# Patient Record
Sex: Female | Born: 1945
Health system: Southern US, Community
[De-identification: ages and names within clinical notes are randomized; demographics above are authoritative.]

## PROBLEM LIST (undated history)

## (undated) DIAGNOSIS — Z9289 Personal history of other medical treatment: Secondary | ICD-10-CM

## (undated) DIAGNOSIS — J189 Pneumonia, unspecified organism: Secondary | ICD-10-CM

## (undated) DIAGNOSIS — M549 Dorsalgia, unspecified: Secondary | ICD-10-CM

## (undated) DIAGNOSIS — M1990 Unspecified osteoarthritis, unspecified site: Secondary | ICD-10-CM

## (undated) DIAGNOSIS — G8929 Other chronic pain: Secondary | ICD-10-CM

## (undated) DIAGNOSIS — Z860100 Personal history of colon polyps, unspecified: Secondary | ICD-10-CM

## (undated) DIAGNOSIS — D649 Anemia, unspecified: Secondary | ICD-10-CM

## (undated) DIAGNOSIS — E039 Hypothyroidism, unspecified: Secondary | ICD-10-CM

## (undated) DIAGNOSIS — G629 Polyneuropathy, unspecified: Secondary | ICD-10-CM

## (undated) DIAGNOSIS — H269 Unspecified cataract: Secondary | ICD-10-CM

## (undated) DIAGNOSIS — K275 Chronic or unspecified peptic ulcer, site unspecified, with perforation: Secondary | ICD-10-CM

## (undated) DIAGNOSIS — M1712 Unilateral primary osteoarthritis, left knee: Secondary | ICD-10-CM

## (undated) DIAGNOSIS — R51 Headache: Secondary | ICD-10-CM

## (undated) DIAGNOSIS — H409 Unspecified glaucoma: Secondary | ICD-10-CM

## (undated) DIAGNOSIS — R519 Headache, unspecified: Secondary | ICD-10-CM

## (undated) HISTORY — PX: OTHER SURGICAL HISTORY: SHX169

## (undated) HISTORY — PX: COLONOSCOPY WITH ESOPHAGOGASTRODUODENOSCOPY (EGD): SHX5779

## (undated) HISTORY — PX: EPIDURAL BLOCK INJECTION: SHX1516

## (undated) HISTORY — PX: EYE SURGERY: SHX253

## (undated) HISTORY — PX: SPINE SURGERY: SHX786

## (undated) HISTORY — DX: Unspecified cataract: H26.9

## (undated) HISTORY — PX: JOINT REPLACEMENT: SHX530

---
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
## 1963-10-29 HISTORY — PX: APPENDECTOMY: SHX54

## 1963-10-29 HISTORY — PX: OVARIAN CYST REMOVAL: SHX89

## 1971-10-29 HISTORY — PX: ABDOMINAL HYSTERECTOMY: SHX81

## 2001-01-26 ENCOUNTER — Other Ambulatory Visit: Admission: RE | Admit: 2001-01-26 | Discharge: 2001-01-26 | Payer: Self-pay | Admitting: Family Medicine

## 2008-04-12 ENCOUNTER — Encounter: Admission: RE | Admit: 2008-04-12 | Discharge: 2008-04-12 | Payer: Self-pay | Admitting: Orthopedic Surgery

## 2008-06-09 ENCOUNTER — Encounter: Admission: RE | Admit: 2008-06-09 | Discharge: 2008-06-09 | Payer: Self-pay | Admitting: Orthopedic Surgery

## 2008-06-22 ENCOUNTER — Encounter: Admission: RE | Admit: 2008-06-22 | Discharge: 2008-06-22 | Payer: Self-pay | Admitting: Orthopedic Surgery

## 2009-08-22 ENCOUNTER — Encounter: Admission: RE | Admit: 2009-08-22 | Discharge: 2009-08-22 | Payer: Self-pay | Admitting: Orthopedic Surgery

## 2009-09-12 ENCOUNTER — Encounter: Admission: RE | Admit: 2009-09-12 | Discharge: 2009-09-12 | Payer: Self-pay | Admitting: Orthopedic Surgery

## 2010-02-02 ENCOUNTER — Encounter: Admission: RE | Admit: 2010-02-02 | Discharge: 2010-02-02 | Payer: Self-pay | Admitting: Orthopedic Surgery

## 2010-02-07 ENCOUNTER — Encounter: Admission: RE | Admit: 2010-02-07 | Discharge: 2010-02-07 | Payer: Self-pay | Admitting: Orthopedic Surgery

## 2010-06-28 ENCOUNTER — Encounter: Admission: RE | Admit: 2010-06-28 | Discharge: 2010-06-28 | Payer: Self-pay | Admitting: Family Medicine

## 2010-11-18 ENCOUNTER — Encounter: Payer: Self-pay | Admitting: Orthopedic Surgery

## 2010-12-13 ENCOUNTER — Other Ambulatory Visit: Payer: Self-pay | Admitting: Orthopedic Surgery

## 2010-12-13 DIAGNOSIS — M4310 Spondylolisthesis, site unspecified: Secondary | ICD-10-CM

## 2010-12-13 DIAGNOSIS — M47819 Spondylosis without myelopathy or radiculopathy, site unspecified: Secondary | ICD-10-CM

## 2010-12-14 ENCOUNTER — Other Ambulatory Visit: Payer: Self-pay | Admitting: Orthopedic Surgery

## 2010-12-14 ENCOUNTER — Ambulatory Visit
Admission: RE | Admit: 2010-12-14 | Discharge: 2010-12-14 | Disposition: A | Discharge status: Home / Self Care | Payer: Medicare Other | Source: Ambulatory Visit | Attending: Orthopedic Surgery | Admitting: Orthopedic Surgery

## 2010-12-14 DIAGNOSIS — M48061 Spinal stenosis, lumbar region without neurogenic claudication: Secondary | ICD-10-CM

## 2010-12-14 DIAGNOSIS — M47819 Spondylosis without myelopathy or radiculopathy, site unspecified: Secondary | ICD-10-CM

## 2010-12-14 DIAGNOSIS — M4310 Spondylolisthesis, site unspecified: Secondary | ICD-10-CM

## 2010-12-17 ENCOUNTER — Other Ambulatory Visit: Payer: Self-pay | Admitting: Orthopedic Surgery

## 2010-12-17 DIAGNOSIS — M479 Spondylosis, unspecified: Secondary | ICD-10-CM

## 2010-12-31 ENCOUNTER — Other Ambulatory Visit: Payer: Medicare Other

## 2011-09-10 ENCOUNTER — Other Ambulatory Visit: Payer: Self-pay | Admitting: Orthopedic Surgery

## 2011-09-10 DIAGNOSIS — M4310 Spondylolisthesis, site unspecified: Secondary | ICD-10-CM

## 2011-09-10 DIAGNOSIS — M47819 Spondylosis without myelopathy or radiculopathy, site unspecified: Secondary | ICD-10-CM

## 2011-09-10 DIAGNOSIS — M48061 Spinal stenosis, lumbar region without neurogenic claudication: Secondary | ICD-10-CM

## 2011-09-11 ENCOUNTER — Inpatient Hospital Stay: Admission: RE | Admit: 2011-09-11 | Payer: Medicare Other | Source: Ambulatory Visit

## 2011-09-13 ENCOUNTER — Ambulatory Visit
Admission: RE | Admit: 2011-09-13 | Discharge: 2011-09-13 | Disposition: A | Discharge status: Home / Self Care | Payer: Medicare Other | Source: Ambulatory Visit | Attending: Orthopedic Surgery | Admitting: Orthopedic Surgery

## 2011-09-13 DIAGNOSIS — M47819 Spondylosis without myelopathy or radiculopathy, site unspecified: Secondary | ICD-10-CM

## 2011-09-13 DIAGNOSIS — M48061 Spinal stenosis, lumbar region without neurogenic claudication: Secondary | ICD-10-CM

## 2011-09-13 DIAGNOSIS — M4310 Spondylolisthesis, site unspecified: Secondary | ICD-10-CM

## 2011-09-13 MED ORDER — METHYLPREDNISOLONE ACETATE 40 MG/ML INJ SUSP (RADIOLOG
120.0000 mg | Freq: Once | INTRAMUSCULAR | Status: AC
  Administered 2011-09-13: 120 mg via EPIDURAL

## 2011-09-13 MED ORDER — IOHEXOL 180 MG/ML  SOLN
1.0000 mL | Freq: Once | INTRAMUSCULAR | Status: AC | PRN
  Administered 2011-09-13: 1 mL via EPIDURAL

## 2011-09-13 NOTE — Patient Instructions (Signed)

## 2012-02-20 ENCOUNTER — Other Ambulatory Visit: Payer: Self-pay | Admitting: Orthopedic Surgery

## 2012-02-20 DIAGNOSIS — M549 Dorsalgia, unspecified: Secondary | ICD-10-CM

## 2012-02-21 ENCOUNTER — Ambulatory Visit
Admission: RE | Admit: 2012-02-21 | Discharge: 2012-02-21 | Disposition: A | Discharge status: Home / Self Care | Payer: Medicare Other | Source: Ambulatory Visit | Attending: Orthopedic Surgery | Admitting: Orthopedic Surgery

## 2012-02-21 DIAGNOSIS — M549 Dorsalgia, unspecified: Secondary | ICD-10-CM

## 2012-02-21 MED ORDER — IOHEXOL 180 MG/ML  SOLN
1.0000 mL | Freq: Once | INTRAMUSCULAR | Status: AC | PRN
  Administered 2012-02-21: 1 mL via EPIDURAL

## 2012-02-21 MED ORDER — METHYLPREDNISOLONE ACETATE 40 MG/ML INJ SUSP (RADIOLOG
120.0000 mg | Freq: Once | INTRAMUSCULAR | Status: AC
  Administered 2012-02-21: 120 mg via EPIDURAL

## 2012-04-22 ENCOUNTER — Other Ambulatory Visit: Payer: Self-pay | Admitting: Orthopedic Surgery

## 2012-04-22 DIAGNOSIS — M545 Low back pain: Secondary | ICD-10-CM

## 2012-04-22 DIAGNOSIS — M4800 Spinal stenosis, site unspecified: Secondary | ICD-10-CM

## 2012-05-01 ENCOUNTER — Ambulatory Visit
Admission: RE | Admit: 2012-05-01 | Discharge: 2012-05-01 | Disposition: A | Discharge status: Home / Self Care | Payer: Medicare Other | Source: Ambulatory Visit | Attending: Orthopedic Surgery | Admitting: Orthopedic Surgery

## 2012-05-01 VITALS — BP 119/77 | HR 72

## 2012-05-01 DIAGNOSIS — M4800 Spinal stenosis, site unspecified: Secondary | ICD-10-CM

## 2012-05-01 DIAGNOSIS — M545 Low back pain: Secondary | ICD-10-CM

## 2012-05-01 MED ORDER — METHYLPREDNISOLONE ACETATE 40 MG/ML INJ SUSP (RADIOLOG
120.0000 mg | Freq: Once | INTRAMUSCULAR | Status: AC
  Administered 2012-05-01: 120 mg via EPIDURAL

## 2012-05-01 MED ORDER — IOHEXOL 180 MG/ML  SOLN
1.0000 mL | Freq: Once | INTRAMUSCULAR | Status: AC | PRN
  Administered 2012-05-01: 1 mL via EPIDURAL

## 2012-08-03 ENCOUNTER — Other Ambulatory Visit: Payer: Self-pay | Admitting: Orthopedic Surgery

## 2012-08-03 DIAGNOSIS — M549 Dorsalgia, unspecified: Secondary | ICD-10-CM

## 2012-08-06 ENCOUNTER — Ambulatory Visit
Admission: RE | Admit: 2012-08-06 | Discharge: 2012-08-06 | Disposition: A | Discharge status: Home / Self Care | Payer: Medicare Other | Source: Ambulatory Visit | Attending: Orthopedic Surgery | Admitting: Orthopedic Surgery

## 2012-08-06 VITALS — BP 140/55 | HR 66

## 2012-08-06 DIAGNOSIS — M549 Dorsalgia, unspecified: Secondary | ICD-10-CM

## 2012-08-06 MED ORDER — IOHEXOL 180 MG/ML  SOLN
1.0000 mL | Freq: Once | INTRAMUSCULAR | Status: AC | PRN
  Administered 2012-08-06: 1 mL via EPIDURAL

## 2012-08-06 MED ORDER — METHYLPREDNISOLONE ACETATE 40 MG/ML INJ SUSP (RADIOLOG
120.0000 mg | Freq: Once | INTRAMUSCULAR | Status: AC
  Administered 2012-08-06: 120 mg via EPIDURAL

## 2012-09-28 ENCOUNTER — Ambulatory Visit: Payer: Medicare Other | Attending: Orthopedic Surgery | Admitting: Physical Therapy

## 2012-09-28 DIAGNOSIS — M545 Low back pain, unspecified: Secondary | ICD-10-CM | POA: Insufficient documentation

## 2012-09-28 DIAGNOSIS — M542 Cervicalgia: Secondary | ICD-10-CM | POA: Insufficient documentation

## 2012-09-28 DIAGNOSIS — M629 Disorder of muscle, unspecified: Secondary | ICD-10-CM | POA: Insufficient documentation

## 2012-09-28 DIAGNOSIS — IMO0001 Reserved for inherently not codable concepts without codable children: Secondary | ICD-10-CM | POA: Insufficient documentation

## 2012-09-28 DIAGNOSIS — M2569 Stiffness of other specified joint, not elsewhere classified: Secondary | ICD-10-CM | POA: Insufficient documentation

## 2012-09-28 DIAGNOSIS — M2420 Disorder of ligament, unspecified site: Secondary | ICD-10-CM | POA: Insufficient documentation

## 2012-09-30 ENCOUNTER — Ambulatory Visit: Payer: Medicare Other | Admitting: Physical Therapy

## 2012-10-06 ENCOUNTER — Ambulatory Visit: Payer: Medicare Other | Admitting: Physical Therapy

## 2012-10-08 ENCOUNTER — Ambulatory Visit: Payer: Medicare Other | Admitting: Physical Therapy

## 2012-10-13 ENCOUNTER — Ambulatory Visit: Payer: Medicare Other | Admitting: Physical Therapy

## 2012-10-15 ENCOUNTER — Ambulatory Visit: Payer: Medicare Other | Admitting: Physical Therapy

## 2012-10-20 ENCOUNTER — Ambulatory Visit: Payer: Medicare Other | Admitting: Physical Therapy

## 2012-10-22 ENCOUNTER — Ambulatory Visit: Payer: Medicare Other | Admitting: Physical Therapy

## 2012-10-29 ENCOUNTER — Ambulatory Visit: Payer: Medicare Other | Attending: Orthopedic Surgery | Admitting: Physical Therapy

## 2012-10-29 DIAGNOSIS — M545 Low back pain, unspecified: Secondary | ICD-10-CM | POA: Insufficient documentation

## 2012-10-29 DIAGNOSIS — M542 Cervicalgia: Secondary | ICD-10-CM | POA: Insufficient documentation

## 2012-10-29 DIAGNOSIS — M2420 Disorder of ligament, unspecified site: Secondary | ICD-10-CM | POA: Insufficient documentation

## 2012-10-29 DIAGNOSIS — M2569 Stiffness of other specified joint, not elsewhere classified: Secondary | ICD-10-CM | POA: Insufficient documentation

## 2012-10-29 DIAGNOSIS — IMO0001 Reserved for inherently not codable concepts without codable children: Secondary | ICD-10-CM | POA: Insufficient documentation

## 2012-10-29 DIAGNOSIS — M629 Disorder of muscle, unspecified: Secondary | ICD-10-CM | POA: Insufficient documentation

## 2012-11-03 ENCOUNTER — Ambulatory Visit: Payer: Medicare Other | Attending: Orthopedic Surgery | Admitting: Physical Therapy

## 2012-11-03 DIAGNOSIS — M545 Low back pain, unspecified: Secondary | ICD-10-CM | POA: Insufficient documentation

## 2012-11-03 DIAGNOSIS — IMO0001 Reserved for inherently not codable concepts without codable children: Secondary | ICD-10-CM | POA: Insufficient documentation

## 2012-11-03 DIAGNOSIS — M2569 Stiffness of other specified joint, not elsewhere classified: Secondary | ICD-10-CM | POA: Insufficient documentation

## 2012-11-03 DIAGNOSIS — M542 Cervicalgia: Secondary | ICD-10-CM | POA: Insufficient documentation

## 2012-11-03 DIAGNOSIS — M2420 Disorder of ligament, unspecified site: Secondary | ICD-10-CM | POA: Insufficient documentation

## 2012-11-03 DIAGNOSIS — M629 Disorder of muscle, unspecified: Secondary | ICD-10-CM | POA: Insufficient documentation

## 2012-11-10 ENCOUNTER — Ambulatory Visit: Payer: Medicare Other | Admitting: Physical Therapy

## 2012-11-17 ENCOUNTER — Encounter: Payer: Medicare Other | Admitting: Physical Therapy

## 2012-11-24 ENCOUNTER — Encounter: Payer: Medicare Other | Admitting: Physical Therapy

## 2014-01-26 ENCOUNTER — Other Ambulatory Visit: Payer: Self-pay | Admitting: Orthopedic Surgery

## 2014-01-26 DIAGNOSIS — M549 Dorsalgia, unspecified: Secondary | ICD-10-CM

## 2014-01-31 ENCOUNTER — Other Ambulatory Visit: Payer: Commercial Managed Care - HMO

## 2014-02-10 ENCOUNTER — Ambulatory Visit
Admission: RE | Admit: 2014-02-10 | Discharge: 2014-02-10 | Disposition: A | Discharge status: Home / Self Care | Payer: Commercial Managed Care - HMO | Source: Ambulatory Visit | Attending: Orthopedic Surgery | Admitting: Orthopedic Surgery

## 2014-02-10 DIAGNOSIS — M549 Dorsalgia, unspecified: Secondary | ICD-10-CM

## 2014-02-10 MED ORDER — METHYLPREDNISOLONE ACETATE 40 MG/ML INJ SUSP (RADIOLOG
120.0000 mg | Freq: Once | INTRAMUSCULAR | Status: AC
  Administered 2014-02-10: 120 mg via EPIDURAL

## 2014-02-10 MED ORDER — IOHEXOL 180 MG/ML  SOLN
1.0000 mL | Freq: Once | INTRAMUSCULAR | Status: AC | PRN
  Administered 2014-02-10: 1 mL via EPIDURAL

## 2014-02-10 NOTE — Discharge Instructions (Signed)

## 2014-06-01 ENCOUNTER — Other Ambulatory Visit: Payer: Self-pay | Admitting: Orthopedic Surgery

## 2014-06-01 DIAGNOSIS — M545 Low back pain: Secondary | ICD-10-CM

## 2014-06-07 ENCOUNTER — Ambulatory Visit
Admission: RE | Admit: 2014-06-07 | Discharge: 2014-06-07 | Disposition: A | Discharge status: Home / Self Care | Payer: Commercial Managed Care - HMO | Source: Ambulatory Visit | Attending: Orthopedic Surgery | Admitting: Orthopedic Surgery

## 2014-06-07 DIAGNOSIS — M545 Low back pain: Secondary | ICD-10-CM

## 2014-06-07 MED ORDER — METHYLPREDNISOLONE ACETATE 40 MG/ML INJ SUSP (RADIOLOG
120.0000 mg | Freq: Once | INTRAMUSCULAR | Status: AC
Start: 2014-06-07 — End: 2014-06-07
  Administered 2014-06-07: 120 mg via EPIDURAL

## 2014-06-07 MED ORDER — IOHEXOL 180 MG/ML  SOLN
1.0000 mL | Freq: Once | INTRAMUSCULAR | Status: AC | PRN
  Administered 2014-06-07: 1 mL via EPIDURAL

## 2014-06-07 NOTE — Discharge Instructions (Signed)

## 2014-07-06 ENCOUNTER — Ambulatory Visit: Payer: Medicare HMO | Attending: Family Medicine | Admitting: Physical Therapy

## 2014-07-06 DIAGNOSIS — Z9089 Acquired absence of other organs: Secondary | ICD-10-CM | POA: Diagnosis not present

## 2014-07-06 DIAGNOSIS — M2560 Stiffness of unspecified joint, not elsewhere classified: Secondary | ICD-10-CM | POA: Diagnosis not present

## 2014-07-06 DIAGNOSIS — R5381 Other malaise: Secondary | ICD-10-CM | POA: Insufficient documentation

## 2014-07-06 DIAGNOSIS — E039 Hypothyroidism, unspecified: Secondary | ICD-10-CM | POA: Diagnosis not present

## 2014-07-06 DIAGNOSIS — M542 Cervicalgia: Secondary | ICD-10-CM | POA: Insufficient documentation

## 2014-07-06 DIAGNOSIS — Z90710 Acquired absence of both cervix and uterus: Secondary | ICD-10-CM | POA: Diagnosis not present

## 2014-07-06 DIAGNOSIS — IMO0001 Reserved for inherently not codable concepts without codable children: Secondary | ICD-10-CM | POA: Diagnosis present

## 2014-07-18 ENCOUNTER — Ambulatory Visit: Payer: Medicare HMO | Admitting: Physical Therapy

## 2014-07-18 DIAGNOSIS — IMO0001 Reserved for inherently not codable concepts without codable children: Secondary | ICD-10-CM | POA: Diagnosis not present

## 2014-07-21 ENCOUNTER — Ambulatory Visit: Payer: Medicare HMO | Admitting: Physical Therapy

## 2014-07-21 DIAGNOSIS — IMO0001 Reserved for inherently not codable concepts without codable children: Secondary | ICD-10-CM | POA: Diagnosis not present

## 2014-07-27 ENCOUNTER — Ambulatory Visit: Payer: Medicare HMO | Admitting: Physical Therapy

## 2014-07-27 DIAGNOSIS — IMO0001 Reserved for inherently not codable concepts without codable children: Secondary | ICD-10-CM | POA: Diagnosis not present

## 2014-08-03 ENCOUNTER — Ambulatory Visit: Payer: Medicare HMO | Attending: Family Medicine | Admitting: Physical Therapy

## 2014-08-03 DIAGNOSIS — M2560 Stiffness of unspecified joint, not elsewhere classified: Secondary | ICD-10-CM | POA: Diagnosis not present

## 2014-08-03 DIAGNOSIS — M542 Cervicalgia: Secondary | ICD-10-CM | POA: Diagnosis not present

## 2014-08-03 DIAGNOSIS — Z5189 Encounter for other specified aftercare: Secondary | ICD-10-CM | POA: Insufficient documentation

## 2014-08-03 DIAGNOSIS — Z90710 Acquired absence of both cervix and uterus: Secondary | ICD-10-CM | POA: Insufficient documentation

## 2014-08-03 DIAGNOSIS — E039 Hypothyroidism, unspecified: Secondary | ICD-10-CM | POA: Insufficient documentation

## 2014-08-03 DIAGNOSIS — R5381 Other malaise: Secondary | ICD-10-CM | POA: Insufficient documentation

## 2014-08-03 DIAGNOSIS — Q428 Congenital absence, atresia and stenosis of other parts of large intestine: Secondary | ICD-10-CM | POA: Diagnosis not present

## 2014-08-12 ENCOUNTER — Ambulatory Visit: Payer: Medicare HMO | Admitting: Physical Therapy

## 2014-08-12 DIAGNOSIS — Z5189 Encounter for other specified aftercare: Secondary | ICD-10-CM | POA: Diagnosis not present

## 2014-08-17 ENCOUNTER — Ambulatory Visit: Payer: Medicare HMO | Admitting: Physical Therapy

## 2014-08-22 ENCOUNTER — Ambulatory Visit: Payer: Medicare HMO | Admitting: Physical Therapy

## 2014-08-22 DIAGNOSIS — Z5189 Encounter for other specified aftercare: Secondary | ICD-10-CM | POA: Diagnosis not present

## 2014-09-02 ENCOUNTER — Ambulatory Visit: Payer: Medicare HMO | Attending: Family Medicine | Admitting: Physical Therapy

## 2014-09-02 DIAGNOSIS — M542 Cervicalgia: Secondary | ICD-10-CM | POA: Diagnosis not present

## 2014-09-02 DIAGNOSIS — M2560 Stiffness of unspecified joint, not elsewhere classified: Secondary | ICD-10-CM | POA: Insufficient documentation

## 2014-09-02 DIAGNOSIS — Z90710 Acquired absence of both cervix and uterus: Secondary | ICD-10-CM | POA: Diagnosis not present

## 2014-09-02 DIAGNOSIS — Q428 Congenital absence, atresia and stenosis of other parts of large intestine: Secondary | ICD-10-CM | POA: Insufficient documentation

## 2014-09-02 DIAGNOSIS — R5381 Other malaise: Secondary | ICD-10-CM | POA: Diagnosis not present

## 2014-09-02 DIAGNOSIS — E039 Hypothyroidism, unspecified: Secondary | ICD-10-CM | POA: Diagnosis not present

## 2014-09-02 DIAGNOSIS — Z5189 Encounter for other specified aftercare: Secondary | ICD-10-CM | POA: Diagnosis present

## 2014-09-09 ENCOUNTER — Ambulatory Visit: Payer: Medicare HMO | Admitting: Physical Therapy

## 2014-09-09 DIAGNOSIS — Z5189 Encounter for other specified aftercare: Secondary | ICD-10-CM | POA: Diagnosis not present

## 2014-09-13 ENCOUNTER — Other Ambulatory Visit: Payer: Self-pay | Admitting: Orthopedic Surgery

## 2014-09-13 DIAGNOSIS — M5416 Radiculopathy, lumbar region: Secondary | ICD-10-CM

## 2014-09-21 ENCOUNTER — Ambulatory Visit
Admission: RE | Admit: 2014-09-21 | Discharge: 2014-09-21 | Disposition: A | Discharge status: Home / Self Care | Payer: Commercial Managed Care - HMO | Source: Ambulatory Visit | Attending: Orthopedic Surgery | Admitting: Orthopedic Surgery

## 2014-09-21 DIAGNOSIS — M5416 Radiculopathy, lumbar region: Secondary | ICD-10-CM

## 2014-09-21 MED ORDER — METHYLPREDNISOLONE ACETATE 40 MG/ML INJ SUSP (RADIOLOG
120.0000 mg | Freq: Once | INTRAMUSCULAR | Status: AC
  Administered 2014-09-21: 120 mg via EPIDURAL

## 2014-09-21 MED ORDER — IOHEXOL 180 MG/ML  SOLN
1.0000 mL | Freq: Once | INTRAMUSCULAR | Status: AC | PRN
  Administered 2014-09-21: 1 mL via EPIDURAL

## 2015-04-25 ENCOUNTER — Other Ambulatory Visit: Payer: Self-pay | Admitting: Orthopedic Surgery

## 2015-04-25 DIAGNOSIS — M545 Low back pain: Secondary | ICD-10-CM

## 2015-04-26 ENCOUNTER — Ambulatory Visit
Admission: RE | Admit: 2015-04-26 | Discharge: 2015-04-26 | Disposition: A | Discharge status: Home / Self Care | Payer: Medicare HMO | Source: Ambulatory Visit | Attending: Orthopedic Surgery | Admitting: Orthopedic Surgery

## 2015-04-26 DIAGNOSIS — M545 Low back pain: Secondary | ICD-10-CM

## 2015-04-26 MED ORDER — METHYLPREDNISOLONE ACETATE 40 MG/ML INJ SUSP (RADIOLOG
120.0000 mg | Freq: Once | INTRAMUSCULAR | Status: AC
  Administered 2015-04-26: 120 mg via EPIDURAL

## 2015-04-26 MED ORDER — IOHEXOL 180 MG/ML  SOLN
1.0000 mL | Freq: Once | INTRAMUSCULAR | Status: AC | PRN
  Administered 2015-04-26: 1 mL via EPIDURAL

## 2015-10-13 ENCOUNTER — Other Ambulatory Visit: Payer: Self-pay | Admitting: Nurse Practitioner

## 2015-10-13 DIAGNOSIS — R1011 Right upper quadrant pain: Principal | ICD-10-CM

## 2015-10-13 DIAGNOSIS — G8929 Other chronic pain: Secondary | ICD-10-CM

## 2015-10-25 ENCOUNTER — Ambulatory Visit
Admission: RE | Admit: 2015-10-25 | Discharge: 2015-10-25 | Disposition: A | Discharge status: Home / Self Care | Payer: Medicare HMO | Source: Ambulatory Visit | Attending: Nurse Practitioner | Admitting: Nurse Practitioner

## 2015-10-25 ENCOUNTER — Other Ambulatory Visit: Payer: Medicare HMO

## 2015-10-25 DIAGNOSIS — R1011 Right upper quadrant pain: Principal | ICD-10-CM

## 2015-10-25 DIAGNOSIS — G8929 Other chronic pain: Secondary | ICD-10-CM

## 2016-09-17 ENCOUNTER — Other Ambulatory Visit: Payer: Self-pay | Admitting: Orthopedic Surgery

## 2016-09-17 DIAGNOSIS — M48061 Spinal stenosis, lumbar region without neurogenic claudication: Secondary | ICD-10-CM

## 2016-09-26 ENCOUNTER — Ambulatory Visit
Admission: RE | Admit: 2016-09-26 | Discharge: 2016-09-26 | Disposition: A | Discharge status: Home / Self Care | Payer: Medicare HMO | Source: Ambulatory Visit | Attending: Orthopedic Surgery | Admitting: Orthopedic Surgery

## 2016-09-26 DIAGNOSIS — M48061 Spinal stenosis, lumbar region without neurogenic claudication: Secondary | ICD-10-CM

## 2016-09-26 MED ORDER — IOPAMIDOL (ISOVUE-M 200) INJECTION 41%
1.0000 mL | Freq: Once | INTRAMUSCULAR | Status: AC
  Administered 2016-09-26: 1 mL via EPIDURAL

## 2016-09-26 MED ORDER — METHYLPREDNISOLONE ACETATE 40 MG/ML INJ SUSP (RADIOLOG
120.0000 mg | Freq: Once | INTRAMUSCULAR | Status: AC
  Administered 2016-09-26: 120 mg via EPIDURAL

## 2016-09-26 NOTE — Discharge Instructions (Signed)

## 2016-10-28 HISTORY — PX: COLON SURGERY: SHX602

## 2016-10-29 ENCOUNTER — Other Ambulatory Visit: Payer: Self-pay | Admitting: Orthopedic Surgery

## 2016-10-29 DIAGNOSIS — M48061 Spinal stenosis, lumbar region without neurogenic claudication: Secondary | ICD-10-CM

## 2016-11-08 ENCOUNTER — Ambulatory Visit
Admission: RE | Admit: 2016-11-08 | Discharge: 2016-11-08 | Disposition: A | Discharge status: Home / Self Care | Payer: Medicare HMO | Source: Ambulatory Visit | Attending: Orthopedic Surgery | Admitting: Orthopedic Surgery

## 2016-11-08 DIAGNOSIS — M48061 Spinal stenosis, lumbar region without neurogenic claudication: Secondary | ICD-10-CM

## 2016-11-08 MED ORDER — IOPAMIDOL (ISOVUE-M 200) INJECTION 41%
1.0000 mL | Freq: Once | INTRAMUSCULAR | Status: AC
  Administered 2016-11-08: 1 mL via EPIDURAL

## 2016-11-08 MED ORDER — METHYLPREDNISOLONE ACETATE 40 MG/ML INJ SUSP (RADIOLOG
120.0000 mg | Freq: Once | INTRAMUSCULAR | Status: AC
  Administered 2016-11-08: 120 mg via EPIDURAL

## 2016-11-08 NOTE — Discharge Instructions (Signed)

## 2016-11-15 LAB — HM DEXA SCAN

## 2016-12-23 ENCOUNTER — Encounter: Payer: Self-pay | Admitting: Internal Medicine

## 2016-12-23 LAB — HM COLONOSCOPY

## 2017-01-02 ENCOUNTER — Other Ambulatory Visit: Payer: Self-pay | Admitting: Orthopedic Surgery

## 2017-01-02 DIAGNOSIS — M545 Low back pain, unspecified: Secondary | ICD-10-CM

## 2017-01-10 ENCOUNTER — Other Ambulatory Visit: Payer: Medicare HMO

## 2017-01-11 ENCOUNTER — Ambulatory Visit
Admission: RE | Admit: 2017-01-11 | Discharge: 2017-01-11 | Disposition: A | Discharge status: Home / Self Care | Payer: Medicare HMO | Source: Ambulatory Visit | Attending: Orthopedic Surgery | Admitting: Orthopedic Surgery

## 2017-01-11 DIAGNOSIS — M545 Low back pain, unspecified: Secondary | ICD-10-CM

## 2017-02-20 ENCOUNTER — Other Ambulatory Visit: Payer: Self-pay | Admitting: Neurological Surgery

## 2017-02-25 ENCOUNTER — Encounter (HOSPITAL_COMMUNITY)
Admission: RE | Admit: 2017-02-25 | Discharge: 2017-02-25 | Disposition: A | Discharge status: Home / Self Care | Payer: Medicare HMO | Source: Ambulatory Visit | Attending: Neurological Surgery | Admitting: Neurological Surgery

## 2017-02-25 ENCOUNTER — Encounter (HOSPITAL_COMMUNITY): Payer: Self-pay

## 2017-02-25 DIAGNOSIS — Z981 Arthrodesis status: Secondary | ICD-10-CM | POA: Insufficient documentation

## 2017-02-25 HISTORY — DX: Headache, unspecified: R51.9

## 2017-02-25 HISTORY — DX: Pneumonia, unspecified organism: J18.9

## 2017-02-25 HISTORY — DX: Dorsalgia, unspecified: M54.9

## 2017-02-25 HISTORY — DX: Unspecified glaucoma: H40.9

## 2017-02-25 HISTORY — DX: Polyneuropathy, unspecified: G62.9

## 2017-02-25 HISTORY — DX: Chronic or unspecified peptic ulcer, site unspecified, with perforation: K27.5

## 2017-02-25 HISTORY — DX: Personal history of colonic polyps: Z86.010

## 2017-02-25 HISTORY — DX: Personal history of other medical treatment: Z92.89

## 2017-02-25 HISTORY — DX: Other chronic pain: G89.29

## 2017-02-25 HISTORY — DX: Hypothyroidism, unspecified: E03.9

## 2017-02-25 HISTORY — DX: Personal history of colon polyps, unspecified: Z86.0100

## 2017-02-25 HISTORY — DX: Headache: R51

## 2017-02-25 LAB — CBC
HCT: 45.2 % (ref 36.0–46.0)
Hemoglobin: 15.1 g/dL — ABNORMAL HIGH (ref 12.0–15.0)
MCH: 29.6 pg (ref 26.0–34.0)
MCHC: 33.4 g/dL (ref 30.0–36.0)
MCV: 88.6 fL (ref 78.0–100.0)
PLATELETS: 302 10*3/uL (ref 150–400)
RBC: 5.1 MIL/uL (ref 3.87–5.11)
RDW: 13.8 % (ref 11.5–15.5)
WBC: 9.4 10*3/uL (ref 4.0–10.5)

## 2017-02-25 LAB — SURGICAL PCR SCREEN
MRSA, PCR: NEGATIVE
Staphylococcus aureus: NEGATIVE

## 2017-02-25 LAB — BASIC METABOLIC PANEL
Anion gap: 8 (ref 5–15)
BUN: 10 mg/dL (ref 6–20)
CALCIUM: 9.9 mg/dL (ref 8.9–10.3)
CHLORIDE: 103 mmol/L (ref 101–111)
CO2: 29 mmol/L (ref 22–32)
CREATININE: 0.65 mg/dL (ref 0.44–1.00)
GFR calc non Af Amer: >60 mL/min (ref >60)
GLUCOSE: 99 mg/dL (ref 65–99)
Potassium: 4.1 mmol/L (ref 3.5–5.1)
Sodium: 140 mmol/L (ref 135–145)

## 2017-02-25 LAB — TYPE AND SCREEN
ABO/RH(D): O POS
ANTIBODY SCREEN: NEGATIVE

## 2017-02-25 LAB — ABO/RH: ABO/RH(D): O POS

## 2017-02-25 MED ORDER — CHLORHEXIDINE GLUCONATE CLOTH 2 % EX PADS: 6.0000 | MEDICATED_PAD | Freq: Once | CUTANEOUS | Status: DC

## 2017-02-25 NOTE — Progress Notes (Addendum)
Cardiologist denies  Medical Md is Dr.Jennifer Sabon in [place]  Echo denies  Stress test denies  Heart cath denies

## 2017-02-25 NOTE — Pre-Procedure Instructions (Signed)
MICIAH SHEALY  02/25/2017      [facility] - [place], [place] - [address] [place] [address] Phone: [phone] Fax: [phone]    Your procedure is scheduled on Fri, May 4 @ 7:30 AM  Report to [facility] Admitting at 5:30 AM  Call this number if you have problems the morning of surgery:  [phone]   Remember:  Do not eat food or drink liquids after midnight.  Take these medicines the morning of surgery with A SIP OF WATER Zantac(Ranitidine) and Thyroid(NP Thyroid)              No Goody's,BC's,Aleve,Advil,Motrin,Ibuprofen,Aspirin,Fish Oil,or any Herbal Medications.    Do not wear jewelry, make-up or nail polish.  Do not wear lotions, powders, perfumes, or deoderant.  Do not shave 48 hours prior to surgery.    Do not bring valuables to the hospital.  [facility] is not responsible for any belongings or valuables.  Contacts, dentures or bridgework may not be worn into surgery.  Leave your suitcase in the car.  After surgery it may be brought to your room.  For patients admitted to the hospital, discharge time will be determined by your treatment team.  Patients discharged the day of surgery will not be allowed to drive home.    [facility]  Before surgery, you can play an important role.  Because skin is not sterile, your skin needs to be as free of germs as possible.  You can reduce the number of germs on you skin by washing with CHG (chlorahexidine gluconate) soap before surgery.  CHG is an antiseptic cleaner which kills germs and bonds with the skin to continue killing germs even after washing.  Please DO NOT use if you have an allergy to CHG or antibacterial soaps.  If your skin becomes reddened/irritated stop using the CHG and inform your nurse when you arrive at Short Stay.  Do not shave (including legs and underarms) for at least 48 hours prior to the first CHG  shower.  You may shave your face.  Please follow these instructions carefully:   1.  Shower with CHG Soap the night before surgery and the                                morning of Surgery.  2.  If you choose to wash your hair, wash your hair first as usual with your       normal shampoo.  3.  After you shampoo, rinse your hair and body thoroughly to remove the                      Shampoo.  4.  Use CHG as you would any other liquid soap.  You can apply chg directly       to the skin and wash gently with scrungie or a clean washcloth.  5.  Apply the CHG Soap to your body ONLY FROM THE NECK DOWN.        Do not use on open wounds or open sores.  Avoid contact with your eyes,       ears, mouth and genitals (private parts).  Wash genitals (private parts)       with your normal soap.  6.  Wash thoroughly, paying special attention to the area where your surgery  will be performed.  7.  Thoroughly rinse your body with warm water from the neck down.  8.  DO NOT shower/wash with your normal soap after using and rinsing off       the CHG Soap.  9.  Pat yourself dry with a clean towel.            10.  Wear clean pajamas.            11.  Place clean sheets on your bed the night of your first shower and do not        sleep with pets.  Day of Surgery  Do not apply any lotions/deoderants the morning of surgery.  Please wear clean clothes to the hospital/surgery center.      Please read over the following fact sheets that you were given. Pain Booklet, Coughing and Deep Breathing, MRSA Information and Surgical Site Infection Prevention

## 2017-02-26 ENCOUNTER — Ambulatory Visit (HOSPITAL_COMMUNITY)
Admission: RE | Admit: 2017-02-26 | Discharge: 2017-02-26 | Disposition: A | Discharge status: Home / Self Care | Payer: Medicare HMO | Source: Ambulatory Visit | Attending: Neurological Surgery | Admitting: Neurological Surgery

## 2017-02-26 ENCOUNTER — Other Ambulatory Visit (HOSPITAL_COMMUNITY): Payer: Self-pay | Admitting: Neurological Surgery

## 2017-02-26 DIAGNOSIS — M48061 Spinal stenosis, lumbar region without neurogenic claudication: Secondary | ICD-10-CM | POA: Insufficient documentation

## 2017-02-26 DIAGNOSIS — M2578 Osteophyte, vertebrae: Secondary | ICD-10-CM

## 2017-02-26 DIAGNOSIS — M4727 Other spondylosis with radiculopathy, lumbosacral region: Secondary | ICD-10-CM | POA: Insufficient documentation

## 2017-02-26 DIAGNOSIS — M4316 Spondylolisthesis, lumbar region: Secondary | ICD-10-CM

## 2017-02-27 MED ORDER — CEFAZOLIN SODIUM-DEXTROSE 2-4 GM/100ML-% IV SOLN
2.0000 g | INTRAVENOUS | Status: AC
  Administered 2017-02-28: 2 g via INTRAVENOUS
  Filled 2017-02-27: qty 100

## 2017-02-28 ENCOUNTER — Inpatient Hospital Stay (HOSPITAL_COMMUNITY): Payer: Medicare HMO | Admitting: Anesthesiology

## 2017-02-28 ENCOUNTER — Inpatient Hospital Stay (HOSPITAL_COMMUNITY): Payer: Medicare HMO

## 2017-02-28 ENCOUNTER — Encounter (HOSPITAL_COMMUNITY)
Admission: RE | Disposition: A | Discharge status: Home / Self Care | Payer: Self-pay | Source: Ambulatory Visit | Attending: Neurological Surgery

## 2017-02-28 ENCOUNTER — Inpatient Hospital Stay (HOSPITAL_COMMUNITY)
Admission: RE | Admit: 2017-02-28 | Discharge: 2017-03-02 | DRG: 455 | Disposition: A | Discharge status: Home / Self Care | Payer: Medicare HMO | Source: Ambulatory Visit | Attending: Neurological Surgery | Admitting: Neurological Surgery

## 2017-02-28 ENCOUNTER — Encounter (HOSPITAL_COMMUNITY): Payer: Self-pay | Admitting: Anesthesiology

## 2017-02-28 DIAGNOSIS — M5416 Radiculopathy, lumbar region: Secondary | ICD-10-CM | POA: Diagnosis present

## 2017-02-28 DIAGNOSIS — E039 Hypothyroidism, unspecified: Secondary | ICD-10-CM | POA: Diagnosis present

## 2017-02-28 DIAGNOSIS — M4317 Spondylolisthesis, lumbosacral region: Secondary | ICD-10-CM | POA: Diagnosis present

## 2017-02-28 DIAGNOSIS — M4316 Spondylolisthesis, lumbar region: Secondary | ICD-10-CM | POA: Diagnosis present

## 2017-02-28 DIAGNOSIS — H409 Unspecified glaucoma: Secondary | ICD-10-CM | POA: Diagnosis present

## 2017-02-28 DIAGNOSIS — Z87891 Personal history of nicotine dependence: Secondary | ICD-10-CM

## 2017-02-28 DIAGNOSIS — Z419 Encounter for procedure for purposes other than remedying health state, unspecified: Secondary | ICD-10-CM

## 2017-02-28 HISTORY — PX: APPLICATION OF ROBOTIC ASSISTANCE FOR SPINAL PROCEDURE: SHX6753

## 2017-02-28 HISTORY — PX: ANTERIOR LAT LUMBAR FUSION: SHX1168

## 2017-02-28 HISTORY — PX: LUMBAR PERCUTANEOUS PEDICLE SCREW 1 LEVEL: SHX5560

## 2017-02-28 SURGERY — ANTERIOR LATERAL LUMBAR FUSION 1 LEVEL
Anesthesia: General

## 2017-02-28 MED ORDER — ONDANSETRON HCL 4 MG/2ML IJ SOLN: 4.0000 mg | Freq: Four times a day (QID) | INTRAMUSCULAR | Status: DC | PRN

## 2017-02-28 MED ORDER — PROPOFOL 10 MG/ML IV BOLUS
INTRAVENOUS | Status: AC
  Filled 2017-02-28: qty 20

## 2017-02-28 MED ORDER — OXYCODONE HCL 5 MG/5ML PO SOLN: 5.0000 mg | Freq: Once | ORAL | Status: DC | PRN

## 2017-02-28 MED ORDER — ONDANSETRON HCL 4 MG/2ML IJ SOLN
INTRAMUSCULAR | Status: AC
  Filled 2017-02-28: qty 2

## 2017-02-28 MED ORDER — EPHEDRINE 5 MG/ML INJ
INTRAVENOUS | Status: AC
  Filled 2017-02-28: qty 10

## 2017-02-28 MED ORDER — SUCCINYLCHOLINE CHLORIDE 20 MG/ML IJ SOLN
INTRAMUSCULAR | Status: DC | PRN
  Administered 2017-02-28: 60 mg via INTRAVENOUS

## 2017-02-28 MED ORDER — SODIUM CHLORIDE 0.9 % IR SOLN
Status: DC | PRN
  Administered 2017-02-28: 07:00:00

## 2017-02-28 MED ORDER — PROPOFOL 1000 MG/100ML IV EMUL
INTRAVENOUS | Status: AC
  Filled 2017-02-28: qty 100

## 2017-02-28 MED ORDER — OXYCODONE HCL ER 20 MG PO T12A
20.0000 mg | EXTENDED_RELEASE_TABLET | Freq: Two times a day (BID) | ORAL | Status: DC
  Administered 2017-02-28 – 2017-03-02 (×4): 20 mg via ORAL
  Filled 2017-02-28 (×4): qty 1

## 2017-02-28 MED ORDER — PHENYLEPHRINE 40 MCG/ML (10ML) SYRINGE FOR IV PUSH (FOR BLOOD PRESSURE SUPPORT)
PREFILLED_SYRINGE | INTRAVENOUS | Status: AC
  Filled 2017-02-28: qty 10

## 2017-02-28 MED ORDER — PANTOPRAZOLE SODIUM 40 MG IV SOLR
40.0000 mg | Freq: Every day | INTRAVENOUS | Status: DC
  Administered 2017-02-28: 40 mg via INTRAVENOUS
  Filled 2017-02-28: qty 40

## 2017-02-28 MED ORDER — HYDROMORPHONE HCL 1 MG/ML IJ SOLN
INTRAMUSCULAR | Status: AC
  Administered 2017-02-28: 0.5 mg via INTRAVENOUS
  Filled 2017-02-28: qty 0.5

## 2017-02-28 MED ORDER — HYDROMORPHONE HCL 1 MG/ML IJ SOLN
INTRAMUSCULAR | Status: AC
  Filled 2017-02-28: qty 0.5

## 2017-02-28 MED ORDER — ADULT MULTIVITAMIN W/MINERALS CH
1.0000 | ORAL_TABLET | Freq: Every day | ORAL | Status: DC
  Administered 2017-02-28 – 2017-03-02 (×3): 1 via ORAL
  Filled 2017-02-28 (×3): qty 1

## 2017-02-28 MED ORDER — ACETAMINOPHEN 10 MG/ML IV SOLN
INTRAVENOUS | Status: DC | PRN
  Administered 2017-02-28: 1000 mg via INTRAVENOUS

## 2017-02-28 MED ORDER — SUGAMMADEX SODIUM 200 MG/2ML IV SOLN
INTRAVENOUS | Status: AC
  Filled 2017-02-28: qty 2

## 2017-02-28 MED ORDER — SUGAMMADEX SODIUM 200 MG/2ML IV SOLN
INTRAVENOUS | Status: DC | PRN
Start: 2017-02-28 — End: 2017-02-28
  Administered 2017-02-28: 150 mg via INTRAVENOUS

## 2017-02-28 MED ORDER — PHENOL 1.4 % MT LIQD: 1.0000 | OROMUCOSAL | Status: DC | PRN

## 2017-02-28 MED ORDER — THROMBIN 20000 UNITS EX SOLR
CUTANEOUS | Status: DC | PRN
  Administered 2017-02-28: 09:00:00 via TOPICAL

## 2017-02-28 MED ORDER — DOCUSATE SODIUM 100 MG PO CAPS
100.0000 mg | ORAL_CAPSULE | Freq: Two times a day (BID) | ORAL | Status: DC
  Administered 2017-02-28 – 2017-03-02 (×5): 100 mg via ORAL
  Filled 2017-02-28 (×5): qty 1

## 2017-02-28 MED ORDER — OXYCODONE HCL 5 MG PO TABS
ORAL_TABLET | ORAL | Status: AC
  Filled 2017-02-28: qty 2

## 2017-02-28 MED ORDER — LIDOCAINE 2% (20 MG/ML) 5 ML SYRINGE
INTRAMUSCULAR | Status: AC
  Filled 2017-02-28: qty 5

## 2017-02-28 MED ORDER — CELECOXIB 200 MG PO CAPS
200.0000 mg | ORAL_CAPSULE | Freq: Two times a day (BID) | ORAL | Status: DC
  Administered 2017-02-28 – 2017-03-02 (×4): 200 mg via ORAL
  Filled 2017-02-28 (×4): qty 1

## 2017-02-28 MED ORDER — 0.9 % SODIUM CHLORIDE (POUR BTL) OPTIME
TOPICAL | Status: DC | PRN
  Administered 2017-02-28: 1000 mL

## 2017-02-28 MED ORDER — EPHEDRINE SULFATE 50 MG/ML IJ SOLN
INTRAMUSCULAR | Status: DC | PRN
  Administered 2017-02-28: 10 mg via INTRAVENOUS

## 2017-02-28 MED ORDER — LIDOCAINE-EPINEPHRINE 2 %-1:100000 IJ SOLN
INTRAMUSCULAR | Status: DC | PRN
  Administered 2017-02-28: 23 mL via INTRADERMAL
  Administered 2017-02-28: 7 mL via INTRADERMAL

## 2017-02-28 MED ORDER — PHENYLEPHRINE HCL 10 MG/ML IJ SOLN
INTRAMUSCULAR | Status: DC | PRN
  Administered 2017-02-28 (×3): 80 ug via INTRAVENOUS

## 2017-02-28 MED ORDER — FENTANYL CITRATE (PF) 250 MCG/5ML IJ SOLN
INTRAMUSCULAR | Status: AC
  Filled 2017-02-28: qty 5

## 2017-02-28 MED ORDER — METHOCARBAMOL 750 MG PO TABS
750.0000 mg | ORAL_TABLET | Freq: Four times a day (QID) | ORAL | Status: DC
  Administered 2017-02-28 – 2017-03-02 (×7): 750 mg via ORAL
  Filled 2017-02-28 (×7): qty 1

## 2017-02-28 MED ORDER — BUPIVACAINE LIPOSOME 1.3 % IJ SUSP
20.0000 mL | INTRAMUSCULAR | Status: AC
  Administered 2017-02-28: 20 mL
  Filled 2017-02-28: qty 20

## 2017-02-28 MED ORDER — SODIUM CHLORIDE 0.9 % IJ SOLN
INTRAMUSCULAR | Status: AC
  Filled 2017-02-28: qty 20

## 2017-02-28 MED ORDER — SUCCINYLCHOLINE CHLORIDE 200 MG/10ML IV SOSY
PREFILLED_SYRINGE | INTRAVENOUS | Status: AC
  Filled 2017-02-28: qty 10

## 2017-02-28 MED ORDER — LIDOCAINE-EPINEPHRINE 2 %-1:100000 IJ SOLN
INTRAMUSCULAR | Status: AC
  Filled 2017-02-28: qty 1

## 2017-02-28 MED ORDER — SODIUM CHLORIDE 0.9% FLUSH: 3.0000 mL | INTRAVENOUS | Status: DC | PRN

## 2017-02-28 MED ORDER — GABAPENTIN 300 MG PO CAPS
300.0000 mg | ORAL_CAPSULE | Freq: Three times a day (TID) | ORAL | Status: DC
  Administered 2017-02-28 – 2017-03-02 (×6): 300 mg via ORAL
  Filled 2017-02-28 (×6): qty 1

## 2017-02-28 MED ORDER — RISAQUAD PO CAPS
1.0000 | ORAL_CAPSULE | Freq: Every day | ORAL | Status: DC
  Administered 2017-02-28 – 2017-03-02 (×3): 1 via ORAL
  Filled 2017-02-28 (×4): qty 1

## 2017-02-28 MED ORDER — THROMBIN 20000 UNITS EX SOLR
CUTANEOUS | Status: AC
  Filled 2017-02-28: qty 20000

## 2017-02-28 MED ORDER — SODIUM CHLORIDE 0.9% FLUSH
3.0000 mL | Freq: Two times a day (BID) | INTRAVENOUS | Status: DC
  Administered 2017-02-28 – 2017-03-01 (×3): 3 mL via INTRAVENOUS

## 2017-02-28 MED ORDER — LACTATED RINGERS IV SOLN
INTRAVENOUS | Status: DC | PRN
  Administered 2017-02-28 (×3): via INTRAVENOUS

## 2017-02-28 MED ORDER — PROPOFOL 500 MG/50ML IV EMUL
INTRAVENOUS | Status: DC | PRN
  Administered 2017-02-28: 75 ug/kg/min via INTRAVENOUS

## 2017-02-28 MED ORDER — DEXAMETHASONE SODIUM PHOSPHATE 10 MG/ML IJ SOLN
INTRAMUSCULAR | Status: DC | PRN
  Administered 2017-02-28: 10 mg via INTRAVENOUS

## 2017-02-28 MED ORDER — SODIUM CHLORIDE 0.9 % IV SOLN: 250.0000 mL | INTRAVENOUS | Status: DC

## 2017-02-28 MED ORDER — PROGESTERONE MICRONIZED 100 MG PO CAPS
100.0000 mg | ORAL_CAPSULE | Freq: Every day | ORAL | Status: DC
Start: 2017-02-28 — End: 2017-03-02
  Administered 2017-02-28 – 2017-03-01 (×2): 100 mg via ORAL
  Filled 2017-02-28 (×2): qty 1

## 2017-02-28 MED ORDER — DIAZEPAM 5 MG PO TABS
5.0000 mg | ORAL_TABLET | Freq: Four times a day (QID) | ORAL | Status: DC | PRN
  Administered 2017-02-28 – 2017-03-02 (×3): 5 mg via ORAL
  Filled 2017-02-28 (×4): qty 1

## 2017-02-28 MED ORDER — FENTANYL CITRATE (PF) 100 MCG/2ML IJ SOLN
INTRAMUSCULAR | Status: DC | PRN
  Administered 2017-02-28: 50 ug via INTRAVENOUS
  Administered 2017-02-28 (×2): 100 ug via INTRAVENOUS
  Administered 2017-02-28 (×2): 50 ug via INTRAVENOUS
  Administered 2017-02-28: 100 ug via INTRAVENOUS
  Administered 2017-02-28 (×2): 50 ug via INTRAVENOUS
  Administered 2017-02-28: 100 ug via INTRAVENOUS

## 2017-02-28 MED ORDER — MIDAZOLAM HCL 5 MG/5ML IJ SOLN
INTRAMUSCULAR | Status: DC | PRN
  Administered 2017-02-28 (×2): 1 mg via INTRAVENOUS

## 2017-02-28 MED ORDER — OXYCODONE HCL 5 MG PO TABS: 5.0000 mg | ORAL_TABLET | Freq: Once | ORAL | Status: DC | PRN

## 2017-02-28 MED ORDER — ZOLPIDEM TARTRATE 5 MG PO TABS: 5.0000 mg | ORAL_TABLET | Freq: Every evening | ORAL | Status: DC | PRN

## 2017-02-28 MED ORDER — ONDANSETRON HCL 4 MG/2ML IJ SOLN
INTRAMUSCULAR | Status: DC | PRN
  Administered 2017-02-28: 4 mg via INTRAVENOUS

## 2017-02-28 MED ORDER — ROCURONIUM BROMIDE 100 MG/10ML IV SOLN
INTRAVENOUS | Status: DC | PRN
  Administered 2017-02-28: 50 mg via INTRAVENOUS

## 2017-02-28 MED ORDER — LATANOPROST 0.005 % OP SOLN
1.0000 [drp] | Freq: Every day | OPHTHALMIC | Status: DC
  Administered 2017-03-01: 1 [drp] via OPHTHALMIC
  Filled 2017-02-28: qty 2.5

## 2017-02-28 MED ORDER — SODIUM CHLORIDE 0.9 % IJ SOLN
INTRAMUSCULAR | Status: DC | PRN
  Administered 2017-02-28: 20 mL via INTRAVENOUS

## 2017-02-28 MED ORDER — FENTANYL CITRATE (PF) 100 MCG/2ML IJ SOLN
25.0000 ug | INTRAMUSCULAR | Status: DC | PRN
  Administered 2017-02-28 (×3): 50 ug via INTRAVENOUS

## 2017-02-28 MED ORDER — FAMOTIDINE 20 MG PO TABS
20.0000 mg | ORAL_TABLET | Freq: Two times a day (BID) | ORAL | Status: DC
  Administered 2017-02-28 – 2017-03-02 (×4): 20 mg via ORAL
  Filled 2017-02-28 (×4): qty 1

## 2017-02-28 MED ORDER — HYDROMORPHONE HCL 1 MG/ML IJ SOLN
0.2500 mg | INTRAMUSCULAR | Status: DC | PRN
  Administered 2017-02-28 (×2): 0.5 mg via INTRAVENOUS

## 2017-02-28 MED ORDER — NALTREXONE HCL 50 MG PO TABS: 4.5000 mg | ORAL_TABLET | Freq: Every day | ORAL | Status: DC

## 2017-02-28 MED ORDER — MIDAZOLAM HCL 2 MG/2ML IJ SOLN
INTRAMUSCULAR | Status: AC
  Filled 2017-02-28: qty 2

## 2017-02-28 MED ORDER — BUPIVACAINE-EPINEPHRINE 0.5% -1:200000 IJ SOLN
INTRAMUSCULAR | Status: DC | PRN
  Administered 2017-02-28: 7 mL
  Administered 2017-02-28: 23 mL

## 2017-02-28 MED ORDER — BUPIVACAINE HCL (PF) 0.5 % IJ SOLN
INTRAMUSCULAR | Status: AC
  Filled 2017-02-28: qty 30

## 2017-02-28 MED ORDER — FENTANYL CITRATE (PF) 100 MCG/2ML IJ SOLN
INTRAMUSCULAR | Status: AC
  Administered 2017-02-28: 50 ug via INTRAVENOUS
  Filled 2017-02-28: qty 2

## 2017-02-28 MED ORDER — ROCURONIUM BROMIDE 10 MG/ML (PF) SYRINGE
PREFILLED_SYRINGE | INTRAVENOUS | Status: AC
  Filled 2017-02-28: qty 5

## 2017-02-28 MED ORDER — PHENYLEPHRINE HCL 10 MG/ML IJ SOLN
INTRAMUSCULAR | Status: DC | PRN
  Administered 2017-02-28: 50 ug/min via INTRAVENOUS

## 2017-02-28 MED ORDER — BUPIVACAINE-EPINEPHRINE (PF) 0.5% -1:200000 IJ SOLN
INTRAMUSCULAR | Status: AC
  Filled 2017-02-28: qty 30

## 2017-02-28 MED ORDER — SODIUM CHLORIDE 0.9 % IV SOLN: INTRAVENOUS | Status: DC

## 2017-02-28 MED ORDER — OXYCODONE HCL 5 MG PO TABS
5.0000 mg | ORAL_TABLET | ORAL | Status: DC | PRN
  Administered 2017-02-28 – 2017-03-02 (×6): 10 mg via ORAL
  Filled 2017-02-28 (×6): qty 2

## 2017-02-28 MED ORDER — ACETAMINOPHEN 10 MG/ML IV SOLN
INTRAVENOUS | Status: AC
  Filled 2017-02-28: qty 100

## 2017-02-28 MED ORDER — MENTHOL 3 MG MT LOZG: 1.0000 | LOZENGE | OROMUCOSAL | Status: DC | PRN

## 2017-02-28 MED ORDER — ACETAMINOPHEN 500 MG PO TABS
1000.0000 mg | ORAL_TABLET | Freq: Four times a day (QID) | ORAL | Status: DC
  Administered 2017-02-28 – 2017-03-02 (×6): 1000 mg via ORAL
  Filled 2017-02-28 (×6): qty 2

## 2017-02-28 MED ORDER — THROMBIN 5000 UNITS EX SOLR
CUTANEOUS | Status: AC
  Filled 2017-02-28: qty 10000

## 2017-02-28 MED ORDER — FENTANYL CITRATE (PF) 100 MCG/2ML IJ SOLN
INTRAMUSCULAR | Status: AC
  Filled 2017-02-28: qty 2

## 2017-02-28 MED ORDER — PROPOFOL 10 MG/ML IV BOLUS
INTRAVENOUS | Status: DC | PRN
  Administered 2017-02-28 (×3): 50 mg via INTRAVENOUS

## 2017-02-28 MED ORDER — LIDOCAINE HCL (CARDIAC) 20 MG/ML IV SOLN
INTRAVENOUS | Status: DC | PRN
  Administered 2017-02-28: 40 mg via INTRAVENOUS

## 2017-02-28 MED ORDER — THROMBIN 5000 UNITS EX SOLR
CUTANEOUS | Status: DC | PRN
  Administered 2017-02-28: 07:00:00 via TOPICAL

## 2017-02-28 MED ORDER — BISACODYL 5 MG PO TBEC: 5.0000 mg | DELAYED_RELEASE_TABLET | Freq: Every day | ORAL | Status: DC | PRN

## 2017-02-28 MED ORDER — FLEET ENEMA 7-19 GM/118ML RE ENEM: 1.0000 | ENEMA | Freq: Once | RECTAL | Status: DC | PRN

## 2017-02-28 MED ORDER — DEXAMETHASONE SODIUM PHOSPHATE 10 MG/ML IJ SOLN
INTRAMUSCULAR | Status: AC
  Filled 2017-02-28: qty 1

## 2017-02-28 MED ORDER — ONDANSETRON HCL 4 MG PO TABS: 4.0000 mg | ORAL_TABLET | Freq: Four times a day (QID) | ORAL | Status: DC | PRN

## 2017-02-28 MED ORDER — BUPIVACAINE HCL 0.5 % IJ SOLN
INTRAMUSCULAR | Status: DC | PRN
  Administered 2017-02-28: 2 mL

## 2017-02-28 MED ORDER — BIEST/PROGESTERONE TD CREA: 1.0000 "application " | TOPICAL_CREAM | Freq: Every day | TRANSDERMAL | Status: DC

## 2017-02-28 MED ORDER — THYROID 60 MG PO TABS
60.0000 mg | ORAL_TABLET | Freq: Every day | ORAL | Status: DC
  Administered 2017-03-02: 60 mg via ORAL
  Filled 2017-02-28 (×2): qty 1

## 2017-02-28 MED ORDER — SENNA 8.6 MG PO TABS
1.0000 | ORAL_TABLET | Freq: Two times a day (BID) | ORAL | Status: DC
  Administered 2017-02-28 – 2017-03-02 (×5): 8.6 mg via ORAL
  Filled 2017-02-28 (×5): qty 1

## 2017-02-28 MED ORDER — DIAZEPAM 5 MG PO TABS
ORAL_TABLET | ORAL | Status: AC
  Filled 2017-02-28: qty 1

## 2017-02-28 MED ORDER — CEFAZOLIN SODIUM-DEXTROSE 1-4 GM/50ML-% IV SOLN
1.0000 g | Freq: Three times a day (TID) | INTRAVENOUS | Status: AC
  Administered 2017-02-28 – 2017-03-01 (×2): 1 g via INTRAVENOUS
  Filled 2017-02-28 (×2): qty 50

## 2017-02-28 SURGICAL SUPPLY — 120 items
ADH SKN CLS APL DERMABOND .7 (GAUZE/BANDAGES/DRESSINGS) ×3
APL SKNCLS STERI-STRIP NONHPOA (GAUZE/BANDAGES/DRESSINGS) ×1
BAG DECANTER FOR FLEXI CONT (MISCELLANEOUS) ×3 IMPLANT
BATTALION LLIF ITRADISCAL SHIM (MISCELLANEOUS) ×2
BENZOIN TINCTURE PRP APPL 2/3 (GAUZE/BANDAGES/DRESSINGS) ×3 IMPLANT
BIT DRILL LONG 3.0X30 (BIT) IMPLANT
BIT DRILL LONG 3X80 (BIT) IMPLANT
BIT DRILL LONG 4X80 (BIT) IMPLANT
BIT DRILL SHORT 3.0X30 (BIT) IMPLANT
BIT DRILL SHORT 3X80 (BIT) IMPLANT
BLADE CLIPPER SURG (BLADE) IMPLANT
BLADE SURG 11 STRL SS (BLADE) ×3 IMPLANT
BUR MATCHSTICK NEURO 3.0 LAGG (BURR) ×3 IMPLANT
BUR ROUND FLUTED 5 RND (BURR) ×2 IMPLANT
BUR ROUND FLUTED 5MM RND (BURR) ×1
CABLE BATTALION LLIF LIGHT (MISCELLANEOUS) ×1 IMPLANT
CANISTER SUCT 3000ML PPV (MISCELLANEOUS) ×6 IMPLANT
CARTRIDGE OIL MAESTRO DRILL (MISCELLANEOUS) ×2 IMPLANT
CATH FOLEY 2WAY SLVR  5CC 14FR (CATHETERS) ×2
CATH FOLEY 2WAY SLVR 5CC 14FR (CATHETERS) IMPLANT
CHLORAPREP W/TINT 26ML (MISCELLANEOUS) ×6 IMPLANT
COVER BACK TABLE 24X17X13 BIG (DRAPES) IMPLANT
DECANTER SPIKE VIAL GLASS SM (MISCELLANEOUS) ×6 IMPLANT
DERMABOND ADVANCED (GAUZE/BANDAGES/DRESSINGS) ×6
DERMABOND ADVANCED .7 DNX12 (GAUZE/BANDAGES/DRESSINGS) ×3 IMPLANT
DIFFUSER DRILL AIR PNEUMATIC (MISCELLANEOUS) ×6 IMPLANT
DILATOR INSULATED XL 8X13 (MISCELLANEOUS) ×1 IMPLANT
DISPOSABLE MULTI STAGE LEAD WIRE ×1 IMPLANT
DRAPE C-ARM 42X72 X-RAY (DRAPES) ×9 IMPLANT
DRAPE C-ARMOR (DRAPES) ×3 IMPLANT
DRAPE LAPAROTOMY 100X72X124 (DRAPES) ×3 IMPLANT
DRAPE MICROSCOPE LEICA (MISCELLANEOUS) ×2 IMPLANT
DRAPE POUCH INSTRU U-SHP 10X18 (DRAPES) ×6 IMPLANT
DRAPE PROXIMA HALF (DRAPES) ×2 IMPLANT
DRAPE SHEET LG 3/4 BI-LAMINATE (DRAPES) ×3 IMPLANT
DRAPE SURG 17X23 STRL (DRAPES) ×3 IMPLANT
DRSG OPSITE POSTOP 3X4 (GAUZE/BANDAGES/DRESSINGS) ×6 IMPLANT
ELECT BLADE 4.0 EZ CLEAN MEGAD (MISCELLANEOUS) ×6
ELECT REM PT RETURN 9FT ADLT (ELECTROSURGICAL) ×6
ELECTRODE BLDE 4.0 EZ CLN MEGD (MISCELLANEOUS) IMPLANT
ELECTRODE REM PT RTRN 9FT ADLT (ELECTROSURGICAL) ×2 IMPLANT
FEE INTRAOP MONITOR IMPULS NCS (MISCELLANEOUS) IMPLANT
GAUZE SPONGE 4X4 12PLY STRL (GAUZE/BANDAGES/DRESSINGS) IMPLANT
GAUZE SPONGE 4X4 16PLY XRAY LF (GAUZE/BANDAGES/DRESSINGS) IMPLANT
GLOVE BIO SURGEON STRL SZ7 (GLOVE) IMPLANT
GLOVE BIO SURGEON STRL SZ7.5 (GLOVE) ×4 IMPLANT
GLOVE BIO SURGEON STRL SZ8 (GLOVE) ×2 IMPLANT
GLOVE BIO SURGEON STRL SZ8.5 (GLOVE) ×2 IMPLANT
GLOVE BIOGEL PI IND STRL 7.0 (GLOVE) IMPLANT
GLOVE BIOGEL PI IND STRL 7.5 (GLOVE) ×3 IMPLANT
GLOVE BIOGEL PI INDICATOR 7.0 (GLOVE)
GLOVE BIOGEL PI INDICATOR 7.5 (GLOVE) ×6
GLOVE EXAM NITRILE LRG STRL (GLOVE) IMPLANT
GLOVE EXAM NITRILE XL STR (GLOVE) IMPLANT
GLOVE EXAM NITRILE XS STR PU (GLOVE) IMPLANT
GLOVE SS BIOGEL STRL SZ 7.5 (GLOVE) ×4 IMPLANT
GLOVE SUPERSENSE BIOGEL SZ 7.5 (GLOVE) ×10
GLOVE SURG SS PI 7.0 STRL IVOR (GLOVE) ×8 IMPLANT
GOWN STRL REUS W/ TWL LRG LVL3 (GOWN DISPOSABLE) ×3 IMPLANT
GOWN STRL REUS W/ TWL XL LVL3 (GOWN DISPOSABLE) ×1 IMPLANT
GOWN STRL REUS W/TWL 2XL LVL3 (GOWN DISPOSABLE) IMPLANT
GOWN STRL REUS W/TWL LRG LVL3 (GOWN DISPOSABLE) ×12
GOWN STRL REUS W/TWL XL LVL3 (GOWN DISPOSABLE) ×6
HEMOSTAT POWDER KIT SURGIFOAM (HEMOSTASIS) ×3 IMPLANT
INTRAOP MONITOR FEE IMPULS NCS (MISCELLANEOUS) ×1
INTRAOP MONITOR FEE IMPULSE (MISCELLANEOUS) ×2
KIT BASIN OR (CUSTOM PROCEDURE TRAY) ×6 IMPLANT
KIT INFUSE XX SMALL 0.7CC (Orthopedic Implant) ×2 IMPLANT
KIT ROOM TURNOVER OR (KITS) ×6 IMPLANT
KIT SPINE MAZOR X ROBO DISP (MISCELLANEOUS) ×3 IMPLANT
LEAD WIRE MULTI STAGE DISP (NEUROSURGERY SUPPLIES) ×1 IMPLANT
NDL HYPO 21X1.5 SAFETY (NEEDLE) ×3 IMPLANT
NDL HYPO 25X1 1.5 SAFETY (NEEDLE) ×1 IMPLANT
NEEDLE HYPO 21X1.5 SAFETY (NEEDLE) ×9 IMPLANT
NEEDLE HYPO 25X1 1.5 SAFETY (NEEDLE) ×3 IMPLANT
NS IRRIG 1000ML POUR BTL (IV SOLUTION) ×4 IMPLANT
OIL CARTRIDGE MAESTRO DRILL (MISCELLANEOUS) ×6
PACK LAMINECTOMY NEURO (CUSTOM PROCEDURE TRAY) ×6 IMPLANT
PACK UNIVERSAL I (CUSTOM PROCEDURE TRAY) ×5 IMPLANT
PAD ARMBOARD 7.5X6 YLW CONV (MISCELLANEOUS) ×9 IMPLANT
PATTIES SURGICAL .5X1.5 (GAUZE/BANDAGES/DRESSINGS) ×3 IMPLANT
PIN HEAD 2.5X60MM (PIN) IMPLANT
PROBE BALL TIP STIM 200MM 2.3M (NEUROSURGERY SUPPLIES) ×1 IMPLANT
PUTTY DBM GRAFTON 5CC (Putty) ×1 IMPLANT
ROD TI PRECONT ILLICO 5.5X4 (Rod) ×4 IMPLANT
RUBBERBAND STERILE (MISCELLANEOUS) ×4 IMPLANT
SCREW CANN PA ILLICO 5.5X50 (Screw) ×4 IMPLANT
SCREW CANN PA ILLICO 6.5X50 (Screw) ×4 IMPLANT
SCREW SCHANZ SA 4.0MM (MISCELLANEOUS) IMPLANT
SCREW SET SPINAL STD HEXALOBE (Screw) ×8 IMPLANT
SHIM ITRADISCAL BATTALION LLIF (MISCELLANEOUS) IMPLANT
SPACER BATTALION 22 WIDE 10X50 (Spacer) ×1 IMPLANT
SPONGE LAP 4X18 X RAY DECT (DISPOSABLE) IMPLANT
SPONGE NEURO XRAY DETECT 1X3 (DISPOSABLE) ×3 IMPLANT
SPONGE SURGIFOAM ABS GEL 100 (HEMOSTASIS) ×3 IMPLANT
STAPLER VISISTAT 35W (STAPLE) ×3 IMPLANT
STRIP SURGICAL 1 X 6 IN (GAUZE/BANDAGES/DRESSINGS) IMPLANT
STRIP SURGICAL 1/2 X 6 IN (GAUZE/BANDAGES/DRESSINGS) IMPLANT
STRIP SURGICAL 1/4 X 6 IN (GAUZE/BANDAGES/DRESSINGS) IMPLANT
STRIP SURGICAL 3/4 X 6 IN (GAUZE/BANDAGES/DRESSINGS) IMPLANT
SUT STRATAFIX MNCRL+ 3-0 PS-2 (SUTURE) ×3
SUT STRATAFIX MONOCRYL 3-0 (SUTURE) ×6
SUT VIC AB 0 CT1 18XCR BRD8 (SUTURE) ×2 IMPLANT
SUT VIC AB 0 CT1 8-18 (SUTURE) ×9
SUT VIC AB 1 CT1 18XBRD ANBCTR (SUTURE) ×1 IMPLANT
SUT VIC AB 1 CT1 8-18 (SUTURE) ×6
SUT VIC AB 2-0 CT1 18 (SUTURE) ×7 IMPLANT
SUT VIC AB 3-0 SH 8-18 (SUTURE) ×5 IMPLANT
SUT VIC AB 4-0 PS2 27 (SUTURE) ×3 IMPLANT
SUTURE STRATFX MNCRL+ 3-0 PS-2 (SUTURE) IMPLANT
SYR 20CC LL (SYRINGE) ×3 IMPLANT
SYR 30ML LL (SYRINGE) ×4 IMPLANT
SYRINGE 20CC LL (MISCELLANEOUS) ×4 IMPLANT
TIP BLUNT NITINOL ILLICO 18 (INSTRUMENTS) ×8 IMPLANT
TOWEL GREEN STERILE (TOWEL DISPOSABLE) ×4 IMPLANT
TOWEL GREEN STERILE FF (TOWEL DISPOSABLE) ×6 IMPLANT
TRAY FOLEY W/METER SILVER 16FR (SET/KITS/TRAYS/PACK) ×3 IMPLANT
TUBE CONNECTING 12'X1/4 (SUCTIONS) ×1
TUBE CONNECTING 12X1/4 (SUCTIONS) ×2 IMPLANT
WATER STERILE IRR 1000ML POUR (IV SOLUTION) ×4 IMPLANT

## 2017-02-28 NOTE — Anesthesia Preprocedure Evaluation (Signed)
Anesthesia Evaluation  Patient identified by MRN, date of birth, ID band Patient awake    Reviewed: Allergy & Precautions, H&P , NPO status , Patient's Chart, lab work & pertinent test results  Airway Mallampati: II   Neck ROM: full    Dental   Pulmonary former smoker,    breath sounds clear to auscultation       Cardiovascular negative cardio ROS   Rhythm:regular Rate:Normal     Neuro/Psych  Headaches,  Neuromuscular disease    GI/Hepatic PUD,   Endo/Other  Hypothyroidism   Renal/GU      Musculoskeletal   Abdominal   Peds  Hematology   Anesthesia Other Findings   Reproductive/Obstetrics                             Anesthesia Physical Anesthesia Plan  ASA: II  Anesthesia Plan: General   Post-op Pain Management:    Induction: Intravenous  Airway Management Planned: Oral ETT  Additional Equipment:   Intra-op Plan:   Post-operative Plan: Extubation in OR  Informed Consent: I have reviewed the patients History and Physical, chart, labs and discussed the procedure including the risks, benefits and alternatives for the proposed anesthesia with the patient or authorized representative who has indicated his/her understanding and acceptance.     Plan Discussed with: CRNA, Anesthesiologist and Surgeon  Anesthesia Plan Comments:         Anesthesia Quick Evaluation

## 2017-02-28 NOTE — Anesthesia Procedure Notes (Signed)
Procedure Name: Intubation Date/Time: 02/28/2017 7:46 AM Performed by: Rebekah Chesterfield L Pre-anesthesia Checklist: Patient identified, Emergency Drugs available, Suction available and Patient being monitored Patient Re-evaluated:Patient Re-evaluated prior to inductionOxygen Delivery Method: Circle System Utilized Preoxygenation: Pre-oxygenation with 100% oxygen Intubation Type: IV induction Ventilation: Mask ventilation without difficulty Laryngoscope Size: Mac and 3 Grade View: Grade I Tube type: Oral Tube size: 7.5 mm Number of attempts: 1 Airway Equipment and Method: Stylet and Oral airway Placement Confirmation: ETT inserted through vocal cords under direct vision,  positive ETCO2 and breath sounds checked- equal and bilateral Secured at: 20 cm Tube secured with: Tape Dental Injury: Teeth and Oropharynx as per pre-operative assessment

## 2017-02-28 NOTE — Transfer of Care (Signed)
Immediate Anesthesia Transfer of Care Note  Patient: Ann Bell  Procedure(s) Performed: Procedure(s): Lumbar four-five Transpsoas lateral interbody fusion with Lumbar four-five Percutaneous pedicle screw fixation, posterolateral arthrodesis with  Mazor  (N/A) LUMBAR PERCUTANEOUS PEDICLE SCREW LUMBAR FOUR-FIVE (N/A) APPLICATION OF ROBOTIC ASSISTANCE FOR SPINAL PROCEDURE (N/A)  Patient Location: PACU  Anesthesia Type:General  Level of Consciousness: awake, alert , oriented and patient cooperative  Airway & Oxygen Therapy: Patient Spontanous Breathing and Patient connected to nasal cannula oxygen  Post-op Assessment: Report given to RN, Post -op Vital signs reviewed and stable and Patient moving all extremities X 4  Post vital signs: Reviewed and stable  Last Vitals:  Vitals:   02/28/17 0612  BP: (!) 130/59  Pulse: 63  Resp: 18  Temp: 37.1 C    Last Pain:  Vitals:   02/28/17 0612  TempSrc: Oral         Complications: No apparent anesthesia complications

## 2017-02-28 NOTE — H&P (Signed)
CC: back pain   HPI: Ann Bell is a 71 y.o. female with a long standing history of low back pain with radiation to her legs. She is here for elective transpsoas lateral interbody fusion at L4-5. She feels well today and is ready for surgery. She denies any concerns today.   PMH: Past Medical History:  Diagnosis Date  . Chronic back pain    spondylolisthesis  . Glaucoma   . Headache   . History of blood transfusion    no abnormal reaction noted  . History of colon polyps    benign  . Hypothyroidism    takes NP Thyroid  . Perforated ulcer (HCC)   . Peripheral neuropathy   . Pneumonia    hx of-yrs ago    PSH: Past Surgical History:  Procedure Laterality Date  . ABDOMINAL HYSTERECTOMY    . APPENDECTOMY  1965  . cataract surgery Bilateral   . CESAREAN SECTION     x 2   . COLONOSCOPY WITH ESOPHAGOGASTRODUODENOSCOPY (EGD)    . cyst removed from ovary  1965  . EPIDURAL BLOCK INJECTION     several times  . ganglion cyst removed from throat      SH: Social History  Substance Use Topics  . Smoking status: Former Smoker    Packs/day: 0.50    Years: 20.00    Types: Cigarettes    Quit date: 06/07/1978  . Smokeless tobacco: Never Used  . Alcohol use Yes    MEDS: Prior to Admission medications   Medication Sig Start Date End Date Taking? Authorizing Provider  Estradiol-Estriol-Progesterone (BIEST/PROGESTERONE) CREA Place 1 application onto the skin daily.   Yes Historical Provider, MD  latanoprost (XALATAN) 0.005 % ophthalmic solution Place 1 drop into both eyes at bedtime.   Yes Historical Provider, MD  Multiple Vitamin (MULTIVITAMIN WITH MINERALS) TABS tablet Take 1 tablet by mouth daily.   Yes Historical Provider, MD  NALTREXONE HCL PO Take 4.5 mg by mouth at bedtime.   Yes Historical Provider, MD  PRESCRIPTION MEDICATION Apply 1 application topically 2 (two) times a week. Testosterone 2% compounded cream   Yes Historical Provider, MD  Probiotic Product (ALIGN) 4 MG  CAPS Take 4 mg by mouth daily.   Yes Historical Provider, MD  progesterone (PROMETRIUM) 100 MG capsule Take 100 mg by mouth at bedtime.   Yes Historical Provider, MD  ranitidine (ZANTAC) 75 MG tablet Take 75 mg by mouth daily as needed for heartburn.   Yes Historical Provider, MD  thyroid (NP THYROID) 60 MG tablet Take 60 mg by mouth daily before breakfast.   Yes Historical Provider, MD    ALLERGY: Allergies  Allergen Reactions  . Codeine Nausea And Vomiting    ROS: Review of Systems  Constitutional: Negative.   HENT: Negative.   Eyes: Negative.   Respiratory: Negative.   Cardiovascular: Negative.   Gastrointestinal: Negative.   Genitourinary: Negative.   Musculoskeletal: Positive for back pain and myalgias.  Skin: Negative.   Neurological: Negative.   Endo/Heme/Allergies: Negative.     Vitals:   02/28/17 0612  BP: (!) 130/59  Pulse: 63  Resp: 18  Temp: 98.8 F (37.1 C)   General appearance: WDWN, NAD Eyes: PERRL, Fundoscopic: normal Cardiovascular: Regular rate and rhythm without murmurs, rubs, gallops. No edema or variciosities. Distal pulses normal. Pulmonary: Clear to auscultation Musculoskeletal:     Muscle tone upper extremities: Normal    Muscle tone lower extremities: Normal    Motor exam: Upper Extremities  Deltoid Bicep Tricep Grip  Right 5/5 5/5 5/5 5/5  Left 5/5 5/5 5/5 5/5   Lower Extremity IP Quad PF DF EHL  Right 5/5 5/5 5/5 5/5 5/5  Left 5/5 5/5 5/5 5/5 5/5   Neurological Awake, alert, oriented Memory and concentration grossly intact Speech fluent, appropriate CNII: Visual fields normal CNIII/IV/VI: EOMI CNV: Facial sensation normal CNVII: Symmetric, normal strength CNVIII: Grossly normal CNIX: Normal palate movement CNXI: Trap and SCM strength normal CN XII: Tongue protrusion normal Sensation grossly intact to LT DTR: Normal Coordination (finger/nose & heel/shin): Normal  IMAGING: No new imaging.  IMPRESSION/PLAN - 71 y.o. female  has persistent lumbar radiculopathy which has failed conservative treatment.  She is neurologically intact to objective testing.  I have recommended transpsoas lateral interbody fusion at L4-5 with pedicle screw fixation and posterolateral fusion.  We have discussed the risks, benefits, and alternatives to surgery.  She wishes to proceed.

## 2017-02-28 NOTE — Brief Op Note (Signed)
02/28/2017  11:46 AM  PATIENT:  Ann Bell  72 y.o. female  PRE-OPERATIVE DIAGNOSIS:  Spondylolisthesis, Lumbar region  POST-OPERATIVE DIAGNOSIS:  Spondylolisthesis, Lumbar region  PROCEDURE:  Procedure(s): Lumbar four-five Transpsoas lateral interbody fusion with Lumbar four-five Percutaneous pedicle screw fixation, posterolateral arthrodesis with  Mazor  (N/A) LUMBAR PERCUTANEOUS PEDICLE SCREW LUMBAR FOUR-FIVE (N/A) APPLICATION OF ROBOTIC ASSISTANCE FOR SPINAL PROCEDURE (N/A)  SURGEON:  Surgeon(s) and Role:    * Loura Halt Ditty, MD - Primary  PHYSICIAN ASSISTANT: Cindra Presume, PA-C  ASSISTANTS: Tressie Stalker, MD   ANESTHESIA:   general  EBL:  Total I/O In: 2000 [I.V.:2000] Out: 1120 [Urine:900; Blood:220]  BLOOD ADMINISTERED:none  DRAINS: none   LOCAL MEDICATIONS USED:  Marcaine  SPECIMEN:  No Specimen  DISPOSITION OF SPECIMEN:  N/A  COUNTS:  YES  TOURNIQUET:  * No tourniquets in log *  DICTATION: .Dragon Dictation  PLAN OF CARE: Admit to inpatient   PATIENT DISPOSITION:  PACU - hemodynamically stable.   Delay start of Pharmacological VTE agent (>24hrs) due to surgical blood loss or risk of bleeding: yes

## 2017-03-01 LAB — BASIC METABOLIC PANEL
Anion gap: 11 (ref 5–15)
BUN: 6 mg/dL (ref 6–20)
CHLORIDE: 102 mmol/L (ref 101–111)
CO2: 25 mmol/L (ref 22–32)
Calcium: 8.8 mg/dL — ABNORMAL LOW (ref 8.9–10.3)
Creatinine, Ser: 0.63 mg/dL (ref 0.44–1.00)
GFR calc Af Amer: >60 mL/min (ref >60)
GFR calc non Af Amer: >60 mL/min (ref >60)
GLUCOSE: 104 mg/dL — AB (ref 65–99)
POTASSIUM: 4.1 mmol/L (ref 3.5–5.1)
Sodium: 138 mmol/L (ref 135–145)

## 2017-03-01 LAB — CBC
HCT: 37 % (ref 36.0–46.0)
Hemoglobin: 12.3 g/dL (ref 12.0–15.0)
MCH: 29.3 pg (ref 26.0–34.0)
MCHC: 33.2 g/dL (ref 30.0–36.0)
MCV: 88.1 fL (ref 78.0–100.0)
PLATELETS: 247 10*3/uL (ref 150–400)
RBC: 4.2 MIL/uL (ref 3.87–5.11)
RDW: 13.7 % (ref 11.5–15.5)
WBC: 11.7 10*3/uL — ABNORMAL HIGH (ref 4.0–10.5)

## 2017-03-01 MED ORDER — PANTOPRAZOLE SODIUM 40 MG PO TBEC
40.0000 mg | DELAYED_RELEASE_TABLET | Freq: Every day | ORAL | Status: DC
Start: 2017-03-01 — End: 2017-03-02
  Administered 2017-03-01: 40 mg via ORAL
  Filled 2017-03-01: qty 1

## 2017-03-01 NOTE — Progress Notes (Signed)
Patient foley d/c'd per order.  Patient ambulated twice in hall with walker, brace and standby assist from staff, no complaints.  Continue to monitor patient.

## 2017-03-01 NOTE — Evaluation (Signed)
Physical Therapy Evaluation Patient Details Name: Ann Bell MRN: [id number] DOB: 1946-04-05 Today's Date: 03/01/2017   History of Present Illness  patient is a 71 yo female s/p spinal surgery Lumbar four-five Transpsoas lateral interbody fusion with Lumbar four-five Percutaneous pedicle screw fixation, posterolateral arthrodesis with  Mazor  Clinical Impression  Patient seen for mobility assessment s/p spinal surgery. Mobilize well. Educated patient on precautions, mobility expectations, safety and car transfers. No further acute PT needs. Will sign off.     Follow Up Recommendations No PT follow up    Equipment Recommendations  None recommended by PT    Recommendations for Other Services       Precautions / Restrictions Precautions Precautions: Back Precaution Booklet Issued: Yes (comment) Precaution Comments: verbally reviewed Required Braces or Orthoses: Spinal Brace Spinal Brace: Applied in sitting position Restrictions Weight Bearing Restrictions: No      Mobility  Bed Mobility Overal bed mobility: Modified Independent             General bed mobility comments: increased time no difficulty  Transfers Overall transfer level: Modified independent Equipment used: None             General transfer comment: increased time, no physical assist  Ambulation/Gait Ambulation/Gait assistance: Modified independent (Device/Increase time) Ambulation Distance (Feet): 360 Feet Assistive device: None Gait Pattern/deviations: WFL(Within Functional Limits) Gait velocity: decreased   General Gait Details: modestly decreased gait speed, but steady with ambulation  Stairs Stairs: Yes Stairs assistance: Modified independent (Device/Increase time) Stair Management: One rail Left Number of Stairs: 6 General stair comments: no difficulties  Wheelchair Mobility    Modified Rankin (Stroke Patients Only)       Balance Overall balance assessment: No apparent  balance deficits (not formally assessed)                                           Pertinent Vitals/Pain      Home Living Family/patient expects to be discharged to:: Private residence Living Arrangements: Spouse/significant other Available Help at Discharge: Family Type of Home: House Home Access: Stairs to enter Entrance Stairs-Rails: Can reach both Entrance Stairs-Number of Steps: 4 Home Layout: Two level Home Equipment: None      Prior Function Level of Independence: Independent               Hand Dominance   Dominant Hand: Right    Extremity/Trunk Assessment   Upper Extremity Assessment Upper Extremity Assessment: Overall WFL for tasks assessed    Lower Extremity Assessment Lower Extremity Assessment: Overall WFL for tasks assessed       Communication   Communication: No difficulties  Cognition Arousal/Alertness: Awake/alert Behavior During Therapy: WFL for tasks assessed/performed Overall Cognitive Status: Within Functional Limits for tasks assessed                                        General Comments      Exercises     Assessment/Plan    PT Assessment Patent does not need any further PT services  PT Problem List         PT Treatment Interventions      PT Goals (Current goals can be found in the Care Plan section)  Acute Rehab PT Goals Patient Stated Goal: to go home  PT Goal Formulation: All assessment and education complete, DC therapy    Frequency     Barriers to discharge        Co-evaluation               AM-PAC PT "6 Clicks" Daily Activity  Outcome Measure Difficulty turning over in bed (including adjusting bedclothes, sheets and blankets)?: A Little Difficulty moving from lying on back to sitting on the side of the bed? : A Little Difficulty sitting down on and standing up from a chair with arms (e.g., wheelchair, bedside commode, etc,.)?: A Little Help needed moving to and from  a bed to chair (including a wheelchair)?: A Little Help needed walking in hospital room?: A Little Help needed climbing 3-5 steps with a railing? : A Little 6 Click Score: 18    End of Session   Activity Tolerance: Patient tolerated treatment well Patient left: in chair;with call bell/phone within reach;with family/visitor present Nurse Communication: Mobility status PT Visit Diagnosis: Unsteadiness on feet (R26.81)    Time: 7867-5449 PT Time Calculation (min) (ACUTE ONLY): 20 min   Charges:   PT Evaluation $PT Eval Low Complexity: 1 Procedure     PT G Codes:        Charlotte Crumb, PT DPT  [phone]   Fabio Asa 03/01/2017, 11:03 AM

## 2017-03-01 NOTE — Anesthesia Postprocedure Evaluation (Signed)
Anesthesia Post Note  Patient: Ann Bell  Procedure(s) Performed: Procedure(s) (LRB): Lumbar four-five Transpsoas lateral interbody fusion with Lumbar four-five Percutaneous pedicle screw fixation, posterolateral arthrodesis with  Mazor  (N/A) LUMBAR PERCUTANEOUS PEDICLE SCREW LUMBAR FOUR-FIVE (N/A) APPLICATION OF ROBOTIC ASSISTANCE FOR SPINAL PROCEDURE (N/A)  Patient location during evaluation: PACU Anesthesia Type: General Level of consciousness: awake and alert and patient cooperative Pain management: pain level controlled Vital Signs Assessment: post-procedure vital signs reviewed and stable Respiratory status: spontaneous breathing and respiratory function stable Cardiovascular status: stable Anesthetic complications: no       Last Vitals:  Vitals:   03/01/17 0118 03/01/17 0534  BP: 105/62 105/67  Pulse: 81 78  Resp: 18 16  Temp: 37.1 C 36.8 C    Last Pain:  Vitals:   03/01/17 0634  TempSrc:   PainSc: Asleep                 Rosabel Sermeno S

## 2017-03-01 NOTE — Evaluation (Signed)
Occupational Therapy Evaluation Patient Details Name: Ann Bell MRN: [id number] DOB: 06-Aug-1946 Today's Date: 03/01/2017    History of Present Illness Patient is a 71 yo female s/p spinal surgery: Lumbar four-five transpsoas lateral interbody fusion with lumbar four-five percutaneous pedicle screw fixation, posterolateral arthrodesis with  Mazor.   Clinical Impression   PTA, pt was independent with ADL and functional mobility. Pt currently requires overall supervision and verbal cues for back precautions during ADL. Discussed brace wear schedule and pt demonstrates understanding. Educated pt and husband concerning back precautions during ADL, use of reacher for LB ADL, use of wet wipes for toileting, use of 3-in-1 as shower seat, and 2 cup method for oral care at sink. Additionally educated on home set-up to avoid bending to reach self-care items. Pt and husband verbalize and demonstrate understanding and report no further questions/concerns. No further acute OT needs identified and acute OT will sign off.      Follow Up Recommendations  No OT follow up;Supervision/Assistance - 24 hour    Equipment Recommendations  3 in 1 bedside commode    Recommendations for Other Services       Precautions / Restrictions Precautions Precautions: Back Precaution Booklet Issued: Yes (comment) Precaution Comments: Reviewed handout provided by PT Required Braces or Orthoses: Spinal Brace Spinal Brace: Applied in sitting position Restrictions Weight Bearing Restrictions: No      Mobility Bed Mobility Overal bed mobility: Modified Independent             General bed mobility comments: increased time no difficulty  Transfers Overall transfer level: Modified independent Equipment used: None             General transfer comment: increased time, no physical assist    Balance Overall balance assessment: No apparent balance deficits (not formally assessed)                                          ADL either performed or assessed with clinical judgement   ADL Overall ADL's : Needs assistance/impaired                                       General ADL Comments: Pt requiring general supervision for all ADL with VC's to adhere to back precautions. Educated on compensatory strategies to improve adherance to these including 2 cup method for oral care at sink, use of 3-in-1 as a shower seat and over commode, and dressing techniques. Additionally discussed brace wear schedule.     Vision Baseline Vision/History: No visual deficits Patient Visual Report: No change from baseline Vision Assessment?: No apparent visual deficits     Perception     Praxis      Pertinent Vitals/Pain Pain Assessment: Faces Faces Pain Scale: Hurts a little bit Pain Location: back Pain Descriptors / Indicators: Operative site guarding Pain Intervention(s): Monitored during session;Repositioned     Hand Dominance Right   Extremity/Trunk Assessment Upper Extremity Assessment Upper Extremity Assessment: Overall WFL for tasks assessed   Lower Extremity Assessment Lower Extremity Assessment: Overall WFL for tasks assessed   Cervical / Trunk Assessment Cervical / Trunk Assessment: Other exceptions Cervical / Trunk Exceptions: s/p spinal surgery   Communication Communication Communication: No difficulties   Cognition Arousal/Alertness: Awake/alert Behavior During Therapy: WFL for tasks assessed/performed Overall Cognitive Status: Within  Functional Limits for tasks assessed                                     General Comments       Exercises     Shoulder Instructions      Home Living Family/patient expects to be discharged to:: Private residence Living Arrangements: Spouse/significant other Available Help at Discharge: Family Type of Home: House Home Access: Stairs to enter Secretary/administrator of Steps: 4 Entrance  Stairs-Rails: Can reach both Home Layout: Two level Alternate Level Stairs-Number of Steps: 12 Alternate Level Stairs-Rails: Can reach both Bathroom Shower/Tub: Producer, television/film/video: Handicapped height     Home Equipment: None          Prior Functioning/Environment Level of Independence: Independent                 OT Problem List: Decreased safety awareness;Decreased knowledge of use of DME or AE;Decreased knowledge of precautions;Pain      OT Treatment/Interventions:      OT Goals(Current goals can be found in the care plan section) Acute Rehab OT Goals Patient Stated Goal: to go home OT Goal Formulation: With patient/family Time For Goal Achievement: 03/15/17 Potential to Achieve Goals: Good  OT Frequency:     Barriers to D/C:            Co-evaluation              AM-PAC PT "6 Clicks" Daily Activity     Outcome Measure Help from another person eating meals?: None Help from another person taking care of personal grooming?: A Little Help from another person toileting, which includes using toliet, bedpan, or urinal?: A Little Help from another person bathing (including washing, rinsing, drying)?: A Little Help from another person to put on and taking off regular upper body clothing?: A Little Help from another person to put on and taking off regular lower body clothing?: A Little 6 Click Score: 19   End of Session Equipment Utilized During Treatment: Back brace  Activity Tolerance: Patient tolerated treatment well Patient left: in bed;with family/visitor present  OT Visit Diagnosis: Unsteadiness on feet (R26.81);Pain Pain - Right/Left: Right Pain - part of body: Leg (back)                Time: 4098-1191 OT Time Calculation (min): 20 min Charges:  OT General Charges $OT Visit: 1 Procedure OT Evaluation $OT Eval Moderate Complexity: 1 Procedure G-Codes:     Doristine Section, MS OTR/L  Pager: [phone]   Gottlieb Zuercher A Liviana Mills 03/01/2017,  1:51 PM

## 2017-03-01 NOTE — Progress Notes (Signed)
Patient ID: Ann Bell, female   DOB: May 02, 1946, 71 y.o.   MRN: [id number] Patient doing well significant improved back and leg pain.  Afebrile stable vital signs  Strength out of 5 wound is clean dry and intact  Continue to mobilize throughout the day possible discharge tomorrow

## 2017-03-02 MED ORDER — OXYCODONE HCL ER 20 MG PO T12A: 20.0000 mg | EXTENDED_RELEASE_TABLET | Freq: Two times a day (BID) | ORAL | 0 refills | Status: DC

## 2017-03-02 MED ORDER — DOCUSATE SODIUM 100 MG PO CAPS: 100.0000 mg | ORAL_CAPSULE | Freq: Two times a day (BID) | ORAL | 0 refills | Status: DC

## 2017-03-02 MED ORDER — OXYCODONE HCL 5 MG PO TABS: 5.0000 mg | ORAL_TABLET | ORAL | 0 refills | Status: DC | PRN

## 2017-03-02 MED ORDER — GABAPENTIN 300 MG PO CAPS: 300.0000 mg | ORAL_CAPSULE | Freq: Three times a day (TID) | ORAL | 1 refills | Status: DC

## 2017-03-02 NOTE — Progress Notes (Signed)
Patient ready for discharge to home; discharge instructions given and reviewed; Rx's given. Patient dressed; wearing her back brace; family at bedside to accompany her home; patient discharged out via wheelchair with equipment.

## 2017-03-02 NOTE — Discharge Summary (Signed)
Physician Discharge Summary  Patient ID: Ann Bell MRN: [id number] DOB/AGE: 01-29-1946 71 y.o.  Admit date: 02/28/2017 Discharge date: 03/02/2017  Admission Diagnoses:L4-5 spondylolisthesis, spinal stenosis, lumbago, lumbar radiculopathy  Discharge Diagnoses: The same Active Problems:   Spondylolisthesis of lumbosacral region   Discharged Condition: good  Hospital Course: Dr. Bevely Palmer performed an L4-5 instrumentation and fusion on the patient on 02/28/2017.  The patient's postoperative course was unremarkable. On postoperative day #2 the patient and her husband requested discharge to home. They were given written and oral discharge instructions. All their questions were answered.  Consults: None Significant Diagnostic Studies: None Treatments: L4-5 instrumentation and fusion Discharge Exam: Blood pressure (!) 110/97, pulse 79, temperature 98 F (36.7 C), temperature source Oral, resp. rate 16, weight 61.2 kg (135 lb), SpO2 97 %. The patient is alert and pleasant. She looks well. Her strength is normal in her lower extremities.  Disposition: Home  Discharge Instructions    Call MD for:  difficulty breathing, headache or visual disturbances    Complete by:  As directed    Call MD for:  extreme fatigue    Complete by:  As directed    Call MD for:  hives    Complete by:  As directed    Call MD for:  persistant dizziness or light-headedness    Complete by:  As directed    Call MD for:  persistant nausea and vomiting    Complete by:  As directed    Call MD for:  redness, tenderness, or signs of infection (pain, swelling, redness, odor or green/yellow discharge around incision site)    Complete by:  As directed    Call MD for:  severe uncontrolled pain    Complete by:  As directed    Call MD for:  temperature >100.4    Complete by:  As directed    Diet - low sodium heart healthy    Complete by:  As directed    Discharge instructions    Complete by:  As directed    Call  [phone] for a followup appointment. Take a stool softener while you are using pain medications.   Driving Restrictions    Complete by:  As directed    Do not drive for 2 weeks.   Increase activity slowly    Complete by:  As directed    Lifting restrictions    Complete by:  As directed    Do not lift more than 5 pounds. No excessive bending or twisting.   May shower / Bathe    Complete by:  As directed    He may shower after the pain she is removed 3 days after surgery. Leave the incision alone.   Remove dressing in 24 hours    Complete by:  As directed      Allergies as of 03/02/2017      Reactions   Codeine Nausea And Vomiting      Medication List    STOP taking these medications   NALTREXONE HCL PO   PRESCRIPTION MEDICATION     TAKE these medications   ALIGN 4 MG Caps Take 4 mg by mouth daily.   BIEST/PROGESTERONE Crea Place 1 application onto the skin daily.   docusate sodium 100 MG capsule Commonly known as:  COLACE Take 1 capsule (100 mg total) by mouth 2 (two) times daily.   gabapentin 300 MG capsule Commonly known as:  NEURONTIN Take 1 capsule (300 mg total) by mouth 3 (three) times daily.  latanoprost 0.005 % ophthalmic solution Commonly known as:  XALATAN Place 1 drop into both eyes at bedtime.   multivitamin with minerals Tabs tablet Take 1 tablet by mouth daily.   NP THYROID 60 MG tablet Generic drug:  thyroid Take 60 mg by mouth daily before breakfast.   oxyCODONE 5 MG immediate release tablet Commonly known as:  Oxy IR/ROXICODONE Take 1-2 tablets (5-10 mg total) by mouth every 3 (three) hours as needed for breakthrough pain.   oxyCODONE 20 mg 12 hr tablet Commonly known as:  OXYCONTIN Take 1 tablet (20 mg total) by mouth every 12 (twelve) hours.   progesterone 100 MG capsule Commonly known as:  PROMETRIUM Take 100 mg by mouth at bedtime.   ranitidine 75 MG tablet Commonly known as:  ZANTAC Take 75 mg by mouth daily as needed for  heartburn.        SignedTressie Stalker D 03/02/2017, 10:37 AM

## 2017-03-03 ENCOUNTER — Encounter (HOSPITAL_COMMUNITY): Payer: Self-pay | Admitting: Neurological Surgery

## 2017-03-09 NOTE — Op Note (Addendum)
02/28/2017  10:32 AM  PATIENT:  Ann Bell  71 y.o. female  PRE-OPERATIVE DIAGNOSIS:  Spondylolisthesis L4-5, lumbar radiculopathy  POST-OPERATIVE DIAGNOSIS:  Same  PROCEDURE:  L4-5 transpsoas lumbar interbody fusion; L4-5 percutaneous pedicle screw fixation with posterolateral fusion; use of BMP  SURGEON:  Hulan Saas, MD  ASSISTANTS: Tressie Stalker, MD  ANESTHESIA:   General  DRAINS: None   SPECIMEN:  None  INDICATION FOR PROCEDURE: 71 year old female with L4-5 spondylolisthesis and lumbar radiculopathy.  She did not respond to conservative treatment.  I recommended the above operation.  Patient understood the risks, benefits, and alternatives and potential outcomes and wished to proceed.  PROCEDURE DETAILS: The patient was brought to the operating room.  After smooth induction of general endotracheal anesthesia the patient was placed in the lateral position with the right side up and secured with tape.  Localizing views were taken with fluoroscopy.  The operative site was then prepped and draped in the usual sterile fashion.  The planned incisions were infiltrated with lidocaine with epinephrine.   The skin was sharply incised and soft tissue to the level of the oblique abdominal muscles was carried out with monopolar cautery.  I then bluntly dissected through the oblique muscles with care taken to preserve any nerves.  I encountered encountered transversalis fascia which was bluntly entered and this opened into an easily dissected fat plane consistent with the retroperitoneum.  I inserted the dilator and approached the posterior third of the L4-5 disc space.  I confirmed that I was not in contact with any nerves of the lumbar plexus. A K-wire was passed into the disc space.  The tract was sequentially dilated and then the retractor was introduced.  I incised the disc space and then passed a Cobb elevator along each endplate and penetrated the annulus on the contralateral side.   I then used box cutters, a paddle, and a rasp to complete the discectomy.  A trial spacer was used to determine appropriate graft size and then a PEEK lordotic interbody graft packed with BMP and grafton was inserted.  The retractor was removed after hemostasis was confirmed.   The incision was closed in layers with interrupted vicryl sutures.  The skin was closed with running monocryl stratafix sutures and dermabond.  The patient was then turned prone on an open [place] table.  The skin of the low back was clipped of hair and wiped down with alcohol.  The patient was prepped and draped in the usual sterile fashion.  The [facility] surgical robot was registered to the patient.  Incisions were planned and injected with lidocaine and marcaine with epinephrine.  Two linear incisions were made.  The subcutaneous tissues were opened with monopolar cautery.  Using the  [facility] a fascial knife was used to incise fascia and then the drill guide was placed at each level.  The high speed drill was used to cannulate each pedicle, a reduction tube was placed and then a K-wire was placed through each reduction tube.  Each reduction tube was removed.  The screws were then inserted bilaterally over the K-wires..    Rods were passed through the towers and secured with set screws.  These were tightened with a torque device and the towers were removed.  I used sequential dilation to approach the left L4-5 facet.  Soft tissue was dissected to completely expose the facet.  It was burred flat with the high speed drill.  This was then packed with grafton to  perform posterolateral arthrodesis.  Both incisions were irrigated with bacitracin saline. Vancomycin powder was placed in the wounds.  The wounds were closed in routine anatomic layers with interrupted vicryl sutures.  The skin incisions were closed with running monocryl stratafix suture and sealed with dermabond.  PATIENT DISPOSITION:  PACU - hemodynamically stable.    Delay start of Pharmacological VTE agent (>24hrs) due to surgical blood loss or risk of bleeding:  yes

## 2017-03-10 ENCOUNTER — Emergency Department (HOSPITAL_COMMUNITY): Payer: Medicare HMO

## 2017-03-10 ENCOUNTER — Encounter (HOSPITAL_COMMUNITY): Payer: Self-pay

## 2017-03-10 ENCOUNTER — Observation Stay (HOSPITAL_COMMUNITY): Payer: Medicare HMO

## 2017-03-10 ENCOUNTER — Inpatient Hospital Stay (HOSPITAL_COMMUNITY)
Admission: EM | Admit: 2017-03-10 | Discharge: 2017-03-14 | DRG: 552 | Disposition: A | Discharge status: Home / Self Care | Payer: Medicare HMO | Attending: Family Medicine | Admitting: Family Medicine

## 2017-03-10 DIAGNOSIS — R2681 Unsteadiness on feet: Secondary | ICD-10-CM

## 2017-03-10 DIAGNOSIS — K5903 Drug induced constipation: Secondary | ICD-10-CM | POA: Diagnosis present

## 2017-03-10 DIAGNOSIS — F11259 Opioid dependence with opioid-induced psychotic disorder, unspecified: Secondary | ICD-10-CM

## 2017-03-10 DIAGNOSIS — F1120 Opioid dependence, uncomplicated: Secondary | ICD-10-CM | POA: Insufficient documentation

## 2017-03-10 DIAGNOSIS — R51 Headache: Secondary | ICD-10-CM | POA: Diagnosis not present

## 2017-03-10 DIAGNOSIS — X58XXXA Exposure to other specified factors, initial encounter: Secondary | ICD-10-CM | POA: Diagnosis present

## 2017-03-10 DIAGNOSIS — R531 Weakness: Secondary | ICD-10-CM

## 2017-03-10 DIAGNOSIS — Z981 Arthrodesis status: Secondary | ICD-10-CM

## 2017-03-10 DIAGNOSIS — S161XXA Strain of muscle, fascia and tendon at neck level, initial encounter: Secondary | ICD-10-CM | POA: Diagnosis not present

## 2017-03-10 DIAGNOSIS — E039 Hypothyroidism, unspecified: Secondary | ICD-10-CM | POA: Diagnosis present

## 2017-03-10 DIAGNOSIS — Z87891 Personal history of nicotine dependence: Secondary | ICD-10-CM

## 2017-03-10 DIAGNOSIS — M62838 Other muscle spasm: Secondary | ICD-10-CM | POA: Diagnosis present

## 2017-03-10 DIAGNOSIS — R339 Retention of urine, unspecified: Secondary | ICD-10-CM | POA: Diagnosis present

## 2017-03-10 DIAGNOSIS — T40605A Adverse effect of unspecified narcotics, initial encounter: Secondary | ICD-10-CM | POA: Diagnosis present

## 2017-03-10 DIAGNOSIS — R52 Pain, unspecified: Secondary | ICD-10-CM

## 2017-03-10 DIAGNOSIS — H409 Unspecified glaucoma: Secondary | ICD-10-CM | POA: Diagnosis present

## 2017-03-10 DIAGNOSIS — Z90710 Acquired absence of both cervix and uterus: Secondary | ICD-10-CM

## 2017-03-10 DIAGNOSIS — R519 Headache, unspecified: Secondary | ICD-10-CM | POA: Diagnosis present

## 2017-03-10 DIAGNOSIS — M542 Cervicalgia: Secondary | ICD-10-CM | POA: Diagnosis not present

## 2017-03-10 DIAGNOSIS — D72829 Elevated white blood cell count, unspecified: Secondary | ICD-10-CM | POA: Diagnosis present

## 2017-03-10 DIAGNOSIS — G8929 Other chronic pain: Secondary | ICD-10-CM | POA: Diagnosis present

## 2017-03-10 DIAGNOSIS — E860 Dehydration: Secondary | ICD-10-CM | POA: Diagnosis present

## 2017-03-10 LAB — CBC
HCT: 41.2 % (ref 36.0–46.0)
Hemoglobin: 13.8 g/dL (ref 12.0–15.0)
MCH: 29.7 pg (ref 26.0–34.0)
MCHC: 33.5 g/dL (ref 30.0–36.0)
MCV: 88.6 fL (ref 78.0–100.0)
PLATELETS: 357 10*3/uL (ref 150–400)
RBC: 4.65 MIL/uL (ref 3.87–5.11)
RDW: 13.3 % (ref 11.5–15.5)
WBC: 12.6 10*3/uL — AB (ref 4.0–10.5)

## 2017-03-10 LAB — COMPREHENSIVE METABOLIC PANEL
ALBUMIN: 3.6 g/dL (ref 3.5–5.0)
ALT: 25 U/L (ref 14–54)
AST: 28 U/L (ref 15–41)
Alkaline Phosphatase: 88 U/L (ref 38–126)
Anion gap: 8 (ref 5–15)
BUN: 7 mg/dL (ref 6–20)
CHLORIDE: 102 mmol/L (ref 101–111)
CO2: 26 mmol/L (ref 22–32)
CREATININE: 0.52 mg/dL (ref 0.44–1.00)
Calcium: 9.1 mg/dL (ref 8.9–10.3)
GFR calc Af Amer: >60 mL/min (ref >60)
GLUCOSE: 109 mg/dL — AB (ref 65–99)
POTASSIUM: 4 mmol/L (ref 3.5–5.1)
Sodium: 136 mmol/L (ref 135–145)
Total Bilirubin: 0.9 mg/dL (ref 0.3–1.2)
Total Protein: 6.4 g/dL — ABNORMAL LOW (ref 6.5–8.1)

## 2017-03-10 LAB — CBC WITH DIFFERENTIAL/PLATELET
BASOS ABS: 0 10*3/uL (ref 0.0–0.1)
Basophils Relative: 0 %
Eosinophils Absolute: 0.2 10*3/uL (ref 0.0–0.7)
Eosinophils Relative: 1 %
HCT: 39.2 % (ref 36.0–46.0)
HEMOGLOBIN: 13.3 g/dL (ref 12.0–15.0)
Lymphocytes Relative: 16 %
Lymphs Abs: 2.1 10*3/uL (ref 0.7–4.0)
MCH: 29.7 pg (ref 26.0–34.0)
MCHC: 33.9 g/dL (ref 30.0–36.0)
MCV: 87.5 fL (ref 78.0–100.0)
Monocytes Absolute: 1.4 10*3/uL — ABNORMAL HIGH (ref 0.1–1.0)
Monocytes Relative: 10 %
NEUTROS ABS: 9.5 10*3/uL — AB (ref 1.7–7.7)
NEUTROS PCT: 72 %
Platelets: UNDETERMINED 10*3/uL (ref 150–400)
RBC: 4.48 MIL/uL (ref 3.87–5.11)
RDW: 13.2 % (ref 11.5–15.5)
WBC: 13 10*3/uL — AB (ref 4.0–10.5)

## 2017-03-10 LAB — URINALYSIS, ROUTINE W REFLEX MICROSCOPIC
Bacteria, UA: NONE SEEN
Bilirubin Urine: NEGATIVE
Glucose, UA: NEGATIVE mg/dL
Hgb urine dipstick: NEGATIVE
Ketones, ur: NEGATIVE mg/dL
Nitrite: NEGATIVE
PH: 7 (ref 5.0–8.0)
Protein, ur: NEGATIVE mg/dL
SPECIFIC GRAVITY, URINE: 1.038 — AB (ref 1.005–1.030)

## 2017-03-10 LAB — BASIC METABOLIC PANEL
Anion gap: 9 (ref 5–15)
BUN: 8 mg/dL (ref 6–20)
CALCIUM: 9.2 mg/dL (ref 8.9–10.3)
CHLORIDE: 101 mmol/L (ref 101–111)
CO2: 26 mmol/L (ref 22–32)
CREATININE: 0.59 mg/dL (ref 0.44–1.00)
GFR calc Af Amer: >60 mL/min (ref >60)
GFR calc non Af Amer: >60 mL/min (ref >60)
Glucose, Bld: 127 mg/dL — ABNORMAL HIGH (ref 65–99)
Potassium: 3.6 mmol/L (ref 3.5–5.1)
Sodium: 136 mmol/L (ref 135–145)

## 2017-03-10 LAB — APTT: APTT: 33 s (ref 24–36)

## 2017-03-10 LAB — TSH: TSH: 0.481 u[IU]/mL (ref 0.350–4.500)

## 2017-03-10 LAB — PROTIME-INR
INR: 1.05
PROTHROMBIN TIME: 13.7 s (ref 11.4–15.2)

## 2017-03-10 LAB — TROPONIN I: Troponin I: <0.03 ng/mL (ref <0.03)

## 2017-03-10 LAB — I-STAT CG4 LACTIC ACID, ED: Lactic Acid, Venous: 0.89 mmol/L (ref 0.5–1.9)

## 2017-03-10 LAB — C-REACTIVE PROTEIN: CRP: 9.4 mg/dL — AB (ref <1.0)

## 2017-03-10 MED ORDER — IOPAMIDOL (ISOVUE-370) INJECTION 76%
INTRAVENOUS | Status: AC
  Administered 2017-03-10: 50 mL via INTRAVENOUS
  Filled 2017-03-10: qty 50

## 2017-03-10 MED ORDER — LATANOPROST 0.005 % OP SOLN
1.0000 [drp] | Freq: Every day | OPHTHALMIC | Status: DC
  Administered 2017-03-10 – 2017-03-13 (×4): 1 [drp] via OPHTHALMIC
  Filled 2017-03-10: qty 2.5

## 2017-03-10 MED ORDER — HYDROMORPHONE HCL 1 MG/ML IJ SOLN
1.0000 mg | INTRAMUSCULAR | Status: DC | PRN
  Administered 2017-03-10 – 2017-03-12 (×8): 1 mg via INTRAVENOUS
  Filled 2017-03-10 (×8): qty 1

## 2017-03-10 MED ORDER — GADOBENATE DIMEGLUMINE 529 MG/ML IV SOLN
10.0000 mL | Freq: Once | INTRAVENOUS | Status: AC | PRN
  Administered 2017-03-10: 10 mL via INTRAVENOUS

## 2017-03-10 MED ORDER — RISAQUAD PO CAPS
1.0000 | ORAL_CAPSULE | Freq: Every day | ORAL | Status: DC
  Administered 2017-03-11 – 2017-03-14 (×4): 1 via ORAL
  Filled 2017-03-10 (×5): qty 1

## 2017-03-10 MED ORDER — FENTANYL CITRATE (PF) 100 MCG/2ML IJ SOLN
50.0000 ug | Freq: Once | INTRAMUSCULAR | Status: AC
  Administered 2017-03-10: 50 ug via INTRAVENOUS
  Filled 2017-03-10: qty 2

## 2017-03-10 MED ORDER — ACETAMINOPHEN 325 MG PO TABS
650.0000 mg | ORAL_TABLET | Freq: Four times a day (QID) | ORAL | Status: DC | PRN
  Administered 2017-03-11 – 2017-03-13 (×2): 650 mg via ORAL
  Filled 2017-03-10 (×2): qty 2

## 2017-03-10 MED ORDER — OXYCODONE HCL ER 20 MG PO T12A
20.0000 mg | EXTENDED_RELEASE_TABLET | Freq: Two times a day (BID) | ORAL | Status: DC
  Administered 2017-03-10 – 2017-03-14 (×7): 20 mg via ORAL
  Filled 2017-03-10 (×7): qty 1

## 2017-03-10 MED ORDER — SODIUM CHLORIDE 0.9 % IV SOLN
INTRAVENOUS | Status: AC
  Administered 2017-03-10: 23:00:00 via INTRAVENOUS

## 2017-03-10 MED ORDER — CYCLOBENZAPRINE HCL 10 MG PO TABS
5.0000 mg | ORAL_TABLET | Freq: Three times a day (TID) | ORAL | Status: DC
  Administered 2017-03-10 – 2017-03-12 (×4): 5 mg via ORAL
  Filled 2017-03-10 (×5): qty 1

## 2017-03-10 MED ORDER — OXYCODONE-ACETAMINOPHEN 5-325 MG PO TABS
2.0000 | ORAL_TABLET | Freq: Once | ORAL | Status: AC
  Administered 2017-03-10: 2 via ORAL
  Filled 2017-03-10: qty 2

## 2017-03-10 MED ORDER — THYROID 60 MG PO TABS
60.0000 mg | ORAL_TABLET | Freq: Every day | ORAL | Status: DC
  Administered 2017-03-11 – 2017-03-14 (×4): 60 mg via ORAL
  Filled 2017-03-10 (×5): qty 1

## 2017-03-10 MED ORDER — METHOCARBAMOL 500 MG PO TABS
500.0000 mg | ORAL_TABLET | Freq: Once | ORAL | Status: AC
  Administered 2017-03-10: 500 mg via ORAL
  Filled 2017-03-10: qty 1

## 2017-03-10 MED ORDER — SODIUM CHLORIDE 0.9 % IV BOLUS (SEPSIS)
1000.0000 mL | Freq: Once | INTRAVENOUS | Status: AC
  Administered 2017-03-10: 1000 mL via INTRAVENOUS

## 2017-03-10 MED ORDER — ONDANSETRON HCL 4 MG/2ML IJ SOLN: 4.0000 mg | Freq: Four times a day (QID) | INTRAMUSCULAR | Status: DC | PRN

## 2017-03-10 MED ORDER — DOCUSATE SODIUM 100 MG PO CAPS
200.0000 mg | ORAL_CAPSULE | Freq: Two times a day (BID) | ORAL | Status: DC
  Administered 2017-03-10 – 2017-03-14 (×7): 200 mg via ORAL
  Filled 2017-03-10 (×9): qty 2

## 2017-03-10 MED ORDER — GABAPENTIN 400 MG PO CAPS
400.0000 mg | ORAL_CAPSULE | Freq: Three times a day (TID) | ORAL | Status: DC
  Administered 2017-03-10 – 2017-03-14 (×10): 400 mg via ORAL
  Filled 2017-03-10 (×11): qty 1

## 2017-03-10 MED ORDER — ACETAMINOPHEN 650 MG RE SUPP: 650.0000 mg | Freq: Four times a day (QID) | RECTAL | Status: DC | PRN

## 2017-03-10 MED ORDER — PROGESTERONE MICRONIZED 100 MG PO CAPS
100.0000 mg | ORAL_CAPSULE | Freq: Every day | ORAL | Status: DC
  Administered 2017-03-10 – 2017-03-12 (×3): 100 mg via ORAL
  Filled 2017-03-10 (×5): qty 1

## 2017-03-10 MED ORDER — BIEST/PROGESTERONE TD CREA: 1.0000 "application " | TOPICAL_CREAM | Freq: Every day | TRANSDERMAL | Status: DC

## 2017-03-10 MED ORDER — DICLOFENAC SODIUM 1 % TD GEL
2.0000 g | Freq: Once | TRANSDERMAL | Status: AC
  Administered 2017-03-10: 2 g via TOPICAL
  Filled 2017-03-10: qty 100

## 2017-03-10 MED ORDER — PANTOPRAZOLE SODIUM 40 MG PO TBEC
40.0000 mg | DELAYED_RELEASE_TABLET | Freq: Every day | ORAL | Status: DC
  Administered 2017-03-11 – 2017-03-14 (×4): 40 mg via ORAL
  Filled 2017-03-10 (×4): qty 1

## 2017-03-10 MED ORDER — ADULT MULTIVITAMIN W/MINERALS CH
1.0000 | ORAL_TABLET | Freq: Every day | ORAL | Status: DC
  Administered 2017-03-11 – 2017-03-14 (×4): 1 via ORAL
  Filled 2017-03-10 (×5): qty 1

## 2017-03-10 MED ORDER — ONDANSETRON HCL 4 MG PO TABS: 4.0000 mg | ORAL_TABLET | Freq: Four times a day (QID) | ORAL | Status: DC | PRN

## 2017-03-10 MED ORDER — POLYETHYLENE GLYCOL 3350 17 G PO PACK: 17.0000 g | PACK | Freq: Every day | ORAL | Status: DC | PRN

## 2017-03-10 MED ORDER — ENOXAPARIN SODIUM 40 MG/0.4ML ~~LOC~~ SOLN: 40.0000 mg | SUBCUTANEOUS | Status: DC

## 2017-03-10 NOTE — ED Provider Notes (Signed)
[facility]-EMERGENCY DEPT Provider Note   CSN: [id number] Arrival date & time: 03/10/17  4098     History   Chief Complaint Chief Complaint  Patient presents with  . Neck Pain    HPI Ann Bell is a 71 y.o. female who presents with neck pain and headache. Past medical history significant for chronic low back pain and neck pain. She recently had lumbar fusion of L4-L5 by Dr. Bevely Palmer on May 4. Her postop course was uncomplicated. 3 days ago she started to develop gradually worsening neck pain radiating to the posterior head. Over the weekend she called Dr. Mervyn Gay office who prescribed her a muscle relaxer which she has been taking in addition to prescribed oxycodone 20mg  ER and 5-10mg  IR prn. This is not provided significant relief. This morning the pain was severe and worse with any movement of the neck therefore they called Dr. Mervyn Gay office again who recommended her to come to the ED for evaluation for meningitis. She denies any recent fevers or confusion. She reports dizziness and a headache which is consistent with her prior migraine headaches with photophobia and an aura. Also reports dry mouth and difficulty swallowing but is able to swallow. She denies any numbness or tingling, extremity weakness, loss of bowel or bladder function.  HPI  Past Medical History:  Diagnosis Date  . Chronic back pain    spondylolisthesis  . Glaucoma   . Headache   . History of blood transfusion    no abnormal reaction noted  . History of colon polyps    benign  . Hypothyroidism    takes NP Thyroid  . Perforated ulcer (HCC)   . Peripheral neuropathy   . Pneumonia    hx of-yrs ago    Patient Active Problem List   Diagnosis Date Noted  . Spondylolisthesis of lumbosacral region 02/28/2017    Past Surgical History:  Procedure Laterality Date  . ABDOMINAL HYSTERECTOMY    . ANTERIOR LAT LUMBAR FUSION N/A 02/28/2017   Procedure: Lumbar four-five Transpsoas lateral interbody fusion with Lumbar  four-five Percutaneous pedicle screw fixation, posterolateral arthrodesis with  Mazor ;  Surgeon: Ditty, Loura Halt, MD;  Location: [facility] OR;  Service: Neurosurgery;  Laterality: N/A;  . APPENDECTOMY  1965  . APPLICATION OF ROBOTIC ASSISTANCE FOR SPINAL PROCEDURE N/A 02/28/2017   Procedure: APPLICATION OF ROBOTIC ASSISTANCE FOR SPINAL PROCEDURE;  Surgeon: Ditty, Loura Halt, MD;  Location[facility] OR;  Service: Neurosurgery;  Laterality: N/A;  . cataract surgery Bilateral   . CESAREAN SECTION     x 2   . COLONOSCOPY WITH ESOPHAGOGASTRODUODENOSCOPY (EGD)    . cyst removed from ovary  1965  . EPIDURAL BLOCK INJECTION     several times  . ganglion cyst removed from throat    . LUMBAR PERCUTANEOUS PEDICLE SCREW 1 LEVEL N/A 02/28/2017   Procedure: LUMBAR PERCUTANEOUS PEDICLE SCREW LUMBAR FOUR-FIVE;  Surgeon: Ditty, Loura Halt, MD;  Location: [facility] OR;  Service: Neurosurgery;  Laterality: N/A;    OB History    No data available       Home Medications    Prior to Admission medications   Medication Sig Start Date End Date Taking? Authorizing Provider  cyclobenzaprine (FLEXERIL) 10 MG tablet Take 10 mg by mouth 3 (three) times daily as needed for muscle spasms.   Yes [provider]  docusate sodium (COLACE) 100 MG capsule Take 1 capsule (100 mg total) by mouth 2 (two) times daily. 03/02/17  Yes Tressie Stalker, MD  Estradiol-Estriol-Progesterone (  BIEST/PROGESTERONE) CREA Place 1 application onto the skin daily.   Yes [provider]  gabapentin (NEURONTIN) 300 MG capsule Take 1 capsule (300 mg total) by mouth 3 (three) times daily. 03/02/17  Yes Tressie Stalker, MD  latanoprost (XALATAN) 0.005 % ophthalmic solution Place 1 drop into both eyes at bedtime.   Yes [provider]  Multiple Vitamin (MULTIVITAMIN WITH MINERALS) TABS tablet Take 1 tablet by mouth daily.   Yes [provider]  Nutritional Supplements (DHEA PO) Take 1 capsule by mouth daily.   Yes  [provider]  oxyCODONE (OXY IR/ROXICODONE) 5 MG immediate release tablet Take 1-2 tablets (5-10 mg total) by mouth every 3 (three) hours as needed for breakthrough pain. 03/02/17  Yes Tressie Stalker, MD  oxyCODONE (OXYCONTIN) 20 mg 12 hr tablet Take 1 tablet (20 mg total) by mouth every 12 (twelve) hours. 03/02/17  Yes Tressie Stalker, MD  Probiotic Product (ALIGN) 4 MG CAPS Take 4 mg by mouth daily.   Yes [provider]  progesterone (PROMETRIUM) 100 MG capsule Take 100 mg by mouth at bedtime.   Yes [provider]  ranitidine (ZANTAC) 75 MG tablet Take 75 mg by mouth daily as needed for heartburn.   Yes [provider]  thyroid (NP THYROID) 60 MG tablet Take 60 mg by mouth daily before breakfast.   Yes [provider]    Family History No family history on file.  Social History Social History  Substance Use Topics  . Smoking status: Former Smoker    Packs/day: 0.50    Years: 20.00    Types: Cigarettes    Quit date: 06/07/1978  . Smokeless tobacco: Never Used  . Alcohol use Yes     Allergies   Codeine   Review of Systems Review of Systems  Constitutional: Negative for chills and fever.  HENT: Positive for trouble swallowing.   Eyes: Positive for photophobia and visual disturbance.  Musculoskeletal: Positive for myalgias and neck pain. Negative for back pain and gait problem.  Neurological: Positive for dizziness and headaches. Negative for syncope, weakness and numbness.  All other systems reviewed and are negative.    Physical Exam Updated Vital Signs BP (!) 149/95   Pulse 90   Temp 98.5 F (36.9 C) (Oral)   Resp 19   SpO2 100%   Physical Exam  Constitutional: She is oriented to person, place, and time. She appears well-developed and well-nourished. No distress.  Resting with eyes closed due to photophobia, NAD  HENT:  Head: Normocephalic and atraumatic.  Tenderness over right temple and base of skull  Eyes:  Conjunctivae are normal. Pupils are equal, round, and reactive to light. Right eye exhibits no discharge. Left eye exhibits no discharge. No scleral icterus. Right eye exhibits nystagmus. Left eye exhibits nystagmus.  Small pupils  Neck: Trachea normal. Spinous process tenderness (Tenderness over C1-C2) and muscular tenderness present. Decreased range of motion present.  Extreme pain with minimal ROM  Cardiovascular: Normal rate.   Pulmonary/Chest: Effort normal. No respiratory distress.  Abdominal: She exhibits no distension.  Neurological: She is alert and oriented to person, place, and time.  Pleasant, cooperative. GCS 15. Speaks in a clear voice. Cranial nerves II through XII grossly intact. 5/5 strength in all extremities. Sensation fully intact.  Bilateral finger-to-nose intact.    Skin: Skin is warm and dry.  Psychiatric: She has a normal mood and affect. Her behavior is normal.  Nursing note and vitals reviewed.    ED Treatments /  Results  Labs (all labs ordered are listed, but only abnormal results are displayed) Labs Reviewed  BASIC METABOLIC PANEL - Abnormal; Notable for the following:       Result Value   Glucose, Bld 127 (*)    All other components within normal limits  CBC - Abnormal; Notable for the following:    WBC 12.6 (*)    All other components within normal limits  URINALYSIS, ROUTINE W REFLEX MICROSCOPIC - Abnormal; Notable for the following:    Specific Gravity, Urine 1.038 (*)    Leukocytes, UA TRACE (*)    Squamous Epithelial / LPF 0-5 (*)    Non Squamous Epithelial 0-5 (*)    All other components within normal limits  URINE CULTURE  I-STAT CG4 LACTIC ACID, ED    EKG  EKG Interpretation None       Radiology Ct Head Wo Contrast  Result Date: 03/10/2017 CLINICAL DATA:  71 year old female with 3 days of neck pain. Difficulty swallowing. Recent lumbar surgery. EXAM: CT HEAD WITHOUT CONTRAST CT ANGIOGRAPHY NECK TECHNIQUE: Multidetector CT imaging of  the head without contrast and CTA neck was performed using bolus administration of intravenous contrast for the neck portion. Multiplanar CT image reconstructions and MIPs were obtained to evaluate the vascular anatomy. Carotid stenosis measurements (when applicable) are obtained utilizing NASCET criteria, using the distal internal carotid diameter as the denominator. CONTRAST:  50 mL Isovue 370 COMPARISON:  Cervical spine MRI 12/11/2010 FINDINGS: CT HEAD Brain: No midline shift, ventriculomegaly, mass effect, evidence of mass lesion, intracranial hemorrhage or evidence of cortically based acute infarction. Gray-white matter differentiation is within normal limits throughout the brain. Calvarium and skull base: Negative. No acute osseous abnormality identified. Paranasal sinuses: Clear. Orbits: No acute orbit or scalp soft tissue findings. CTA NECK Skeleton: Visualized skull base is intact. No atlanto-occipital dissociation. Mild degenerative appearing anterolisthesis of C3 on C4 is chronic. Straightening of cervical lordosis. Bilateral posterior element alignment is within normal limits. Chronic disc and endplate degeneration at C5-C6 with mild spinal stenosis. No acute osseous abnormality identified. Upper chest: Negative.  No superior mediastinal lymphadenopathy. Other neck: Thyroid, larynx, pharynx, parapharyngeal spaces, sublingual space, right submandibular gland and parotid glands are within normal limits. The left submandibular gland appears atrophied or surgically absent. Partially retropharyngeal course of the right carotid, otherwise negative retropharyngeal space. No cervical lymphadenopathy. Aortic arch: Negative.  Three vessel arch configuration. Right carotid system: Negative aside from a partially retropharyngeal course. Visible right ICA siphon is patent with minor calcified atherosclerosis. Visible right anterior circulation appears normal. A fetal type right PCA origin is suspected. Left carotid  system: Negative aside from mild tortuosity. Visible left ICA siphon is patent with mild calcified plaque. Visible left anterior circulation is within normal limits; non dominant left A1 segment and fetal type left PCA origins are suspected. Vertebral arteries: No proximal subclavian artery atherosclerosis or stenosis. Tortuous but otherwise negative right vertebral artery. Negative left vertebral artery. The vertebral arteries are fairly codominant. PICA origins are patent. The distal vertebral and basilar arteries appear diminutive, which appears to beyond the basis of fetal type PCA origins. SCA origins are patent. Review of the MIP images confirms the above findings IMPRESSION: 1.  Normal for age non contrast CT appearance of the brain. 2. Negative CTA neck aside from carotid and vertebral artery tortuosity. 3. No acute osseous abnormality identified. Cervical spine degeneration appears not significantly changed since 2012. 4. Negative neck soft tissues aside from chronically atrophied or surgically absent left  submandibular gland. Electronically Signed   By: Odessa Fleming M.D.   On: 03/10/2017 15:16   Ct Angio Neck W And/or Wo Contrast  Result Date: 03/10/2017 CLINICAL DATA:  71 year old female with 3 days of neck pain. Difficulty swallowing. Recent lumbar surgery. EXAM: CT HEAD WITHOUT CONTRAST CT ANGIOGRAPHY NECK TECHNIQUE: Multidetector CT imaging of the head without contrast and CTA neck was performed using bolus administration of intravenous contrast for the neck portion. Multiplanar CT image reconstructions and MIPs were obtained to evaluate the vascular anatomy. Carotid stenosis measurements (when applicable) are obtained utilizing NASCET criteria, using the distal internal carotid diameter as the denominator. CONTRAST:  50 mL Isovue 370 COMPARISON:  Cervical spine MRI 12/11/2010 FINDINGS: CT HEAD Brain: No midline shift, ventriculomegaly, mass effect, evidence of mass lesion, intracranial hemorrhage or  evidence of cortically based acute infarction. Gray-white matter differentiation is within normal limits throughout the brain. Calvarium and skull base: Negative. No acute osseous abnormality identified. Paranasal sinuses: Clear. Orbits: No acute orbit or scalp soft tissue findings. CTA NECK Skeleton: Visualized skull base is intact. No atlanto-occipital dissociation. Mild degenerative appearing anterolisthesis of C3 on C4 is chronic. Straightening of cervical lordosis. Bilateral posterior element alignment is within normal limits. Chronic disc and endplate degeneration at C5-C6 with mild spinal stenosis. No acute osseous abnormality identified. Upper chest: Negative.  No superior mediastinal lymphadenopathy. Other neck: Thyroid, larynx, pharynx, parapharyngeal spaces, sublingual space, right submandibular gland and parotid glands are within normal limits. The left submandibular gland appears atrophied or surgically absent. Partially retropharyngeal course of the right carotid, otherwise negative retropharyngeal space. No cervical lymphadenopathy. Aortic arch: Negative.  Three vessel arch configuration. Right carotid system: Negative aside from a partially retropharyngeal course. Visible right ICA siphon is patent with minor calcified atherosclerosis. Visible right anterior circulation appears normal. A fetal type right PCA origin is suspected. Left carotid system: Negative aside from mild tortuosity. Visible left ICA siphon is patent with mild calcified plaque. Visible left anterior circulation is within normal limits; non dominant left A1 segment and fetal type left PCA origins are suspected. Vertebral arteries: No proximal subclavian artery atherosclerosis or stenosis. Tortuous but otherwise negative right vertebral artery. Negative left vertebral artery. The vertebral arteries are fairly codominant. PICA origins are patent. The distal vertebral and basilar arteries appear diminutive, which appears to beyond the  basis of fetal type PCA origins. SCA origins are patent. Review of the MIP images confirms the above findings IMPRESSION: 1.  Normal for age non contrast CT appearance of the brain. 2. Negative CTA neck aside from carotid and vertebral artery tortuosity. 3. No acute osseous abnormality identified. Cervical spine degeneration appears not significantly changed since 2012. 4. 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 Electronically Signed   By: Odessa Fleming M.D.   On: 03/10/2017 15:16    Procedures Procedures (including critical care time)  Medications Ordered in ED Medications  diclofenac sodium (VOLTAREN) 1 % transdermal gel 2 g (2 g Topical Given 03/10/17 1039)  fentaNYL (SUBLIMAZE) injection 50 mcg (50 mcg Intravenous Given 03/10/17 1045)  fentaNYL (SUBLIMAZE) injection 50 mcg (50 mcg Intravenous Given 03/10/17 1245)  iopamidol (ISOVUE-370) 76 % injection (50 mLs Intravenous Contrast Given 03/10/17 1326)  sodium chloride 0.9 % bolus 1,000 mL (0 mLs Intravenous Stopped 03/10/17 1540)  oxyCODONE-acetaminophen (PERCOCET/ROXICET) 5-325 MG per tablet 2 tablet (2 tablets Oral Given 03/10/17 1540)  methocarbamol (ROBAXIN) tablet 500 mg (500 mg Oral Given 03/10/17 1540)  Initial Impression / Assessment and Plan / ED Course  I have reviewed the triage vital signs and the nursing notes.  Pertinent labs & imaging results that were available during my care of the patient were reviewed by me and considered in my medical decision making (see chart for details).  71 year old female with severe neck pain and headache. She is mildly hypertensive, otherwise vitals are normal. CBC remarkable for leukocytosis of 12.6. BMP remarkable for mild hyperglycemia. Lactic acid is normal. UA remarkable for spec gravity of 1.038. Pt's pain has been very difficult to control. She has taking Oxycodone and Flexeril this morning. She received 2 doses of Fentanyl and  another dose of Percocet and Robaxin and is still unable to move her neck. CT of head and CTA of neck were remarkable for degenerative disc disease but do not show anything acute. She has no weakness on exam. Etiology does not appear infectious at this time therefore LP deferred. Since patient is unable to move due to pain and was previously independent with ADLs will admit for pain control and PT. Spoke with Dr. Laural Benes who will admit. Spoke with Dr. Lovell Sheehan and Dr. Roxy Manns who will see patient in consult.   Final Clinical Impressions(s) / ED Diagnoses   Final diagnoses:  Neck pain  Nonintractable headache, unspecified chronicity pattern, unspecified headache type    New Prescriptions New Prescriptions   No medications on file     Beryle Quant 03/10/17 2147    Lavera Guise, MD 03/13/17 1329

## 2017-03-10 NOTE — ED Triage Notes (Addendum)
Pt arrives EMS with c/o neck pain x 3 days worse today. Lumbar fusion 10 days ago. Sent after contact with MD for eval for meningitis. Pt also c/o swallowing difficulty with EMS.

## 2017-03-10 NOTE — ED Notes (Signed)
Mask placed on pt while in hallway.

## 2017-03-10 NOTE — Progress Notes (Signed)
Patient admitted to 60C09. Patient oriented to room, unit, staff. Patient has 3 honeycomb dressings on back, she states she was told to keep dressings on. No drainage currently. Patient and family oriented to room, unit, staff. Tele set up and ccmd notified

## 2017-03-10 NOTE — ED Notes (Signed)
MRI stated they would take pt up after procedure

## 2017-03-10 NOTE — ED Notes (Signed)
Pt placed on bedpan

## 2017-03-10 NOTE — H&P (Signed)
History and Physical  Ann Bell:[id number] DOB: Mar 02, 1946 DOA: 03/10/2017  Referring physician: Renold Genta PCP: Eliott Nine, MD  Outpatient Specialists:  1. [facility] - Neurosurgery  Chief Complaint: trouble swallowing  HPI: Ann Bell is a 71 y.o. female presented to ED with neck pain and headache about 7 days after having lumbar fusion surgery.  The neck pain radiates to the posterior head and also reports some difficulty swallowing. She reports photophobia and barely able to open eyes due to light sensitivity.  She has never experienced this before.  The pain is at the base of the skull and feels muscular to her but she is not able to tolerate palpation of the area without significant pain and asking the examiner to please stop.  She has some post op weakness in her thighs but no loss of bowel or bladder function and no fever chills nausea or vomiting.   She says she tolerated the lumbar fusion well and had unremarkable postop course until 3 days ago.  Her past medical history is significant for chronic low back pain and neck pain. She had lumbar fusion of L4-L5 by Dr. Bevely Palmer on May 4. Her postop course was unremarkable and she was discharged on 03/02/17. 3 days ago she started to develop gradually worsening neck pain radiating to the posterior head. Not associated with any known trauma, fall or awkward movements.  Over the weekend she called Dr. Mervyn Gay office who prescribed her a muscle relaxer which she has been taking in addition to prescribed oxycodone 20mg  ER and 5-10mg  IR prn. This has not provided significant relief. This morning the pain was severe and worse with any movement of the neck therefore they called Dr. Mervyn Gay office again who recommended her to come to the ED for evaluation for meningitis. She denies any recent fevers or confusion. She reports dizziness and a headache which is consistent with her prior migraine headaches with photophobia and an aura. Also reports dry  mouth and difficulty swallowing but is able to swallow. She denies any numbness or tingling, extremity weakness, loss of bowel or bladder function.  Review of Systems: All systems reviewed and apart from history of presenting illness, are negative.  Past Medical History:  Diagnosis Date  . Chronic back pain    spondylolisthesis  . Glaucoma   . Headache   . History of blood transfusion    no abnormal reaction noted  . History of colon polyps    benign  . Hypothyroidism    takes NP Thyroid  . Perforated ulcer (HCC)   . Peripheral neuropathy   . Pneumonia    hx of-yrs ago   Past Surgical History:  Procedure Laterality Date  . ABDOMINAL HYSTERECTOMY    . ANTERIOR LAT LUMBAR FUSION N/A 02/28/2017   Procedure: Lumbar four-five Transpsoas lateral interbody fusion with Lumbar four-five Percutaneous pedicle screw fixation, posterolateral arthrodesis with  Mazor ;  Surgeon: Ditty, Loura Halt, MD;  Location: [facility] OR;  Service: Neurosurgery;  Laterality: N/A;  . APPENDECTOMY  1965  . APPLICATION OF ROBOTIC ASSISTANCE FOR SPINAL PROCEDURE N/A 02/28/2017   Procedure: APPLICATION OF ROBOTIC ASSISTANCE FOR SPINAL PROCEDURE;  Surgeon: Ditty, Loura Halt, MD;  Location: [facility] OR;  Service: Neurosurgery;  Laterality: N/A;  . cataract surgery Bilateral   . CESAREAN SECTION     x 2   . COLONOSCOPY WITH ESOPHAGOGASTRODUODENOSCOPY (EGD)    . cyst removed from ovary  1965  . EPIDURAL BLOCK INJECTION  several times  . ganglion cyst removed from throat    . LUMBAR PERCUTANEOUS PEDICLE SCREW 1 LEVEL N/A 02/28/2017   Procedure: LUMBAR PERCUTANEOUS PEDICLE SCREW LUMBAR FOUR-FIVE;  Surgeon: Ditty, Loura Halt, MD;  Location: [facility] OR;  Service: Neurosurgery;  Laterality: N/A;   Social History:  reports that she quit smoking about 38 years ago. Her smoking use included Cigarettes. She has a 10.00 pack-year smoking history. She has never used smokeless tobacco. She reports that she drinks alcohol. She  reports that she does not use drugs.   Allergies  Allergen Reactions  . Codeine Nausea And Vomiting    History reviewed. No pertinent family history.   Prior to Admission medications   Medication Sig Start Date End Date Taking? Authorizing Provider  cyclobenzaprine (FLEXERIL) 10 MG tablet Take 10 mg by mouth 3 (three) times daily as needed for muscle spasms.   Yes [provider]  docusate sodium (COLACE) 100 MG capsule Take 1 capsule (100 mg total) by mouth 2 (two) times daily. 03/02/17  Yes Tressie Stalker, MD  Estradiol-Estriol-Progesterone (BIEST/PROGESTERONE) CREA Place 1 application onto the skin daily.   Yes [provider]  gabapentin (NEURONTIN) 300 MG capsule Take 1 capsule (300 mg total) by mouth 3 (three) times daily. 03/02/17  Yes Tressie Stalker, MD  latanoprost (XALATAN) 0.005 % ophthalmic solution Place 1 drop into both eyes at bedtime.   Yes [provider]  Multiple Vitamin (MULTIVITAMIN WITH MINERALS) TABS tablet Take 1 tablet by mouth daily.   Yes [provider]  Nutritional Supplements (DHEA PO) Take 1 capsule by mouth daily.   Yes [provider]  oxyCODONE (OXY IR/ROXICODONE) 5 MG immediate release tablet Take 1-2 tablets (5-10 mg total) by mouth every 3 (three) hours as needed for breakthrough pain. 03/02/17  Yes Tressie Stalker, MD  oxyCODONE (OXYCONTIN) 20 mg 12 hr tablet Take 1 tablet (20 mg total) by mouth every 12 (twelve) hours. 03/02/17  Yes Tressie Stalker, MD  Probiotic Product (ALIGN) 4 MG CAPS Take 4 mg by mouth daily.   Yes [provider]  progesterone (PROMETRIUM) 100 MG capsule Take 100 mg by mouth at bedtime.   Yes [provider]  ranitidine (ZANTAC) 75 MG tablet Take 75 mg by mouth daily as needed for heartburn.   Yes [provider]  thyroid (NP THYROID) 60 MG tablet Take 60 mg by mouth daily before breakfast.   Yes [provider]   Physical Exam: Vitals:   03/10/17  1400 03/10/17 1430 03/10/17 1500 03/10/17 1515  BP: (!) 145/64 (!) 149/95 (!) 144/69   Pulse: 87 90  82  Resp: 19 19  14   Temp:      TempSrc:      SpO2: 99% 100%  100%    General exam: Moderately built and nourished patient, lying flat supine on the gurney in no obvious distress. She is cooperative. She is alert and oriented x 3.    Head, eyes and ENT: Nontraumatic and normocephalic. Eyes closed for most of exam.  Pupils equally reacting to light and accommodation. Oral mucosa dry.   Neck: Supple. With tenderness at base of skull, no masses palpated, No JVD, carotid bruit or thyromegaly. Can touch chin to chest. Significant pain at base of skull unable to tolerate palpation in the area.  Lymphatics: No lymphadenopathy.  Respiratory system: Clear to auscultation. No increased work of breathing.  Cardiovascular system: S1 and S2 heard, RRR. No JVD, murmurs, gallops, clicks or pedal edema.  Gastrointestinal system: Abdomen is nondistended, soft and nontender. Normal bowel sounds heard. No organomegaly or masses appreciated.  Central nervous system: Alert and oriented. No focal neurological deficits.  Extremities: Symmetric 5 x 5 power. Peripheral pulses symmetrically felt.   Skin: No rashes or acute findings.  Musculoskeletal system: Negative exam.  Psychiatry: Pleasant and cooperative.  Labs on Admission:  Basic Metabolic Panel:  Recent Labs Lab 03/10/17 0940  NA 136  K 3.6  CL 101  CO2 26  GLUCOSE 127*  BUN 8  CREATININE 0.59  CALCIUM 9.2   Liver Function Tests: No results for input(s): AST, ALT, ALKPHOS, BILITOT, PROT, ALBUMIN in the last 168 hours. No results for input(s): LIPASE, AMYLASE in the last 168 hours. No results for input(s): AMMONIA in the last 168 hours. CBC:  Recent Labs Lab 03/10/17 0940  WBC 12.6*  HGB 13.8  HCT 41.2  MCV 88.6  PLT 357   Cardiac Enzymes: No results for input(s): CKTOTAL, CKMB, CKMBINDEX, TROPONINI in the last 168  hours.  BNP (last 3 results) No results for input(s): PROBNP in the last 8760 hours. CBG: No results for input(s): GLUCAP in the last 168 hours.  Radiological Exams on Admission: Ct Head Wo Contrast  Result Date: 03/10/2017 CLINICAL DATA:  71 year old female with 3 days of neck pain. Difficulty swallowing. Recent lumbar surgery. EXAM: CT HEAD WITHOUT CONTRAST CT ANGIOGRAPHY NECK TECHNIQUE: Multidetector CT imaging of the head without contrast and CTA neck was performed using bolus administration of intravenous contrast for the neck portion. Multiplanar CT image reconstructions and MIPs were obtained to evaluate the vascular anatomy. Carotid stenosis measurements (when applicable) are obtained utilizing NASCET criteria, using the distal internal carotid diameter as the denominator. CONTRAST:  50 mL Isovue 370 COMPARISON:  Cervical spine MRI 12/11/2010 FINDINGS: CT HEAD Brain: No midline shift, ventriculomegaly, mass effect, evidence of mass lesion, intracranial hemorrhage or evidence of cortically based acute infarction. Gray-white matter differentiation is within normal limits throughout the brain. Calvarium and skull base: Negative. No acute osseous abnormality identified. Paranasal sinuses: Clear. Orbits: No acute orbit or scalp soft tissue findings. CTA NECK Skeleton: Visualized skull base is intact. No atlanto-occipital dissociation. Mild degenerative appearing anterolisthesis of C3 on C4 is chronic. Straightening of cervical lordosis. Bilateral posterior element alignment is within normal limits. Chronic disc and endplate degeneration at C5-C6 with mild spinal stenosis. No acute osseous abnormality identified. Upper chest: Negative.  No superior mediastinal lymphadenopathy. Other neck: Thyroid, larynx, pharynx, parapharyngeal spaces, sublingual space, right submandibular gland and parotid glands are within normal limits. The left submandibular gland appears atrophied or surgically absent. Partially  retropharyngeal course of the right carotid, otherwise negative retropharyngeal space. No cervical lymphadenopathy. Aortic arch: Negative.  Three vessel arch configuration. Right carotid system: Negative aside from a partially retropharyngeal course. Visible right ICA siphon is patent with minor calcified atherosclerosis. Visible right anterior circulation appears normal. A fetal type right PCA origin is suspected. Left carotid system: Negative aside from mild tortuosity. Visible left ICA siphon is patent with mild calcified plaque. Visible left anterior circulation is within normal limits; non dominant left A1 segment and fetal type left PCA origins are suspected. Vertebral arteries: No proximal subclavian artery atherosclerosis or stenosis. Tortuous but otherwise negative right vertebral artery. Negative left vertebral artery. The vertebral arteries are fairly codominant. PICA origins are patent. The distal vertebral and basilar arteries appear diminutive, which appears to beyond the basis of fetal type PCA origins. SCA origins are patent. Review of the MIP  images confirms the above findings IMPRESSION: 1.  Normal for age non contrast CT appearance of the brain. 2. Negative CTA neck aside from carotid and vertebral artery tortuosity. 3. No acute osseous abnormality identified. Cervical spine degeneration appears not significantly changed since 2012. 4. 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 Electronically Signed   By: Odessa Fleming M.D.   On: 03/10/2017 15:16   Ct Angio Neck W And/or Wo Contrast  Result Date: 03/10/2017 CLINICAL DATA:  71 year old female with 3 days of neck pain. Difficulty swallowing. Recent lumbar surgery. EXAM: CT HEAD WITHOUT CONTRAST CT ANGIOGRAPHY NECK TECHNIQUE: Multidetector CT imaging of the head without contrast and CTA neck was performed using bolus administration of intravenous contrast for the neck portion. Multiplanar  CT image reconstructions and MIPs were obtained to evaluate the vascular anatomy. Carotid stenosis measurements (when applicable) are obtained utilizing NASCET criteria, using the distal internal carotid diameter as the denominator. CONTRAST:  50 mL Isovue 370 COMPARISON:  Cervical spine MRI 12/11/2010 FINDINGS: CT HEAD Brain: No midline shift, ventriculomegaly, mass effect, evidence of mass lesion, intracranial hemorrhage or evidence of cortically based acute infarction. Gray-white matter differentiation is within normal limits throughout the brain. Calvarium and skull base: Negative. No acute osseous abnormality identified. Paranasal sinuses: Clear. Orbits: No acute orbit or scalp soft tissue findings. CTA NECK Skeleton: Visualized skull base is intact. No atlanto-occipital dissociation. Mild degenerative appearing anterolisthesis of C3 on C4 is chronic. Straightening of cervical lordosis. Bilateral posterior element alignment is within normal limits. Chronic disc and endplate degeneration at C5-C6 with mild spinal stenosis. No acute osseous abnormality identified. Upper chest: Negative.  No superior mediastinal lymphadenopathy. Other neck: Thyroid, larynx, pharynx, parapharyngeal spaces, sublingual space, right submandibular gland and parotid glands are within normal limits. The left submandibular gland appears atrophied or surgically absent. Partially retropharyngeal course of the right carotid, otherwise negative retropharyngeal space. No cervical lymphadenopathy. Aortic arch: Negative.  Three vessel arch configuration. Right carotid system: Negative aside from a partially retropharyngeal course. Visible right ICA siphon is patent with minor calcified atherosclerosis. Visible right anterior circulation appears normal. A fetal type right PCA origin is suspected. Left carotid system: Negative aside from mild tortuosity. Visible left ICA siphon is patent with mild calcified plaque. Visible left anterior  circulation is within normal limits; non dominant left A1 segment and fetal type left PCA origins are suspected. Vertebral arteries: No proximal subclavian artery atherosclerosis or stenosis. Tortuous but otherwise negative right vertebral artery. Negative left vertebral artery. The vertebral arteries are fairly codominant. PICA origins are patent. The distal vertebral and basilar arteries appear diminutive, which appears to beyond the basis of fetal type PCA origins. SCA origins are patent. Review of the MIP images confirms the above findings IMPRESSION: 1.  Normal for age non contrast CT appearance of the brain. 2. Negative CTA neck aside from carotid and vertebral artery tortuosity. 3. No acute osseous abnormality identified. Cervical spine degeneration appears not significantly changed since 2012. 4. 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 Electronically Signed   By: Odessa Fleming M.D.   On: 03/10/2017 15:16   Assessment/Plan Active Problems:   Intractable headache   Severe Acute on Chronic Neck pain   Opioid dependence (HCC)   Gait instability   Generalized weakness   Leukocytosis   Hypothyroidism   1. Intractable head and neck pain - recently postop lumbar fusion surgery- I requested consultation from neurology  and neurosurgery.  She will likely need a lumbar puncture to rule out infection.  CT head neck negative for acute findings.  MRI head/neck pending.  Meds for pain and muscle spasm ordered.  Further recommendations pending neurology and neurosurgery consults.  Droplet precautions until meningitis ruled out.   2. Headache - could this be an atypical migraine? Further evaluation pending neurology and neurosurgery evaluation.   3. Difficulty ambulation - Get PT evaluation.  Fall precautions.  Assistance with ambulation.  4. Difficulty swallowing - get swallow evaluation.   5. Generalized weakness - PT eval  6. Mild dehydration -  gentle IVF hydration ordered.  7. Leukocytosis - no fever.  Likely will need LP pending neurology evaluation.   8. Hypothyroidism - resume home thyroid replacement.  9. Abnormal Urinalysis - WBC seen in UA, no bacteria.  Pt denies dysuria.  Send urine for culture and sensitivity.    DVT Prophylaxis: lovenox  Code Status: full   Family Communication: bedside (husband)  Disposition Plan: TBD   Time spent: 55 mins  Standley Dakins, MD [facility] Pager [phone]  If 7PM-7AM, please contact night-coverage [email or web address] Password [facility] 03/10/2017, 4:29 PM

## 2017-03-10 NOTE — ED Notes (Signed)
Pt and family made aware of bed assignment 

## 2017-03-10 NOTE — Progress Notes (Signed)
RN paged and asked NP to call radiology re: LP. NP reviewed attending note. Note stated LP pending neurology consult. Therefore, NP paged neurology. Per neurology, their understanding is the LP results will direct consult and further care. NP paged radiologist, Dr. Serafina Royals. She asked if LP needed to be done tonight. NP informed her of neuro awaiting LP and should be done tonight.  KJKG, NP [facility]

## 2017-03-10 NOTE — ED Notes (Signed)
Pt placed back on bedpan

## 2017-03-10 NOTE — ED Notes (Signed)
Report attempted x 1

## 2017-03-10 NOTE — ED Notes (Signed)
Pt states, "I have trouble swallowing. I told Dr. Mervyn Gay office today. My mouth and lips are very dry. It feels like I have a lump in my throat that I cannot clear. I could not take my pills this morning until I used a straw and kept drinking until I got my mouth full and then they finally went down. I can tell something is swollen." no visible oral secretions noted, denies SOB, A&O x4

## 2017-03-10 NOTE — ED Provider Notes (Signed)
Medical screening examination/treatment/procedure(s) were conducted as a shared visit with non-physician practitioner(s) and myself.  I personally evaluated the patient during the encounter.   EKG Interpretation None      71 year old female who presents with neck pain x 3 days radiating to the posterior head. No inciting injury, trauma, exertion, or heavy lifting. No associating numbness, weakness, confusion, fever, chills, vision or speech changes, or recent infectious symptoms. Took oxycodone and muscles relaxants prior to arrival with persistent severe pain.   She is uncomfortable, but non-toxic and in no acute distress. Her vitals are normal aside from mild hypertension. She is neuro in tact. Inability to ROM neck due to pain. Will perform CT head/CTA neck to rule out dissection, mass, arthritis. CT imaging unremarkable. She is afebrile, minimal leukocytosis. Low suspicion for infectious cause. Will discuss with neurosurgery, and admit for ongoing work-up and pain management.   Lavera Guise, MD 03/13/17 1332

## 2017-03-10 NOTE — ED Notes (Signed)
Pt denies pain relief with pain meds, pt taking Flexeril 10  Mg TID, Oxycodone 5-325 mg PRN 1-2 tabs Q3H, Gabapentin 300 mg TID, pts surgeon was Ditty, MD

## 2017-03-11 ENCOUNTER — Observation Stay (HOSPITAL_COMMUNITY): Payer: Medicare HMO

## 2017-03-11 DIAGNOSIS — H409 Unspecified glaucoma: Secondary | ICD-10-CM | POA: Diagnosis present

## 2017-03-11 DIAGNOSIS — Z90710 Acquired absence of both cervix and uterus: Secondary | ICD-10-CM | POA: Diagnosis not present

## 2017-03-11 DIAGNOSIS — Z981 Arthrodesis status: Secondary | ICD-10-CM | POA: Diagnosis not present

## 2017-03-11 DIAGNOSIS — R339 Retention of urine, unspecified: Secondary | ICD-10-CM | POA: Diagnosis present

## 2017-03-11 DIAGNOSIS — E032 Hypothyroidism due to medicaments and other exogenous substances: Secondary | ICD-10-CM

## 2017-03-11 DIAGNOSIS — M542 Cervicalgia: Secondary | ICD-10-CM | POA: Diagnosis present

## 2017-03-11 DIAGNOSIS — E860 Dehydration: Secondary | ICD-10-CM | POA: Diagnosis present

## 2017-03-11 DIAGNOSIS — R531 Weakness: Secondary | ICD-10-CM | POA: Diagnosis not present

## 2017-03-11 DIAGNOSIS — R51 Headache: Secondary | ICD-10-CM | POA: Diagnosis not present

## 2017-03-11 DIAGNOSIS — F1120 Opioid dependence, uncomplicated: Secondary | ICD-10-CM | POA: Diagnosis present

## 2017-03-11 DIAGNOSIS — M62838 Other muscle spasm: Secondary | ICD-10-CM | POA: Diagnosis present

## 2017-03-11 DIAGNOSIS — D72829 Elevated white blood cell count, unspecified: Secondary | ICD-10-CM | POA: Diagnosis not present

## 2017-03-11 DIAGNOSIS — T40605A Adverse effect of unspecified narcotics, initial encounter: Secondary | ICD-10-CM | POA: Diagnosis present

## 2017-03-11 DIAGNOSIS — F1129 Opioid dependence with unspecified opioid-induced disorder: Secondary | ICD-10-CM | POA: Diagnosis not present

## 2017-03-11 DIAGNOSIS — R2681 Unsteadiness on feet: Secondary | ICD-10-CM | POA: Diagnosis not present

## 2017-03-11 DIAGNOSIS — S161XXA Strain of muscle, fascia and tendon at neck level, initial encounter: Secondary | ICD-10-CM | POA: Diagnosis present

## 2017-03-11 DIAGNOSIS — G8929 Other chronic pain: Secondary | ICD-10-CM | POA: Diagnosis present

## 2017-03-11 DIAGNOSIS — K5903 Drug induced constipation: Secondary | ICD-10-CM | POA: Diagnosis present

## 2017-03-11 DIAGNOSIS — X58XXXA Exposure to other specified factors, initial encounter: Secondary | ICD-10-CM | POA: Diagnosis present

## 2017-03-11 DIAGNOSIS — E039 Hypothyroidism, unspecified: Secondary | ICD-10-CM | POA: Diagnosis not present

## 2017-03-11 DIAGNOSIS — Z87891 Personal history of nicotine dependence: Secondary | ICD-10-CM | POA: Diagnosis not present

## 2017-03-11 LAB — CSF CELL COUNT WITH DIFFERENTIAL
RBC Count, CSF: 9 /mm3 — ABNORMAL HIGH
TUBE #: 1
WBC, CSF: 1 /mm3 (ref 0–5)

## 2017-03-11 LAB — COMPREHENSIVE METABOLIC PANEL
ALT: 23 U/L (ref 14–54)
ANION GAP: 7 (ref 5–15)
AST: 22 U/L (ref 15–41)
Albumin: 3.2 g/dL — ABNORMAL LOW (ref 3.5–5.0)
Alkaline Phosphatase: 78 U/L (ref 38–126)
BILIRUBIN TOTAL: 0.4 mg/dL (ref 0.3–1.2)
BUN: 8 mg/dL (ref 6–20)
CALCIUM: 8.7 mg/dL — AB (ref 8.9–10.3)
CO2: 26 mmol/L (ref 22–32)
CREATININE: 0.45 mg/dL (ref 0.44–1.00)
Chloride: 104 mmol/L (ref 101–111)
GFR calc non Af Amer: >60 mL/min (ref >60)
GLUCOSE: 108 mg/dL — AB (ref 65–99)
Potassium: 3.5 mmol/L (ref 3.5–5.1)
Sodium: 137 mmol/L (ref 135–145)
TOTAL PROTEIN: 6.2 g/dL — AB (ref 6.5–8.1)

## 2017-03-11 LAB — CBC WITH DIFFERENTIAL/PLATELET
Basophils Absolute: 0 10*3/uL (ref 0.0–0.1)
Basophils Relative: 0 %
Eosinophils Absolute: 0.3 10*3/uL (ref 0.0–0.7)
Eosinophils Relative: 2 %
HCT: 35.8 % — ABNORMAL LOW (ref 36.0–46.0)
Hemoglobin: 12.1 g/dL (ref 12.0–15.0)
Lymphocytes Relative: 19 %
Lymphs Abs: 2 10*3/uL (ref 0.7–4.0)
MCH: 29.6 pg (ref 26.0–34.0)
MCHC: 33.8 g/dL (ref 30.0–36.0)
MCV: 87.5 fL (ref 78.0–100.0)
Monocytes Absolute: 1.8 10*3/uL — ABNORMAL HIGH (ref 0.1–1.0)
Monocytes Relative: 16 %
Neutro Abs: 6.9 10*3/uL (ref 1.7–7.7)
Neutrophils Relative %: 63 %
Platelets: 369 10*3/uL (ref 150–400)
RBC: 4.09 MIL/uL (ref 3.87–5.11)
RDW: 12.9 % (ref 11.5–15.5)
WBC: 11 10*3/uL — ABNORMAL HIGH (ref 4.0–10.5)

## 2017-03-11 LAB — HIV ANTIBODY (ROUTINE TESTING W REFLEX): HIV Screen 4th Generation wRfx: NONREACTIVE

## 2017-03-11 LAB — C-REACTIVE PROTEIN: CRP: 11.2 mg/dL — AB (ref <1.0)

## 2017-03-11 LAB — TROPONIN I: Troponin I: <0.03 ng/mL (ref <0.03)

## 2017-03-11 LAB — SEDIMENTATION RATE: SED RATE: 42 mm/h — AB (ref 0–22)

## 2017-03-11 LAB — PROTEIN, CSF: TOTAL PROTEIN, CSF: 32 mg/dL (ref 15–45)

## 2017-03-11 LAB — URINE CULTURE

## 2017-03-11 LAB — GLUCOSE, CSF: GLUCOSE CSF: 75 mg/dL — AB (ref 40–70)

## 2017-03-11 MED ORDER — CEFTAZIDIME 2 G IJ SOLR
2.0000 g | Freq: Three times a day (TID) | INTRAMUSCULAR | Status: DC
  Administered 2017-03-11: 2 g via INTRAVENOUS
  Filled 2017-03-11 (×3): qty 2

## 2017-03-11 MED ORDER — LIDOCAINE HCL 1 % IJ SOLN
INTRAMUSCULAR | Status: AC
  Filled 2017-03-11: qty 10

## 2017-03-11 MED ORDER — OXYCODONE HCL 5 MG PO TABS
5.0000 mg | ORAL_TABLET | ORAL | Status: DC | PRN
  Administered 2017-03-12 – 2017-03-13 (×5): 10 mg via ORAL
  Filled 2017-03-11 (×5): qty 2

## 2017-03-11 MED ORDER — LIDOCAINE HCL (PF) 1 % IJ SOLN
5.0000 mL | Freq: Once | INTRAMUSCULAR | Status: AC
  Administered 2017-03-11: 5 mL via INTRADERMAL

## 2017-03-11 MED ORDER — VANCOMYCIN HCL IN DEXTROSE 750-5 MG/150ML-% IV SOLN
750.0000 mg | Freq: Two times a day (BID) | INTRAVENOUS | Status: DC
  Administered 2017-03-11: 750 mg via INTRAVENOUS
  Filled 2017-03-11 (×2): qty 150

## 2017-03-11 NOTE — Evaluation (Signed)
Physical Therapy Evaluation Patient Details Name: Ann Bell MRN: [id number] DOB: [birth year] Today's Date: 03/11/2017   History of Present Illness  Pt is a 71 y.o.femalepresented to EDwith neck pain and headache about 7days after having lumbar fusion surgery. The neck pain radiates to the posterior head and also reports some difficulty swallowing. She reports photophobia and barely able to open eyes due to light sensitivity. Pt being tested for meningitis  Clinical Impression  Orders received for PT evaluation. Patient demonstrates deficits in functional mobility as indicated below. Will benefit from continued skilled PT to address deficits and maximize function. Will see as indicated and progress as tolerated.      Follow Up Recommendations No PT follow up    Equipment Recommendations  None recommended by PT    Recommendations for Other Services       Precautions / Restrictions Precautions Precautions: Back Precaution Booklet Issued: Yes (comment) Precaution Comments: Reviewed handout provided by PT Required Braces or Orthoses: Spinal Brace Spinal Brace: Applied in sitting position Restrictions Weight Bearing Restrictions: No      Mobility  Bed Mobility Overal bed mobility: Needs Assistance Bed Mobility: Rolling;Sidelying to Sit;Sit to Sidelying Rolling: Supervision Sidelying to sit: Supervision     Sit to sidelying: Supervision General bed mobility comments: supervision for cues for safety and technique  Transfers Overall transfer level: Needs assistance Equipment used: None Transfers: Sit to/from Stand Sit to Stand: Min guard         General transfer comment: min guard for stability when coming to standing  Ambulation/Gait Ambulation/Gait assistance: Min assist Ambulation Distance (Feet): 90 Feet Assistive device: 1 person hand held assist Gait Pattern/deviations: Step-through pattern;Decreased stride length Gait velocity: decreased Gait velocity  interpretation: Below normal speed for age/gender General Gait Details: modestly decreased, some instability noted with need for physical assist, increased lateral sway. limited by pain  Stairs            Wheelchair Mobility    Modified Rankin (Stroke Patients Only)       Balance Overall balance assessment: No apparent balance deficits (not formally assessed)                                           Pertinent Vitals/Pain Pain Assessment: 0-10 Pain Score: 7  Pain Location: neck and back Pain Descriptors / Indicators: Aching;Discomfort;Radiating Pain Intervention(s): Monitored during session    Home Living Family/patient expects to be discharged to:: Private residence Living Arrangements: Spouse/significant other Available Help at Discharge: Family Type of Home: House Home Access: Stairs to enter Entrance Stairs-Rails: Can reach both Entrance Stairs-Number of Steps: 4 Home Layout: Two level Home Equipment: None      Prior Function Level of Independence: Independent         Comments: was home and mobilizing well after prior back surgery     Hand Dominance   Dominant Hand: Right    Extremity/Trunk Assessment   Upper Extremity Assessment Upper Extremity Assessment: Defer to OT evaluation    Lower Extremity Assessment Lower Extremity Assessment: RLE deficits/detail RLE Deficits / Details: some modest RLE weakness in comparison to LLE 4-/5 gross motions    Cervical / Trunk Assessment Cervical / Trunk Assessment: Other exceptions Cervical / Trunk Exceptions: s/p spinal surgery  Communication   Communication: No difficulties  Cognition Arousal/Alertness: Awake/alert Behavior During Therapy: WFL for tasks assessed/performed Overall Cognitive Status: No family/caregiver  present to determine baseline cognitive functioning                                        General Comments      Exercises     Assessment/Plan     PT Assessment Patient needs continued PT services  PT Problem List Decreased strength;Decreased activity tolerance;Decreased balance;Decreased mobility;Pain       PT Treatment Interventions DME instruction;Gait training;Stair training;Functional mobility training;Therapeutic activities;Therapeutic exercise;Balance training;Patient/family education    PT Goals (Current goals can be found in the Care Plan section)  Acute Rehab PT Goals Patient Stated Goal: to go home PT Goal Formulation: With patient Time For Goal Achievement: 03/25/17 Potential to Achieve Goals: Good    Frequency Min 3X/week   Barriers to discharge        Co-evaluation               AM-PAC PT "6 Clicks" Daily Activity  Outcome Measure Difficulty turning over in bed (including adjusting bedclothes, sheets and blankets)?: A Little Difficulty moving from lying on back to sitting on the side of the bed? : A Little Difficulty sitting down on and standing up from a chair with arms (e.g., wheelchair, bedside commode, etc,.)?: A Little Help needed moving to and from a bed to chair (including a wheelchair)?: A Little Help needed walking in hospital room?: A Little Help needed climbing 3-5 steps with a railing? : A Little 6 Click Score: 18    End of Session Equipment Utilized During Treatment: Back brace Activity Tolerance: Patient tolerated treatment well Patient left: in bed;with call bell/phone within reach;with bed alarm set Nurse Communication: Mobility status PT Visit Diagnosis: Unsteadiness on feet (R26.81)    Time: 6578-4696 PT Time Calculation (min) (ACUTE ONLY): 23 min   Charges:   PT Evaluation $PT Eval Moderate Complexity: 1 Procedure     PT G Codes:        Charlotte Crumb, PT DPT  [phone]   Fabio Asa 03/11/2017, 3:01 PM

## 2017-03-11 NOTE — Care Management Obs Status (Signed)
MEDICARE OBSERVATION STATUS NOTIFICATION   Patient Details  Name: Ann Bell MRN: [id number] Date of Birth: 1945/12/11   Medicare Observation Status Notification Given:  Yes    Lawerance Sabal, RN 03/11/2017, 9:52 AM

## 2017-03-11 NOTE — Procedures (Signed)
Fluoroscopic guided lumbar puncture performed at L2-L3.  Opening pressure 21 mmH20.  14 mL clear CSF obtained and sent to lab per orders of primary medical team.  See full dictation in PACS for further details.

## 2017-03-11 NOTE — Progress Notes (Signed)
Pharmacy Antibiotic Note  Ann Bell is a 71 y.o. female admitted on 03/10/2017 with post-neurosurgical meningitis.  Pharmacy has been consulted for ceftazidime and vancomycin dosing.  Plan: Vancomycin 750mg  IV every 12 hours.  Goal trough 15-20 mcg/mL.  Ceftazidime 2g IV q8h Monitor culture data, renal function and clinical course VT at SS prn  Height: 5\' 3"  (160 cm) Weight: 133 lb 9.6 oz (60.6 kg) IBW/kg (Calculated) : 52.4  Temp (24hrs), Avg:97.9 F (36.6 C), Min:97 F (36.1 C), Max:98.6 F (37 C)   Recent Labs Lab 03/10/17 0940 03/10/17 0949 03/10/17 2003 03/11/17 0210  WBC 12.6*  --  13.0* 11.0*  CREATININE 0.59  --  0.52 0.45  LATICACIDVEN  --  0.89  --   --     Estimated Creatinine Clearance: 51.8 mL/min (by C-G formula based on SCr of 0.45 mg/dL).    Allergies  Allergen Reactions  . Codeine Nausea And Vomiting     Arlean Hopping. Newman Pies, PharmD, BCPS Clinical Pharmacist [phone] 03/11/2017 1:13 PM

## 2017-03-11 NOTE — Progress Notes (Addendum)
[facility] PROGRESS NOTE  Ann Bell ZOX:[id number] DOB: 1946/05/14 DOA: 03/10/2017   PCP: Eliott Nine, MD     Assessment/Plan: Active Problems:   Intractable headache   Severe Acute on Chronic Neck pain   Opioid dependence (HCC)   Gait instability   Generalized weakness   Leukocytosis   Hypothyroidism   71 y.o. female presented to ED with neck pain and headache about 7 days after having lumbar fusion surgery on 5/4.  The neck pain radiates to the posterior head and also reports some difficulty swallowing.She had lumbar fusion of L4-L5 by Dr. Bevely Palmer on May 4. Her postop course was unremarkable and she was discharged on 03/02/17. 3 days ago she started to develop gradually worsening neck pain radiating to the posterior head. Now admitted for rule out meningitis due to neck pain of occipital headache   Assessment and plan 1.  Intractable head and neck pain - recently postop lumbar fusion surgery- I requested consultation from neurology and neurosurgery.  She underwent lumbar puncture to rule out meningitis.Due to recent surgery on her lumbar spine, neurology hesitant to perform bedside LP. Interventional radiology notified.  CT head neck negative for acute findings.  MRI head unremarkable, MRI cervical spine shows new relative for medial stenosis. Currently on OxyContin, IV Dilaudid,   will start oxycodone for breakthrough pain .  Further recommendations pending results of lumbar puncture which on initial review appear negative , neurology and neurosurgery to make final recommendations May benefit from trial of steroids if no improvement with current pain regimen, once infection is r/o .Started on empiric abx by Andres Labrum ,neurology . Could also use iv depakote for HA    2. Headache - could this be an atypical migraine? Further evaluation pending LP reviw by neuro   3. Difficulty ambulation - Get PT evaluation.  Fall precautions.  Assistance with ambulation.   4. Difficulty swallowing - get swallow evaluation.   5. Generalized weakness - PT eval -no PT follow up needed  6. Mild dehydration - gentle IVF hydration ordered.  7. Leukocytosis - no fever.  no clear source of infection,CXR for completeness  8. Hypothyroidism - resume home thyroid replacement.  9.         Abnormal Urinalysis - WBC seen in UA, no bacteria.  Pt denies dysuria.   urine  culture nonspecific    DVT prophylaxsis SCDs  Code Status:  Full code    Family Communication: Discussed in detail with the patient, all imaging results, lab results explained to the patient   Disposition Plan:  Neurology consultation     Consultants:  Neurology  Neurosurgery  Procedures:  None  Antibiotics: Anti-infectives    None         HPI/Subjective: Still has severe intractable, occipital HA, no nausea , no confusion   Objective: Vitals:   03/10/17 1515 03/10/17 1928 03/11/17 0111 03/11/17 0546  BP:  137/69 (!) 117/59 (!) 123/58  Pulse: 82 76 82 82  Resp: 14 18 18 18   Temp:  98.6 F (37 C) 98.6 F (37 C) 97.5 F (36.4 C)  TempSrc:  Oral Oral Oral  SpO2: 100% 100% 95% 96%  Weight:  60.6 kg (133 lb 9.6 oz)    Height:  5\' 3"  (1.6 m)      Intake/Output Summary (Last 24 hours) at 03/11/17 0838 Last data filed at 03/10/17 1540  Gross per 24 hour  Intake  1000 ml  Output                0 ml  Net             1000 ml    Exam:  Examination:  General exam: Appears calm and comfortable  Respiratory system: Clear to auscultation. Respiratory effort normal. Cardiovascular system: S1 & S2 heard, RRR. No JVD, murmurs, rubs, gallops or clicks. No pedal edema. Gastrointestinal system: Abdomen is nondistended, soft and nontender. No organomegaly or masses felt. Normal bowel sounds heard. Central nervous system: Alert and oriented. No focal neurological deficits. Extremities: Symmetric 5 x 5 power. Skin: No rashes, lesions or ulcers Psychiatry: Judgement and  insight appear normal. Mood & affect appropriate.     Data Reviewed: I have personally reviewed following labs and imaging studies  Micro Results Recent Results (from the past 240 hour(s))  Culture, blood (routine x 2)     Status: None (Preliminary result)   Collection Time: 03/10/17  8:09 PM  Result Value Ref Range Status   Specimen Description BLOOD RIGHT ANTECUBITAL  Final   Special Requests   Final    BOTTLES DRAWN AEROBIC ONLY Blood Culture results may not be optimal due to an inadequate volume of blood received in culture bottles   Culture PENDING  Incomplete   Report Status PENDING  Incomplete  Culture, blood (routine x 2)     Status: None (Preliminary result)   Collection Time: 03/10/17  8:09 PM  Result Value Ref Range Status   Specimen Description BLOOD RIGHT HAND  Final   Special Requests   Final    BOTTLES DRAWN AEROBIC ONLY Blood Culture results may not be optimal due to an inadequate volume of blood received in culture bottles   Culture PENDING  Incomplete   Report Status PENDING  Incomplete    Radiology Reports Dg Lumbar Spine 2-3 Views  Result Date: 02/28/2017 CLINICAL DATA:  L4-5 laminectomy and fusion. Intraoperative imaging. EXAM: DG C-ARM 61-120 MIN; LUMBAR SPINE - 2-3 VIEW COMPARISON:  CT lumbar spine 02/26/2017. FINDINGS: Two intraoperative fluoroscopic spot views of the lumbar spine demonstrate pedicle screws, stabilization bars interbody spacer in place at L4-5. 0.5 cm anterolisthesis L4 on L5 seen on the comparison examination has been nearly completely reduced. No acute abnormality. IMPRESSION: L4-5 laminectomy and fusion.  No acute abnormality. Electronically Signed   By: Drusilla Kanner M.D.   On: 02/28/2017 12:11   Ct Head Wo Contrast  Result Date: 03/10/2017 CLINICAL DATA:  71 year old female with 3 days of neck pain. Difficulty swallowing. Recent lumbar surgery. EXAM: CT HEAD WITHOUT CONTRAST CT ANGIOGRAPHY NECK TECHNIQUE: Multidetector CT imaging of the  head without contrast and CTA neck was performed using bolus administration of intravenous contrast for the neck portion. Multiplanar CT image reconstructions and MIPs were obtained to evaluate the vascular anatomy. Carotid stenosis measurements (when applicable) are obtained utilizing NASCET criteria, using the distal internal carotid diameter as the denominator. CONTRAST:  50 mL Isovue 370 COMPARISON:  Cervical spine MRI 12/11/2010 FINDINGS: CT HEAD Brain: No midline shift, ventriculomegaly, mass effect, evidence of mass lesion, intracranial hemorrhage or evidence of cortically based acute infarction. Gray-white matter differentiation is within normal limits throughout the brain. Calvarium and skull base: Negative. No acute osseous abnormality identified. Paranasal sinuses: Clear. Orbits: No acute orbit or scalp soft tissue findings. CTA NECK Skeleton: Visualized skull base is intact. No atlanto-occipital dissociation. Mild degenerative appearing anterolisthesis of C3 on C4 is chronic. Straightening of cervical lordosis.  Bilateral posterior element alignment is within normal limits. Chronic disc and endplate degeneration at C5-C6 with mild spinal stenosis. No acute osseous abnormality identified. Upper chest: Negative.  No superior mediastinal lymphadenopathy. Other neck: Thyroid, larynx, pharynx, parapharyngeal spaces, sublingual space, right submandibular gland and parotid glands are within normal limits. The left submandibular gland appears atrophied or surgically absent. Partially retropharyngeal course of the right carotid, otherwise negative retropharyngeal space. No cervical lymphadenopathy. Aortic arch: Negative.  Three vessel arch configuration. Right carotid system: Negative aside from a partially retropharyngeal course. Visible right ICA siphon is patent with minor calcified atherosclerosis. Visible right anterior circulation appears normal. A fetal type right PCA origin is suspected. Left carotid  system: Negative aside from mild tortuosity. Visible left ICA siphon is patent with mild calcified plaque. Visible left anterior circulation is within normal limits; non dominant left A1 segment and fetal type left PCA origins are suspected. Vertebral arteries: No proximal subclavian artery atherosclerosis or stenosis. Tortuous but otherwise negative right vertebral artery. Negative left vertebral artery. The vertebral arteries are fairly codominant. PICA origins are patent. The distal vertebral and basilar arteries appear diminutive, which appears to beyond the basis of fetal type PCA origins. SCA origins are patent. Review of the MIP images confirms the above findings IMPRESSION: 1.  Normal for age non contrast CT appearance of the brain. 2. Negative CTA neck aside from carotid and vertebral artery tortuosity. 3. No acute osseous abnormality identified. Cervical spine degeneration appears not significantly changed since 2012. 4. 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 Electronically Signed   By: Odessa Fleming M.D.   On: 03/10/2017 15:16   Ct Angio Neck W And/or Wo Contrast  Result Date: 03/10/2017 CLINICAL DATA:  71 year old female with 3 days of neck pain. Difficulty swallowing. Recent lumbar surgery. EXAM: CT HEAD WITHOUT CONTRAST CT ANGIOGRAPHY NECK TECHNIQUE: Multidetector CT imaging of the head without contrast and CTA neck was performed using bolus administration of intravenous contrast for the neck portion. Multiplanar CT image reconstructions and MIPs were obtained to evaluate the vascular anatomy. Carotid stenosis measurements (when applicable) are obtained utilizing NASCET criteria, using the distal internal carotid diameter as the denominator. CONTRAST:  50 mL Isovue 370 COMPARISON:  Cervical spine MRI 12/11/2010 FINDINGS: CT HEAD Brain: No midline shift, ventriculomegaly, mass effect, evidence of mass lesion, intracranial hemorrhage or  evidence of cortically based acute infarction. Gray-white matter differentiation is within normal limits throughout the brain. Calvarium and skull base: Negative. No acute osseous abnormality identified. Paranasal sinuses: Clear. Orbits: No acute orbit or scalp soft tissue findings. CTA NECK Skeleton: Visualized skull base is intact. No atlanto-occipital dissociation. Mild degenerative appearing anterolisthesis of C3 on C4 is chronic. Straightening of cervical lordosis. Bilateral posterior element alignment is within normal limits. Chronic disc and endplate degeneration at C5-C6 with mild spinal stenosis. No acute osseous abnormality identified. Upper chest: Negative.  No superior mediastinal lymphadenopathy. Other neck: Thyroid, larynx, pharynx, parapharyngeal spaces, sublingual space, right submandibular gland and parotid glands are within normal limits. The left submandibular gland appears atrophied or surgically absent. Partially retropharyngeal course of the right carotid, otherwise negative retropharyngeal space. No cervical lymphadenopathy. Aortic arch: Negative.  Three vessel arch configuration. Right carotid system: Negative aside from a partially retropharyngeal course. Visible right ICA siphon is patent with minor calcified atherosclerosis. Visible right anterior circulation appears normal. A fetal type right PCA origin is suspected. Left carotid system: Negative aside from mild tortuosity. Visible left ICA siphon  is patent with mild calcified plaque. Visible left anterior circulation is within normal limits; non dominant left A1 segment and fetal type left PCA origins are suspected. Vertebral arteries: No proximal subclavian artery atherosclerosis or stenosis. Tortuous but otherwise negative right vertebral artery. Negative left vertebral artery. The vertebral arteries are fairly codominant. PICA origins are patent. The distal vertebral and basilar arteries appear diminutive, which appears to beyond the  basis of fetal type PCA origins. SCA origins are patent. Review of the MIP images confirms the above findings IMPRESSION: 1.  Normal for age non contrast CT appearance of the brain. 2. Negative CTA neck aside from carotid and vertebral artery tortuosity. 3. No acute osseous abnormality identified. Cervical spine degeneration appears not significantly changed since 2012. 4. 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 Electronically Signed   By: Odessa Fleming M.D.   On: 03/10/2017 15:16   Ct Lumbar Spine Wo Contrast  Result Date: 02/26/2017 CLINICAL DATA:  Surgical planning, lumbosacral spondylosis and radiculopathy. EXAM: CT LUMBAR SPINE WITHOUT CONTRAST TECHNIQUE: Multidetector CT imaging of the lumbar spine was performed without intravenous contrast administration. Multiplanar CT image reconstructions were also generated. Mazor protocol. COMPARISON:  MRI of lumbar spine January 11, 2017 FINDINGS: SEGMENTATION: For the purposes of this report the last well-formed intervertebral disc space is reported as L5-S1. ALIGNMENT: Maintained lumbar lordosis. Grade 1 (5 mm) L4-5 anterolisthesis without spondylolysis. VERTEBRAE: Vertebral bodies and posterior elements are intact. Severe C5-6 disc height loss with vacuum disc, endplate sclerosis and marginal spurring compatible with degenerative disc. Mild L4-5 disc height loss. No destructive bony lesions. PARASPINAL AND OTHER SOFT TISSUES: Included prevertebral and paraspinal soft tissues are nonacute. Mild calcific atherosclerosis of the of the aortoiliac vessels. DISC LEVELS: T12-L1, L1-2, L2-3: No disc bulge, canal stenosis nor neural foraminal narrowing. L3-4: Annular bulging. Mild LEFT facet arthropathy without canal stenosis or neural foraminal narrowing. L4-5: Anterolisthesis. Moderate to severe facet arthropathy and ligamentum flavum redundancy. Moderate to severe canal stenosis and lateral recess effacement  which likely affects the traversing L5 nerves. Moderate to severe RIGHT, mild LEFT neural foraminal narrowing. L5-S1: Small broad-based disc osteophyte complex. Mild facet arthropathy and ligamentum flavum redundancy without canal stenosis. Moderate to severe bilateral neural foraminal narrowing. IMPRESSION: Grade 1 L4-5 anterolisthesis on degenerative basis. Moderate to severe canal stenosis L4-5. Moderate to severe L4-5 and L5-S1 neural foraminal narrowing. Electronically Signed   By: Awilda Metro M.D.   On: 02/26/2017 16:44   Mr Laqueta Jean ZO Contrast  Result Date: 03/10/2017 CLINICAL DATA:  Neck pain and headache for 1 week after lumbar spine surgery. Difficulty swallowing, photophobia. Evaluate intractable headache. EXAM: MRI HEAD WITHOUT AND WITH CONTRAST MRI CERVICAL SPINE WITHOUT CONTRAST TECHNIQUE: Multiplanar, multiecho pulse sequences of the brain and surrounding structures were obtained without and with intravenous contrast. Multiplanar multi echo pulse sequences of the cervical spine, to include the craniocervical junction and cervicothoracic junction, were obtained without intravenous contrast. CONTRAST:  10mL MULTIHANCE GADOBENATE DIMEGLUMINE 529 MG/ML IV SOLN COMPARISON:  CT HEAD and CTA HEAD and neck Mar 10, 2017 FINDINGS: MRI HEAD FINDINGS INTRACRANIAL CONTENTS: No reduced diffusion to suggest acute ischemia. No susceptibility artifact to suggest hemorrhage. The ventricles and sulci are normal for patient's age. No suspicious parenchymal signal, masses, mass effect. No abnormal intraparenchymal or extra-axial enhancement. Perivascular space RIGHT thalamus. No abnormal extra-axial fluid collections. Mildly prominent extra-axial spaces at the convexity, likely normal variant without corroborative findings of intracranial hypotension such as  sagging midbrain or dural enhancement. No extra-axial masses. VASCULAR: Normal major intracranial vascular flow voids present at skull base. SKULL AND UPPER  CERVICAL SPINE: No abnormal sellar expansion. No suspicious calvarial bone marrow signal. Craniocervical junction maintained. SINUSES/ORBITS: The mastoid air-cells and included paranasal sinuses are well-aerated.The included ocular globes and orbital contents are non-suspicious. Status post bilateral ocular lens implants. OTHER: None. MRI CERVICAL SPINE FINDINGS ALIGNMENT: Maintained cervical lordosis.  No malalignment. VERTEBRAE/DISCS: Vertebral bodies are intact. Moderate to severe C5-6 disc height loss, moderate C4-5 and mild at C6-7 with proportional chronic discogenic endplate changes. Disc desiccation all cervical levels. Mild periarticular bright STIR signal LEFT C3-4 facets. CORD:Cervical spinal cord is normal morphology and signal characteristics from the cervicomedullary junction to level of T3-4, the most caudal well visualized level. POSTERIOR FOSSA, VERTEBRAL ARTERIES, PARASPINAL TISSUES: No MR findings of ligamentous injury. Vertebral artery flow voids present. Included posterior fossa and paraspinal soft tissues are normal. DISC LEVELS: C2-3: No disc bulge, canal stenosis nor neural foraminal narrowing. C3-4: Annular bulging, uncovertebral hypertrophy and tiny central disc protrusion. Mild facet arthropathy. No canal stenosis or neural foraminal narrowing. C4-5: Small broad-based disc bulge, uncovertebral hypertrophy and mild facet arthropathy. No canal stenosis. Mild RIGHT neural foraminal narrowing. C5-6: Small broad-based disc bulge, uncovertebral hypertrophy and mild facet arthropathy. Mild canal stenosis. Mild RIGHT, moderate LEFT neural foraminal narrowing. C6-7: Small broad-based disc bulge. Uncovertebral hypertrophy. No canal stenosis. Mild LEFT neural foraminal narrowing. C7-T1: No disc bulge, canal stenosis nor neural foraminal narrowing. IMPRESSION: MRI head: Negative MRI of the head for age with and without contrast. MRI cervical spine: Degenerative cervical spine resulting in mild canal  stenosis at C5-6. Neural foraminal narrowing C4-5 through C6-7: Moderate on the LEFT at C5-6. LEFT C3-4 facet arthropathy and mild acute reactive changes. Electronically Signed   By: Awilda Metro M.D.   On: 03/10/2017 18:53   Mr Cervical Spine Wo Contrast  Result Date: 03/10/2017 CLINICAL DATA:  Neck pain and headache for 1 week after lumbar spine surgery. Difficulty swallowing, photophobia. Evaluate intractable headache. EXAM: MRI HEAD WITHOUT AND WITH CONTRAST MRI CERVICAL SPINE WITHOUT CONTRAST TECHNIQUE: Multiplanar, multiecho pulse sequences of the brain and surrounding structures were obtained without and with intravenous contrast. Multiplanar multi echo pulse sequences of the cervical spine, to include the craniocervical junction and cervicothoracic junction, were obtained without intravenous contrast. CONTRAST:  10mL MULTIHANCE GADOBENATE DIMEGLUMINE 529 MG/ML IV SOLN COMPARISON:  CT HEAD and CTA HEAD and neck Mar 10, 2017 FINDINGS: MRI HEAD FINDINGS INTRACRANIAL CONTENTS: No reduced diffusion to suggest acute ischemia. No susceptibility artifact to suggest hemorrhage. The ventricles and sulci are normal for patient's age. No suspicious parenchymal signal, masses, mass effect. No abnormal intraparenchymal or extra-axial enhancement. Perivascular space RIGHT thalamus. No abnormal extra-axial fluid collections. Mildly prominent extra-axial spaces at the convexity, likely normal variant without corroborative findings of intracranial hypotension such as sagging midbrain or dural enhancement. No extra-axial masses. VASCULAR: Normal major intracranial vascular flow voids present at skull base. SKULL AND UPPER CERVICAL SPINE: No abnormal sellar expansion. No suspicious calvarial bone marrow signal. Craniocervical junction maintained. SINUSES/ORBITS: The mastoid air-cells and included paranasal sinuses are well-aerated.The included ocular globes and orbital contents are non-suspicious. Status post bilateral  ocular lens implants. OTHER: None. MRI CERVICAL SPINE FINDINGS ALIGNMENT: Maintained cervical lordosis.  No malalignment. VERTEBRAE/DISCS: Vertebral bodies are intact. Moderate to severe C5-6 disc height loss, moderate C4-5 and mild at C6-7 with proportional chronic discogenic endplate changes. Disc desiccation all cervical levels. Mild periarticular  bright STIR signal LEFT C3-4 facets. CORD:Cervical spinal cord is normal morphology and signal characteristics from the cervicomedullary junction to level of T3-4, the most caudal well visualized level. POSTERIOR FOSSA, VERTEBRAL ARTERIES, PARASPINAL TISSUES: No MR findings of ligamentous injury. Vertebral artery flow voids present. Included posterior fossa and paraspinal soft tissues are normal. DISC LEVELS: C2-3: No disc bulge, canal stenosis nor neural foraminal narrowing. C3-4: Annular bulging, uncovertebral hypertrophy and tiny central disc protrusion. Mild facet arthropathy. No canal stenosis or neural foraminal narrowing. C4-5: Small broad-based disc bulge, uncovertebral hypertrophy and mild facet arthropathy. No canal stenosis. Mild RIGHT neural foraminal narrowing. C5-6: Small broad-based disc bulge, uncovertebral hypertrophy and mild facet arthropathy. Mild canal stenosis. Mild RIGHT, moderate LEFT neural foraminal narrowing. C6-7: Small broad-based disc bulge. Uncovertebral hypertrophy. No canal stenosis. Mild LEFT neural foraminal narrowing. C7-T1: No disc bulge, canal stenosis nor neural foraminal narrowing. IMPRESSION: MRI head: Negative MRI of the head for age with and without contrast. MRI cervical spine: Degenerative cervical spine resulting in mild canal stenosis at C5-6. Neural foraminal narrowing C4-5 through C6-7: Moderate on the LEFT at C5-6. LEFT C3-4 facet arthropathy and mild acute reactive changes. Electronically Signed   By: Awilda Metro M.D.   On: 03/10/2017 18:53   Dg C-arm 61-120 Min  Result Date: 02/28/2017 CLINICAL DATA:  L4-5  laminectomy and fusion. Intraoperative imaging. EXAM: DG C-ARM 61-120 MIN; LUMBAR SPINE - 2-3 VIEW COMPARISON:  CT lumbar spine 02/26/2017. FINDINGS: Two intraoperative fluoroscopic spot views of the lumbar spine demonstrate pedicle screws, stabilization bars interbody spacer in place at L4-5. 0.5 cm anterolisthesis L4 on L5 seen on the comparison examination has been nearly completely reduced. No acute abnormality. IMPRESSION: L4-5 laminectomy and fusion.  No acute abnormality. Electronically Signed   By: Drusilla Kanner M.D.   On: 02/28/2017 12:11     CBC  Recent Labs Lab 03/10/17 0940 03/10/17 2003 03/11/17 0210  WBC 12.6* 13.0* 11.0*  HGB 13.8 13.3 12.1  HCT 41.2 39.2 35.8*  PLT 357 PLATELET CLUMPS NOTED ON SMEAR, UNABLE TO ESTIMATE 369  MCV 88.6 87.5 87.5  MCH 29.7 29.7 29.6  MCHC 33.5 33.9 33.8  RDW 13.3 13.2 12.9  LYMPHSABS  --  2.1 2.0  MONOABS  --  1.4* 1.8*  EOSABS  --  0.2 0.3  BASOSABS  --  0.0 0.0    Chemistries   Recent Labs Lab 03/10/17 0940 03/10/17 2003 03/11/17 0210  NA 136 136 137  K 3.6 4.0 3.5  CL 101 102 104  CO2 26 26 26   GLUCOSE 127* 109* 108*  BUN 8 7 8   CREATININE 0.59 0.52 0.45  CALCIUM 9.2 9.1 8.7*  AST  --  28 22  ALT  --  25 23  ALKPHOS  --  88 78  BILITOT  --  0.9 0.4   ------------------------------------------------------------------------------------------------------------------ estimated creatinine clearance is 51.8 mL/min (by C-G formula based on SCr of 0.45 mg/dL). ------------------------------------------------------------------------------------------------------------------ No results for input(s): HGBA1C in the last 72 hours. ------------------------------------------------------------------------------------------------------------------ No results for input(s): CHOL, HDL, LDLCALC, TRIG, CHOLHDL, LDLDIRECT in the last 72  hours. ------------------------------------------------------------------------------------------------------------------  Recent Labs  03/10/17 2003  TSH 0.481   ------------------------------------------------------------------------------------------------------------------ No results for input(s): VITAMINB12, FOLATE, FERRITIN, TIBC, IRON, RETICCTPCT in the last 72 hours.  Coagulation profile  Recent Labs Lab 03/10/17 2003  INR 1.05    No results for input(s): DDIMER in the last 72 hours.  Cardiac Enzymes  Recent Labs Lab 03/10/17 2003 03/11/17 0210  TROPONINI <0.03 <0.03   ------------------------------------------------------------------------------------------------------------------  Invalid input(s): POCBNP   CBG: No results for input(s): GLUCAP in the last 168 hours.     Studies: Ct Head Wo Contrast  Result Date: 03/10/2017 CLINICAL DATA:  71 year old female with 3 days of neck pain. Difficulty swallowing. Recent lumbar surgery. EXAM: CT HEAD WITHOUT CONTRAST CT ANGIOGRAPHY NECK TECHNIQUE: Multidetector CT imaging of the head without contrast and CTA neck was performed using bolus administration of intravenous contrast for the neck portion. Multiplanar CT image reconstructions and MIPs were obtained to evaluate the vascular anatomy. Carotid stenosis measurements (when applicable) are obtained utilizing NASCET criteria, using the distal internal carotid diameter as the denominator. CONTRAST:  50 mL Isovue 370 COMPARISON:  Cervical spine MRI 12/11/2010 FINDINGS: CT HEAD Brain: No midline shift, ventriculomegaly, mass effect, evidence of mass lesion, intracranial hemorrhage or evidence of cortically based acute infarction. Gray-white matter differentiation is within normal limits throughout the brain. Calvarium and skull base: Negative. No acute osseous abnormality identified. Paranasal sinuses: Clear. Orbits: No acute orbit or scalp soft tissue findings. CTA NECK  Skeleton: Visualized skull base is intact. No atlanto-occipital dissociation. Mild degenerative appearing anterolisthesis of C3 on C4 is chronic. Straightening of cervical lordosis. Bilateral posterior element alignment is within normal limits. Chronic disc and endplate degeneration at C5-C6 with mild spinal stenosis. No acute osseous abnormality identified. Upper chest: Negative.  No superior mediastinal lymphadenopathy. Other neck: Thyroid, larynx, pharynx, parapharyngeal spaces, sublingual space, right submandibular gland and parotid glands are within normal limits. The left submandibular gland appears atrophied or surgically absent. Partially retropharyngeal course of the right carotid, otherwise negative retropharyngeal space. No cervical lymphadenopathy. Aortic arch: Negative.  Three vessel arch configuration. Right carotid system: Negative aside from a partially retropharyngeal course. Visible right ICA siphon is patent with minor calcified atherosclerosis. Visible right anterior circulation appears normal. A fetal type right PCA origin is suspected. Left carotid system: Negative aside from mild tortuosity. Visible left ICA siphon is patent with mild calcified plaque. Visible left anterior circulation is within normal limits; non dominant left A1 segment and fetal type left PCA origins are suspected. Vertebral arteries: No proximal subclavian artery atherosclerosis or stenosis. Tortuous but otherwise negative right vertebral artery. Negative left vertebral artery. The vertebral arteries are fairly codominant. PICA origins are patent. The distal vertebral and basilar arteries appear diminutive, which appears to beyond the basis of fetal type PCA origins. SCA origins are patent. Review of the MIP images confirms the above findings IMPRESSION: 1.  Normal for age non contrast CT appearance of the brain. 2. Negative CTA neck aside from carotid and vertebral artery tortuosity. 3. No acute osseous abnormality  identified. Cervical spine degeneration appears not significantly changed since 2012. 4. 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 Electronically Signed   By: Odessa Fleming M.D.   On: 03/10/2017 15:16   Ct Angio Neck W And/or Wo Contrast  Result Date: 03/10/2017 CLINICAL DATA:  71 year old female with 3 days of neck pain. Difficulty swallowing. Recent lumbar surgery. EXAM: CT HEAD WITHOUT CONTRAST CT ANGIOGRAPHY NECK TECHNIQUE: Multidetector CT imaging of the head without contrast and CTA neck was performed using bolus administration of intravenous contrast for the neck portion. Multiplanar CT image reconstructions and MIPs were obtained to evaluate the vascular anatomy. Carotid stenosis measurements (when applicable) are obtained utilizing NASCET criteria, using the distal internal carotid diameter as the denominator. CONTRAST:  50 mL Isovue 370 COMPARISON:  Cervical spine MRI 12/11/2010 FINDINGS: CT HEAD Brain: No midline shift,  ventriculomegaly, mass effect, evidence of mass lesion, intracranial hemorrhage or evidence of cortically based acute infarction. Gray-white matter differentiation is within normal limits throughout the brain. Calvarium and skull base: Negative. No acute osseous abnormality identified. Paranasal sinuses: Clear. Orbits: No acute orbit or scalp soft tissue findings. CTA NECK Skeleton: Visualized skull base is intact. No atlanto-occipital dissociation. Mild degenerative appearing anterolisthesis of C3 on C4 is chronic. Straightening of cervical lordosis. Bilateral posterior element alignment is within normal limits. Chronic disc and endplate degeneration at C5-C6 with mild spinal stenosis. No acute osseous abnormality identified. Upper chest: Negative.  No superior mediastinal lymphadenopathy. Other neck: Thyroid, larynx, pharynx, parapharyngeal spaces, sublingual space, right submandibular gland and parotid glands are within  normal limits. The left submandibular gland appears atrophied or surgically absent. Partially retropharyngeal course of the right carotid, otherwise negative retropharyngeal space. No cervical lymphadenopathy. Aortic arch: Negative.  Three vessel arch configuration. Right carotid system: Negative aside from a partially retropharyngeal course. Visible right ICA siphon is patent with minor calcified atherosclerosis. Visible right anterior circulation appears normal. A fetal type right PCA origin is suspected. Left carotid system: Negative aside from mild tortuosity. Visible left ICA siphon is patent with mild calcified plaque. Visible left anterior circulation is within normal limits; non dominant left A1 segment and fetal type left PCA origins are suspected. Vertebral arteries: No proximal subclavian artery atherosclerosis or stenosis. Tortuous but otherwise negative right vertebral artery. Negative left vertebral artery. The vertebral arteries are fairly codominant. PICA origins are patent. The distal vertebral and basilar arteries appear diminutive, which appears to beyond the basis of fetal type PCA origins. SCA origins are patent. Review of the MIP images confirms the above findings IMPRESSION: 1.  Normal for age non contrast CT appearance of the brain. 2. Negative CTA neck aside from carotid and vertebral artery tortuosity. 3. No acute osseous abnormality identified. Cervical spine degeneration appears not significantly changed since 2012. 4. 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 Electronically Signed   By: Odessa Fleming M.D.   On: 03/10/2017 15:16   Mr Laqueta Jean ZO Contrast  Result Date: 03/10/2017 CLINICAL DATA:  Neck pain and headache for 1 week after lumbar spine surgery. Difficulty swallowing, photophobia. Evaluate intractable headache. EXAM: MRI HEAD WITHOUT AND WITH CONTRAST MRI CERVICAL SPINE WITHOUT CONTRAST TECHNIQUE: Multiplanar,  multiecho pulse sequences of the brain and surrounding structures were obtained without and with intravenous contrast. Multiplanar multi echo pulse sequences of the cervical spine, to include the craniocervical junction and cervicothoracic junction, were obtained without intravenous contrast. CONTRAST:  10mL MULTIHANCE GADOBENATE DIMEGLUMINE 529 MG/ML IV SOLN COMPARISON:  CT HEAD and CTA HEAD and neck Mar 10, 2017 FINDINGS: MRI HEAD FINDINGS INTRACRANIAL CONTENTS: No reduced diffusion to suggest acute ischemia. No susceptibility artifact to suggest hemorrhage. The ventricles and sulci are normal for patient's age. No suspicious parenchymal signal, masses, mass effect. No abnormal intraparenchymal or extra-axial enhancement. Perivascular space RIGHT thalamus. No abnormal extra-axial fluid collections. Mildly prominent extra-axial spaces at the convexity, likely normal variant without corroborative findings of intracranial hypotension such as sagging midbrain or dural enhancement. No extra-axial masses. VASCULAR: Normal major intracranial vascular flow voids present at skull base. SKULL AND UPPER CERVICAL SPINE: No abnormal sellar expansion. No suspicious calvarial bone marrow signal. Craniocervical junction maintained. SINUSES/ORBITS: The mastoid air-cells and included paranasal sinuses are well-aerated.The included ocular globes and orbital contents are non-suspicious. Status post bilateral ocular lens implants. OTHER: None. MRI CERVICAL SPINE FINDINGS  ALIGNMENT: Maintained cervical lordosis.  No malalignment. VERTEBRAE/DISCS: Vertebral bodies are intact. Moderate to severe C5-6 disc height loss, moderate C4-5 and mild at C6-7 with proportional chronic discogenic endplate changes. Disc desiccation all cervical levels. Mild periarticular bright STIR signal LEFT C3-4 facets. CORD:Cervical spinal cord is normal morphology and signal characteristics from the cervicomedullary junction to level of T3-4, the most caudal  well visualized level. POSTERIOR FOSSA, VERTEBRAL ARTERIES, PARASPINAL TISSUES: No MR findings of ligamentous injury. Vertebral artery flow voids present. Included posterior fossa and paraspinal soft tissues are normal. DISC LEVELS: C2-3: No disc bulge, canal stenosis nor neural foraminal narrowing. C3-4: Annular bulging, uncovertebral hypertrophy and tiny central disc protrusion. Mild facet arthropathy. No canal stenosis or neural foraminal narrowing. C4-5: Small broad-based disc bulge, uncovertebral hypertrophy and mild facet arthropathy. No canal stenosis. Mild RIGHT neural foraminal narrowing. C5-6: Small broad-based disc bulge, uncovertebral hypertrophy and mild facet arthropathy. Mild canal stenosis. Mild RIGHT, moderate LEFT neural foraminal narrowing. C6-7: Small broad-based disc bulge. Uncovertebral hypertrophy. No canal stenosis. Mild LEFT neural foraminal narrowing. C7-T1: No disc bulge, canal stenosis nor neural foraminal narrowing. IMPRESSION: MRI head: Negative MRI of the head for age with and without contrast. MRI cervical spine: Degenerative cervical spine resulting in mild canal stenosis at C5-6. Neural foraminal narrowing C4-5 through C6-7: Moderate on the LEFT at C5-6. LEFT C3-4 facet arthropathy and mild acute reactive changes. Electronically Signed   By: Awilda Metro M.D.   On: 03/10/2017 18:53   Mr Cervical Spine Wo Contrast  Result Date: 03/10/2017 CLINICAL DATA:  Neck pain and headache for 1 week after lumbar spine surgery. Difficulty swallowing, photophobia. Evaluate intractable headache. EXAM: MRI HEAD WITHOUT AND WITH CONTRAST MRI CERVICAL SPINE WITHOUT CONTRAST TECHNIQUE: Multiplanar, multiecho pulse sequences of the brain and surrounding structures were obtained without and with intravenous contrast. Multiplanar multi echo pulse sequences of the cervical spine, to include the craniocervical junction and cervicothoracic junction, were obtained without intravenous contrast.  CONTRAST:  10mL MULTIHANCE GADOBENATE DIMEGLUMINE 529 MG/ML IV SOLN COMPARISON:  CT HEAD and CTA HEAD and neck Mar 10, 2017 FINDINGS: MRI HEAD FINDINGS INTRACRANIAL CONTENTS: No reduced diffusion to suggest acute ischemia. No susceptibility artifact to suggest hemorrhage. The ventricles and sulci are normal for patient's age. No suspicious parenchymal signal, masses, mass effect. No abnormal intraparenchymal or extra-axial enhancement. Perivascular space RIGHT thalamus. No abnormal extra-axial fluid collections. Mildly prominent extra-axial spaces at the convexity, likely normal variant without corroborative findings of intracranial hypotension such as sagging midbrain or dural enhancement. No extra-axial masses. VASCULAR: Normal major intracranial vascular flow voids present at skull base. SKULL AND UPPER CERVICAL SPINE: No abnormal sellar expansion. No suspicious calvarial bone marrow signal. Craniocervical junction maintained. SINUSES/ORBITS: The mastoid air-cells and included paranasal sinuses are well-aerated.The included ocular globes and orbital contents are non-suspicious. Status post bilateral ocular lens implants. OTHER: None. MRI CERVICAL SPINE FINDINGS ALIGNMENT: Maintained cervical lordosis.  No malalignment. VERTEBRAE/DISCS: Vertebral bodies are intact. Moderate to severe C5-6 disc height loss, moderate C4-5 and mild at C6-7 with proportional chronic discogenic endplate changes. Disc desiccation all cervical levels. Mild periarticular bright STIR signal LEFT C3-4 facets. CORD:Cervical spinal cord is normal morphology and signal characteristics from the cervicomedullary junction to level of T3-4, the most caudal well visualized level. POSTERIOR FOSSA, VERTEBRAL ARTERIES, PARASPINAL TISSUES: No MR findings of ligamentous injury. Vertebral artery flow voids present. Included posterior fossa and paraspinal soft tissues are normal. DISC LEVELS: C2-3: No disc bulge, canal stenosis nor neural foraminal  narrowing.  C3-4: Annular bulging, uncovertebral hypertrophy and tiny central disc protrusion. Mild facet arthropathy. No canal stenosis or neural foraminal narrowing. C4-5: Small broad-based disc bulge, uncovertebral hypertrophy and mild facet arthropathy. No canal stenosis. Mild RIGHT neural foraminal narrowing. C5-6: Small broad-based disc bulge, uncovertebral hypertrophy and mild facet arthropathy. Mild canal stenosis. Mild RIGHT, moderate LEFT neural foraminal narrowing. C6-7: Small broad-based disc bulge. Uncovertebral hypertrophy. No canal stenosis. Mild LEFT neural foraminal narrowing. C7-T1: No disc bulge, canal stenosis nor neural foraminal narrowing. IMPRESSION: MRI head: Negative MRI of the head for age with and without contrast. MRI cervical spine: Degenerative cervical spine resulting in mild canal stenosis at C5-6. Neural foraminal narrowing C4-5 through C6-7: Moderate on the LEFT at C5-6. LEFT C3-4 facet arthropathy and mild acute reactive changes. Electronically Signed   By: Awilda Metro M.D.   On: 03/10/2017 18:53      No results found for: HGBA1C Lab Results  Component Value Date   CREATININE 0.45 03/11/2017       Scheduled Meds: . acidophilus  1 capsule Oral Daily  . BIEST/PROGESTERONE  1 application Transdermal Daily  . cyclobenzaprine  5 mg Oral TID  . docusate sodium  200 mg Oral BID  . gabapentin  400 mg Oral TID  . latanoprost  1 drop Both Eyes QHS  . multivitamin with minerals  1 tablet Oral Daily  . oxyCODONE  20 mg Oral Q12H  . pantoprazole  40 mg Oral Q0600  . progesterone  100 mg Oral QHS  . thyroid  60 mg Oral QAC breakfast   Continuous Infusions: . sodium chloride 50 mL/hr at 03/10/17 2303     LOS: 0 days    Time spent: >30 MINS    Richarda Overlie  [facility] Pager [phone]. If 7PM-7AM, please contact night-coverage at [email or web address], password [facility] 03/11/2017, 8:38 AM  LOS: 0 days

## 2017-03-11 NOTE — Evaluation (Signed)
Occupational Therapy Evaluation Patient Details Name: BAY JARQUIN MRN: [id number] DOB: Feb 12, 1946 Today's Date: 03/11/2017    History of Present Illness Pt is a 71 yo female who comes in with intractable HA. She recently underwent lumbar fusion surgery. The neck pain radiates to the posterior head and also reports some difficulty swallowing. She reports photophobia and basely able to open eyes due to light sensitivity. Pt being tested for meningitis. PMH significant for chronic back pain, glaucoma, headache, history of blood transfusion, history of colon polyps, hypothyroidism, perforated ulcer, peripheral neuropathy, and pneumonia.   Clinical Impression   Pt with recent history of lumbar fusion and was requiring increased time but no physical assistance for ADL post-acute D/C. Pt currently limited by radiating neck pain, R LE pain, and decreased activity tolerance requiring min assist for toilet transfers and min guard assist for LB ADL. Pt demonstrating slight light sensitivity this session but reports that this is improving. She would benefit from continued OT services while admitted to improve independence with ADL and functional mobility in order to facilitate return to PLOF. Recommend 24 hour assistance post-acute D/C from family. OT will continue to follow while admitted in preparation for return home.     Follow Up Recommendations  No OT follow up;Supervision/Assistance - 24 hour    Equipment Recommendations  3 in 1 bedside commode    Recommendations for Other Services       Precautions / Restrictions Precautions Precautions: Back Precaution Booklet Issued: Yes (comment) Precaution Comments: Reviewed handout provided at previous evaluation by PT post-surgery. Required Braces or Orthoses: Spinal Brace Spinal Brace: Applied in sitting position Restrictions Weight Bearing Restrictions: No      Mobility Bed Mobility Overal bed mobility: Needs Assistance Bed Mobility:  Rolling;Sidelying to Sit;Sit to Sidelying Rolling: Supervision Sidelying to sit: Supervision     Sit to sidelying: Supervision General bed mobility comments: VC's for log roll technique and for safety.   Transfers Overall transfer level: Needs assistance Equipment used: None Transfers: Sit to/from Stand Sit to Stand: Min guard         General transfer comment: Min guard for stability in rising to stand. With initial stand, pt requiring multiple attempts due to loss of balance.     Balance Overall balance assessment: Needs assistance Sitting-balance support: No upper extremity supported;Feet supported Sitting balance-Leahy Scale: Good   Postural control: Posterior lean (at times during mobility) Standing balance support: No upper extremity supported;During functional activity;Single extremity supported Standing balance-Leahy Scale: Fair Standing balance comment: Able to statically stand without UE support but relies on min assist and/or single UE support for stability during dynamic standing tasks.                            ADL either performed or assessed with clinical judgement   ADL Overall ADL's : Needs assistance/impaired Eating/Feeding: Set up;Sitting   Grooming: Minimal assistance;Standing Grooming Details (indicate cue type and reason): Min assist for balance as pt with posterior LOB at times. Upper Body Bathing: Sitting;Supervision/ safety   Lower Body Bathing: Min guard;Sit to/from stand   Upper Body Dressing : Sitting;Minimal assistance Upper Body Dressing Details (indicate cue type and reason): VC's and min assist for brace application technique. Lower Body Dressing: Min guard;Sit to/from stand Lower Body Dressing Details (indicate cue type and reason): Able to cross B legs to don socks. Pt requiring increased time and effort with pain to don R sock.  Toilet Transfer:  Minimal assistance;Ambulation;BSC Toilet Transfer Details (indicate cue type and  reason): Min assist for safety and to correct multiple LOB.  Toileting- Architect and Hygiene: Min guard;Sit to/from stand       Functional mobility during ADLs: Minimal assistance General ADL Comments: Pt requiring min handheld assistance for toilet transfer and demonstrated LOB posteriorly and to both sides. Pt reporting some light sensitivity but able to tolerate hallway lighting. Limited activity tolerance prior to radiating neck pain returning.     Vision Baseline Vision/History: No visual deficits Patient Visual Report: Other (comment) (Light sensitivity) Vision Assessment?: Vision impaired- to be further tested in functional context Additional Comments: Pt able to utilize vision functionally in hallway and in room. Remains sensitive to light but this is improving and she was able to maintain eyes open throughout session.      Perception     Praxis      Pertinent Vitals/Pain Pain Assessment: 0-10 Pain Score: 7  Pain Location: neck and back Pain Descriptors / Indicators: Aching;Discomfort;Radiating Pain Intervention(s): Monitored during session;Repositioned     Hand Dominance Right   Extremity/Trunk Assessment Upper Extremity Assessment Upper Extremity Assessment: Generalized weakness   Lower Extremity Assessment Lower Extremity Assessment: Defer to PT evaluation RLE Deficits / Details: some modest RLE weakness in comparison to LLE 4-/5 gross motions   Cervical / Trunk Assessment Cervical / Trunk Assessment: Other exceptions Cervical / Trunk Exceptions: s/p spinal surgery   Communication Communication Communication: No difficulties   Cognition Arousal/Alertness: Awake/alert Behavior During Therapy: WFL for tasks assessed/performed Overall Cognitive Status: No family/caregiver present to determine baseline cognitive functioning                                 General Comments: Pt with increased distractability this session as compared to  previous admission likely due to pain. Decreased safety awareness noted as pt standing without assistance despite education to avoid this.   General Comments       Exercises     Shoulder Instructions      Home Living Family/patient expects to be discharged to:: Private residence Living Arrangements: Spouse/significant other Available Help at Discharge: Family;Available 24 hours/day Type of Home: House Home Access: Stairs to enter [employer] of Steps: 4 Entrance Stairs-Rails: Can reach both Home Layout: Two level Alternate Level Stairs-Number of Steps: 12 Alternate Level Stairs-Rails: Can reach both Bathroom Shower/Tub: Producer, television/film/video: Handicapped height     Home Equipment: None          Prior Functioning/Environment Level of Independence: Independent        Comments: Pt was home and mobilizing well after prior back surgery. Completing ADL without physical assistance with increased time and effort,.        OT Problem List: Decreased strength;Decreased range of motion;Decreased activity tolerance;Impaired balance (sitting and/or standing);Decreased safety awareness;Decreased knowledge of use of DME or AE;Decreased knowledge of precautions;Pain      OT Treatment/Interventions: Self-care/ADL training;Therapeutic exercise;Energy conservation;DME and/or AE instruction;Therapeutic activities;Patient/family education;Balance training;Visual/perceptual remediation/compensation    OT Goals(Current goals can be found in the care plan section) Acute Rehab OT Goals Patient Stated Goal: to go home OT Goal Formulation: With patient Time For Goal Achievement: 03/25/17 Potential to Achieve Goals: Good ADL Goals Pt Will Perform Grooming: with modified independence;standing (including gathering items) Pt Will Perform Lower Body Bathing: with modified independence;sit to/from stand (with compensatory strategies) Pt Will Perform Lower Body Dressing: with  modified independence;sit to/from stand (with compensatory strategies) Pt Will Transfer to Toilet: with modified independence;ambulating;bedside commode (BSC over toilet) Pt Will Perform Toileting - Clothing Manipulation and hygiene: with modified independence;sit to/from stand Pt Will Perform Tub/Shower Transfer: Shower transfer;with modified independence;ambulating;3 in 1 Additional ADL Goal #1: Pt will demonstrate improved activity tolerance to complete 3 consecutive grooming tasks at sink with no therapeutic rest breaks.   OT Frequency: Min 2X/week   Barriers to D/C:            Co-evaluation PT/OT/SLP Co-Evaluation/Treatment: Yes Reason for Co-Treatment: For patient/therapist safety   OT goals addressed during session: ADL's and self-care      AM-PAC PT "6 Clicks" Daily Activity     Outcome Measure Help from another person eating meals?: A Little Help from another person taking care of personal grooming?: A Little Help from another person toileting, which includes using toliet, bedpan, or urinal?: A Little Help from another person bathing (including washing, rinsing, drying)?: A Little Help from another person to put on and taking off regular upper body clothing?: A Little Help from another person to put on and taking off regular lower body clothing?: A Little 6 Click Score: 18   End of Session Equipment Utilized During Treatment: Back brace Nurse Communication: Mobility status;Other (comment) (LP planned for this afternoon)  Activity Tolerance: Patient tolerated treatment well Patient left: in bed;with call bell/phone within reach  OT Visit Diagnosis: Unsteadiness on feet (R26.81);Pain;Muscle weakness (generalized) (M62.81) Pain - Right/Left: Right Pain - part of body: Leg (back)                Time: 1245-8099 OT Time Calculation (min): 24 min Charges:  OT General Charges $OT Visit: 1 Procedure OT Evaluation $OT Eval Moderate Complexity: 1 Procedure G-Codes: OT  G-codes **NOT FOR INPATIENT CLASS** Functional Assessment Tool Used: Clinical judgement Functional Limitation: Self care Self Care Current Status (I3382): At least 20 percent but less than 40 percent impaired, limited or restricted Self Care Goal Status (N0539): At least 1 percent but less than 20 percent impaired, limited or restricted   Doristine Section, MS OTR/L  Pager: [phone]   Hermenia Fritcher A [place] Trawick 03/11/2017, 3:55 PM

## 2017-03-11 NOTE — Care Management Note (Signed)
Case Management Note  Patient Details  Name: Ann Bell MRN: [id number] Date of Birth: 1946/08/09  Subjective/Objective:   Pt in with intractable HA. She recently underwent lumbar fusion. She is from home with her spouse.                  Action/Plan: Plan is for LP today. Awaiting PT/OT recommendations. CM following for d/c needs, physician orders.   Expected Discharge Date:                  Expected Discharge Plan:     In-House Referral:     Discharge planning Services     Post Acute Care Choice:    Choice offered to:     DME Arranged:    DME Agency:     HH Arranged:    HH Agency:     Status of Service:     If discussed at [employer], dates discussed:    Additional Comments:  Kermit Balo, RN 03/11/2017, 11:02 AM

## 2017-03-11 NOTE — Consult Note (Signed)
CC:  Chief Complaint  Patient presents with  . Neck Pain    HPI: Ann Bell is a 71 y.o. female who presented to the ER for headaches and neck stiffness yesterday. She is s/p lumbar fusion 02/28/2017. She called the on call on Sunday and reported she slept on her neck wrong and started having neck pain that started that am and was also having headaches with dizziness & photophobia. Reports history of migraine with aura and this felt the same as that. Denied fever. She was placed on a muscle relaxer. Reports pain increased in severity over night (10/10) so she called the office again yesterday. She was advised to go to ER for meningitis rule out. She reports continued neck pain since being in the hospital. Continues to have photophobia and headache, again which feels similar to her history of migraines. No fever to date. She denies weakness, bowel or bladder dysfunction, N/T of extremities.  PMH: Past Medical History:  Diagnosis Date  . Chronic back pain    spondylolisthesis  . Glaucoma   . Headache   . History of blood transfusion    no abnormal reaction noted  . History of colon polyps    benign  . Hypothyroidism    takes NP Thyroid  . Perforated ulcer (HCC)   . Peripheral neuropathy   . Pneumonia    hx of-yrs ago    PSH: Past Surgical History:  Procedure Laterality Date  . ABDOMINAL HYSTERECTOMY    . ANTERIOR LAT LUMBAR FUSION N/A 02/28/2017   Procedure: Lumbar four-five Transpsoas lateral interbody fusion with Lumbar four-five Percutaneous pedicle screw fixation, posterolateral arthrodesis with  Mazor ;  Surgeon: Ditty, Loura Halt, MD;  Location: [facility] OR;  Service: Neurosurgery;  Laterality: N/A;  . APPENDECTOMY  1965  . APPLICATION OF ROBOTIC ASSISTANCE FOR SPINAL PROCEDURE N/A 02/28/2017   Procedure: APPLICATION OF ROBOTIC ASSISTANCE FOR SPINAL PROCEDURE;  Surgeon: Ditty, Loura Halt, MD;  Location: [facility] OR;  Service: Neurosurgery;  Laterality: N/A;  . cataract surgery  Bilateral   . CESAREAN SECTION     x 2   . COLONOSCOPY WITH ESOPHAGOGASTRODUODENOSCOPY (EGD)    . cyst removed from ovary  1965  . EPIDURAL BLOCK INJECTION     several times  . ganglion cyst removed from throat    . LUMBAR PERCUTANEOUS PEDICLE SCREW 1 LEVEL N/A 02/28/2017   Procedure: LUMBAR PERCUTANEOUS PEDICLE SCREW LUMBAR FOUR-FIVE;  Surgeon: Ditty, Loura Halt, MD;  Location: [facility] OR;  Service: Neurosurgery;  Laterality: N/A;    SH: Social History  Substance Use Topics  . Smoking status: Former Smoker    Packs/day: 0.50    Years: 20.00    Types: Cigarettes    Quit date: 06/07/1978  . Smokeless tobacco: Never Used  . Alcohol use Yes    MEDS: Prior to Admission medications   Medication Sig Start Date End Date Taking? Authorizing Provider  cyclobenzaprine (FLEXERIL) 10 MG tablet Take 10 mg by mouth 3 (three) times daily as needed for muscle spasms.   Yes [provider]  docusate sodium (COLACE) 100 MG capsule Take 1 capsule (100 mg total) by mouth 2 (two) times daily. 03/02/17  Yes Tressie Stalker, MD  Estradiol-Estriol-Progesterone (BIEST/PROGESTERONE) CREA Place 1 application onto the skin daily.   Yes [provider]  gabapentin (NEURONTIN) 300 MG capsule Take 1 capsule (300 mg total) by mouth 3 (three) times daily. 03/02/17  Yes Tressie Stalker, MD  latanoprost (XALATAN) 0.005 % ophthalmic solution Place 1  drop into both eyes at bedtime.   Yes [provider]  Multiple Vitamin (MULTIVITAMIN WITH MINERALS) TABS tablet Take 1 tablet by mouth daily.   Yes [provider]  Nutritional Supplements (DHEA PO) Take 1 capsule by mouth daily.   Yes [provider]  oxyCODONE (OXY IR/ROXICODONE) 5 MG immediate release tablet Take 1-2 tablets (5-10 mg total) by mouth every 3 (three) hours as needed for breakthrough pain. 03/02/17  Yes Tressie Stalker, MD  oxyCODONE (OXYCONTIN) 20 mg 12 hr tablet Take 1 tablet (20 mg total) by mouth every 12  (twelve) hours. 03/02/17  Yes Tressie Stalker, MD  Probiotic Product (ALIGN) 4 MG CAPS Take 4 mg by mouth daily.   Yes [provider]  progesterone (PROMETRIUM) 100 MG capsule Take 100 mg by mouth at bedtime.   Yes [provider]  ranitidine (ZANTAC) 75 MG tablet Take 75 mg by mouth daily as needed for heartburn.   Yes [provider]  thyroid (NP THYROID) 60 MG tablet Take 60 mg by mouth daily before breakfast.   Yes [provider]    ALLERGY: Allergies  Allergen Reactions  . Codeine Nausea And Vomiting    ROS: Review of Systems  Constitutional: Negative for chills, diaphoresis and fever.  HENT: Negative.   Eyes: Positive for photophobia. Negative for blurred vision and double vision.  Cardiovascular: Negative.   Gastrointestinal: Negative for nausea and vomiting.  Musculoskeletal: Positive for back pain, myalgias and neck pain.  Skin: Negative.   Neurological: Positive for dizziness and headaches. Negative for tingling, tremors, sensory change, speech change, focal weakness, seizures, loss of consciousness and weakness.    Vitals:   03/11/17 0111 03/11/17 0546  BP: (!) 117/59 (!) 123/58  Pulse: 82 82  Resp: 18 18  Temp: 98.6 F (37 C) 97.5 F (36.4 C)   General appearance: WDWN, Nontoxic in appearance Eyes: PERRL Musculoskeletal:     Muscle tone upper extremities: Normal    Muscle tone lower extremities: Normal    Motor exam: Upper Extremities Deltoid Bicep Tricep Grip  Right 5/5 5/5 5/5 5/5  Left 5/5 5/5 5/5 5/5   Lower Extremity IP Quad PF DF EHL  Right 5/5 5/5 5/5 5/5 5/5  Left 5/5 5/5 5/5 5/5 5/5   Neurological Awake, alert, oriented Memory and concentration grossly intact Speech fluent, appropriate CNII: Visual fields normal CNIII/IV/VI: EOMI CNV: Facial sensation normal CNVII: Symmetric, normal strength CNVIII: Grossly normal CNIX: Normal palate movement CNXI: Trap and SCM strength normal. TTP upper traps b/l and  cervical paraspinal muscles. CN XII: Tongue protrusion normal Sensation grossly intact to LT  IMAGING: MRI Brain/Cspine IMPRESSION: MRI head: Negative MRI of the head for age with and without contrast. MRI cervical spine: Degenerative cervical spine resulting in mild canal stenosis at C5-6. Neural foraminal narrowing C4-5 through C6-7: Moderate on the LEFT at C5-6. LEFT C3-4 facet arthropathy and mild acute reactive changes.  IMPRESSION/PLAN - 71 y.o. female with neck pain, headache, dizziness and photophobia s/p lumbar fusion 02/28/2017. She believes she slept wrong on her neck Saturdaypm/Sunday am which caused her neck pain. She reports her HA, dizziness and photophobia feels similar to her migraines with aura. She is neurologically intact. MRIs are unremarkble. Labs only show mild leukocytosis which is not very alarming. I have discussed the case with Dr Bevely Palmer who has also examined the patient. Based on her surgery and how far away from the thecal sac he was operatively, it is highly unlikely that she  has bacterial meningitis from surgery. Rec continuing with current plan for [facility] LP for further r/o of meningitis. Will await LP results.

## 2017-03-11 NOTE — Consult Note (Signed)
NEURO HOSPITALIST CONSULT NOTE   Requestig physician: Dr. Susie Cassette   Reason for Consult: possible post neurosurgical meningitis   History obtained from:  Patient     HPI:                                                                                                                                          Ann Bell is an 71 y.o. female presented to ED with neck pain and headache about 7 days after having lumbar fusion surgery.  The neck pain radiates to the posterior head and also reports some difficulty swallowing. She reports photophobia and barely able to open eyes due to light sensitivity.  She has never experienced this before.  The pain is at the base of the skull and feels muscular to her but she is not able to tolerate palpation of the area without significant pain and asking the examiner to please stop.  She has some post op weakness in her thighs but no loss of bowel or bladder function and no fever chills nausea or vomiting.   She says she tolerated the lumbar fusion well and had unremarkable postop course until 3 days ago.  She had lumbar fusion of L4-L5 by Dr. Bevely Palmer on May 4. Her postop course was unremarkable and she was discharged on 03/02/17. 3 days ago she started to develop gradually worsening neck pain radiating to the posterior head.  Over the weekend she called Dr. Mervyn Gay office who prescribed her a muscle relaxer which she has been taking in addition to prescribed oxycodone 20mg  ER and 5-10mg  IR prn. This has not provided significant relief. This morning the pain was severe and worse with any movement of the neck therefore theycalled Dr. Mervyn Gay office again who recommended her to come to the ED for evaluation for meningitis. Due to her recent surgery neurology and IR   feel it is too risky to perform a lumbar puncture. Neurology was asked to evaluate the patient  Currently she is afebrile, blood pressure 126/62, white blood cell count initially 13 now down  to 11  Past Medical History:  Diagnosis Date  . Chronic back pain    spondylolisthesis  . Glaucoma   . Headache   . History of blood transfusion    no abnormal reaction noted  . History of colon polyps    benign  . Hypothyroidism    takes NP Thyroid  . Perforated ulcer (HCC)   . Peripheral neuropathy   . Pneumonia    hx of-yrs ago    Past Surgical History:  Procedure Laterality Date  . ABDOMINAL HYSTERECTOMY    . ANTERIOR LAT LUMBAR FUSION N/A 02/28/2017   Procedure: Lumbar four-five Transpsoas lateral interbody fusion with Lumbar four-five Percutaneous pedicle screw fixation, posterolateral arthrodesis  with  Mazor ;  Surgeon: Ditty, Loura Halt, MD;  Location: [facility] OR;  Service: Neurosurgery;  Laterality: N/A;  . APPENDECTOMY  1965  . APPLICATION OF ROBOTIC ASSISTANCE FOR SPINAL PROCEDURE N/A 02/28/2017   Procedure: APPLICATION OF ROBOTIC ASSISTANCE FOR SPINAL PROCEDURE;  Surgeon: Ditty, Loura Halt, MD;  Location: [facility] OR;  Service: Neurosurgery;  Laterality: N/A;  . cataract surgery Bilateral   . CESAREAN SECTION     x 2   . COLONOSCOPY WITH ESOPHAGOGASTRODUODENOSCOPY (EGD)    . cyst removed from ovary  1965  . EPIDURAL BLOCK INJECTION     several times  . ganglion cyst removed from throat    . LUMBAR PERCUTANEOUS PEDICLE SCREW 1 LEVEL N/A 02/28/2017   Procedure: LUMBAR PERCUTANEOUS PEDICLE SCREW LUMBAR FOUR-FIVE;  Surgeon: Ditty, Loura Halt, MD;  Location: [facility] OR;  Service: Neurosurgery;  Laterality: N/A;    History reviewed. No pertinent family history.    Social History:  reports that she quit smoking about 38 years ago. Her smoking use included Cigarettes. She has a 10.00 pack-year smoking history. She has never used smokeless tobacco. She reports that she drinks alcohol. She reports that she does not use drugs.  Allergies  Allergen Reactions  . Codeine Nausea And Vomiting    MEDICATIONS:                                                                                                                      Scheduled: . acidophilus  1 capsule Oral Daily  . BIEST/PROGESTERONE  1 application Transdermal Daily  . cyclobenzaprine  5 mg Oral TID  . docusate sodium  200 mg Oral BID  . gabapentin  400 mg Oral TID  . latanoprost  1 drop Both Eyes QHS  . multivitamin with minerals  1 tablet Oral Daily  . oxyCODONE  20 mg Oral Q12H  . pantoprazole  40 mg Oral Q0600  . progesterone  100 mg Oral QHS  . thyroid  60 mg Oral QAC breakfast     ROS:                                                                                                                                       History obtained from the patient  General ROS: negative for - chills, fatigue, fever, night sweats, weight gain or weight loss Psychological ROS: negative for - behavioral disorder, hallucinations, memory  difficulties, mood swings or suicidal ideation Ophthalmic ROS: negative for - blurry vision, double vision, eye pain or loss of vision ENT ROS: negative for - epistaxis, nasal discharge, oral lesions, sore throat, tinnitus or vertigo Allergy and Immunology ROS: negative for - hives or itchy/watery eyes Hematological and Lymphatic ROS: negative for - bleeding problems, bruising or swollen lymph nodes Endocrine ROS: negative for - galactorrhea, hair pattern changes, polydipsia/polyuria or temperature intolerance Respiratory ROS: negative for - cough, hemoptysis, shortness of breath or wheezing Cardiovascular ROS: negative for - chest pain, dyspnea on exertion, edema or irregular heartbeat Gastrointestinal ROS: negative for - abdominal pain, diarrhea, hematemesis, nausea/vomiting or stool incontinence Genito-Urinary ROS: negative for - dysuria, hematuria, incontinence or urinary frequency/urgency Musculoskeletal ROS: negative for - joint swelling or muscular weakness Neurological ROS: as noted in HPI Dermatological ROS: negative for rash and skin lesion changes   Blood pressure  126/62, pulse 81, temperature 97 F (36.1 C), temperature source Axillary, resp. rate 18, height 5\' 3"  (1.6 m), weight 60.6 kg (133 lb 9.6 oz), SpO2 99 %.   Neurologic Examination:                                                                                                      HEENT-  Normocephalic, no lesions, without obvious abnormality.  Normal external eye and conjunctiva.  Normal TM's bilaterally.  Normal auditory canals and external ears. Normal external nose, mucus membranes and septum.  Normal pharynx. Cardiovascular- S1, S2 normal, pulses palpable throughout   Lungs- chest clear, no wheezing, rales, normal symmetric air entry, Heart exam - S1, S2 normal, no murmur, no gallop, rate regular Abdomen- soft, non-tender; bowel sounds normal; no masses,  no organomegaly Extremities- no edema Lymph-no adenopathy palpable Musculoskeletal-no joint tenderness, deformity or swelling Skin-warm and dry, no hyperpigmentation, vitiligo, or suspicious lesions  Neurological Examination Mental Status: Alert, oriented, thought content appropriate.  Speech fluent without evidence of aphasia.  Able to follow 3 step commands without difficulty. Cranial Nerves: ZO:XWRUEA fields grossly normal, positive photophobia III,IV, VI: ptosis not present, extra-ocular motions intact bilaterally, pupils equal, round, reactive to light and accommodation V,VII: smile symmetric, facial light touch sensation normal bilaterally VIII: hearing normal bilaterally IX,X: uvula rises symmetrically XI: bilateral shoulder shrug XII: midline tongue extension -----Patient's neck is supple with head turning to the left and right for approximately 15 however at that point she has significant pain and resists movement. She is able to move her neck from stable position to touch her chin but this does cause pain also. I did not notice any muscular induration however she is very sensitive to palpation in the posterior aspect of her  neck. Negative Lhermitte sign, negative Hoffmann's Motor: Right : Upper extremity   5/5    Left:     Upper extremity   5/5  Lower extremity   5/5     Lower extremity   5/5 Tone and bulk:normal tone throughout; no atrophy noted Sensory: Pinprick and light touch intact throughout, bilaterally Deep Tendon Reflexes: 2+ and symmetric throughout upper extremity with 1+ bilateral knee  jerk no ankle jerk. Mute toes bilaterally Plantars: Right: downgoing   Left: downgoing Cerebellar: normal finger-to-nose,  and normal heel-to-shin test Gait: Not tested      Lab Results: Basic Metabolic Panel:  Recent Labs Lab 03/10/17 0940 03/10/17 2003 03/11/17 0210  NA 136 136 137  K 3.6 4.0 3.5  CL 101 102 104  CO2 26 26 26   GLUCOSE 127* 109* 108*  BUN 8 7 8   CREATININE 0.59 0.52 0.45  CALCIUM 9.2 9.1 8.7*    Liver Function Tests:  Recent Labs Lab 03/10/17 2003 03/11/17 0210  AST 28 22  ALT 25 23  ALKPHOS 88 78  BILITOT 0.9 0.4  PROT 6.4* 6.2*  ALBUMIN 3.6 3.2*   No results for input(s): LIPASE, AMYLASE in the last 168 hours. No results for input(s): AMMONIA in the last 168 hours.  CBC:  Recent Labs Lab 03/10/17 0940 03/10/17 2003 03/11/17 0210  WBC 12.6* 13.0* 11.0*  NEUTROABS  --  9.5* 6.9  HGB 13.8 13.3 12.1  HCT 41.2 39.2 35.8*  MCV 88.6 87.5 87.5  PLT 357 PLATELET CLUMPS NOTED ON SMEAR, UNABLE TO ESTIMATE 369    Cardiac Enzymes:  Recent Labs Lab 03/10/17 2003 03/11/17 0210 03/11/17 0735  TROPONINI <0.03 <0.03 <0.03    Lipid Panel: No results for input(s): CHOL, TRIG, HDL, CHOLHDL, VLDL, LDLCALC in the last 168 hours.  CBG: No results for input(s): GLUCAP in the last 168 hours.  Microbiology: Results for orders placed or performed during the hospital encounter of 03/10/17  Culture, blood (routine x 2)     Status: None (Preliminary result)   Collection Time: 03/10/17  8:09 PM  Result Value Ref Range Status   Specimen Description BLOOD RIGHT  ANTECUBITAL  Final   Special Requests   Final    BOTTLES DRAWN AEROBIC ONLY Blood Culture results may not be optimal due to an inadequate volume of blood received in culture bottles   Culture NO GROWTH < 12 HOURS  Final   Report Status PENDING  Incomplete  Culture, blood (routine x 2)     Status: None (Preliminary result)   Collection Time: 03/10/17  8:09 PM  Result Value Ref Range Status   Specimen Description BLOOD RIGHT HAND  Final   Special Requests   Final    BOTTLES DRAWN AEROBIC ONLY Blood Culture results may not be optimal due to an inadequate volume of blood received in culture bottles   Culture PENDING  Incomplete   Report Status PENDING  Incomplete    Coagulation Studies:  Recent Labs  03/10/17 2003  LABPROT 13.7  INR 1.05    Imaging: Ct Head Wo Contrast  Result Date: 03/10/2017 CLINICAL DATA:  71 year old female with 3 days of neck pain. Difficulty swallowing. Recent lumbar surgery. EXAM: CT HEAD WITHOUT CONTRAST CT ANGIOGRAPHY NECK TECHNIQUE: Multidetector CT imaging of the head without contrast and CTA neck was performed using bolus administration of intravenous contrast for the neck portion. Multiplanar CT image reconstructions and MIPs were obtained to evaluate the vascular anatomy. Carotid stenosis measurements (when applicable) are obtained utilizing NASCET criteria, using the distal internal carotid diameter as the denominator. CONTRAST:  50 mL Isovue 370 COMPARISON:  Cervical spine MRI 12/11/2010 FINDINGS: CT HEAD Brain: No midline shift, ventriculomegaly, mass effect, evidence of mass lesion, intracranial hemorrhage or evidence of cortically based acute infarction. Gray-white matter differentiation is within normal limits throughout the brain. Calvarium and skull base: Negative. No acute osseous abnormality identified. Paranasal sinuses: Clear. Orbits:  No acute orbit or scalp soft tissue findings. CTA NECK Skeleton: Visualized skull base is intact. No atlanto-occipital  dissociation. Mild degenerative appearing anterolisthesis of C3 on C4 is chronic. Straightening of cervical lordosis. Bilateral posterior element alignment is within normal limits. Chronic disc and endplate degeneration at C5-C6 with mild spinal stenosis. No acute osseous abnormality identified. Upper chest: Negative.  No superior mediastinal lymphadenopathy. Other neck: Thyroid, larynx, pharynx, parapharyngeal spaces, sublingual space, right submandibular gland and parotid glands are within normal limits. The left submandibular gland appears atrophied or surgically absent. Partially retropharyngeal course of the right carotid, otherwise negative retropharyngeal space. No cervical lymphadenopathy. Aortic arch: Negative.  Three vessel arch configuration. Right carotid system: Negative aside from a partially retropharyngeal course. Visible right ICA siphon is patent with minor calcified atherosclerosis. Visible right anterior circulation appears normal. A fetal type right PCA origin is suspected. Left carotid system: Negative aside from mild tortuosity. Visible left ICA siphon is patent with mild calcified plaque. Visible left anterior circulation is within normal limits; non dominant left A1 segment and fetal type left PCA origins are suspected. Vertebral arteries: No proximal subclavian artery atherosclerosis or stenosis. Tortuous but otherwise negative right vertebral artery. Negative left vertebral artery. The vertebral arteries are fairly codominant. PICA origins are patent. The distal vertebral and basilar arteries appear diminutive, which appears to beyond the basis of fetal type PCA origins. SCA origins are patent. Review of the MIP images confirms the above findings IMPRESSION: 1.  Normal for age non contrast CT appearance of the brain. 2. Negative CTA neck aside from carotid and vertebral artery tortuosity. 3. No acute osseous abnormality identified. Cervical spine degeneration appears not significantly  changed since 2012. 4. 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 Electronically Signed   By: Odessa Fleming M.D.   On: 03/10/2017 15:16   Ct Angio Neck W And/or Wo Contrast  Result Date: 03/10/2017 CLINICAL DATA:  71 year old female with 3 days of neck pain. Difficulty swallowing. Recent lumbar surgery. EXAM: CT HEAD WITHOUT CONTRAST CT ANGIOGRAPHY NECK TECHNIQUE: Multidetector CT imaging of the head without contrast and CTA neck was performed using bolus administration of intravenous contrast for the neck portion. Multiplanar CT image reconstructions and MIPs were obtained to evaluate the vascular anatomy. Carotid stenosis measurements (when applicable) are obtained utilizing NASCET criteria, using the distal internal carotid diameter as the denominator. CONTRAST:  50 mL Isovue 370 COMPARISON:  Cervical spine MRI 12/11/2010 FINDINGS: CT HEAD Brain: No midline shift, ventriculomegaly, mass effect, evidence of mass lesion, intracranial hemorrhage or evidence of cortically based acute infarction. Gray-white matter differentiation is within normal limits throughout the brain. Calvarium and skull base: Negative. No acute osseous abnormality identified. Paranasal sinuses: Clear. Orbits: No acute orbit or scalp soft tissue findings. CTA NECK Skeleton: Visualized skull base is intact. No atlanto-occipital dissociation. Mild degenerative appearing anterolisthesis of C3 on C4 is chronic. Straightening of cervical lordosis. Bilateral posterior element alignment is within normal limits. Chronic disc and endplate degeneration at C5-C6 with mild spinal stenosis. No acute osseous abnormality identified. Upper chest: Negative.  No superior mediastinal lymphadenopathy. Other neck: Thyroid, larynx, pharynx, parapharyngeal spaces, sublingual space, right submandibular gland and parotid glands are within normal limits. The left submandibular gland appears atrophied or  surgically absent. Partially retropharyngeal course of the right carotid, otherwise negative retropharyngeal space. No cervical lymphadenopathy. Aortic arch: Negative.  Three vessel arch configuration. Right carotid system: Negative aside from a partially retropharyngeal course. Visible right  ICA siphon is patent with minor calcified atherosclerosis. Visible right anterior circulation appears normal. A fetal type right PCA origin is suspected. Left carotid system: Negative aside from mild tortuosity. Visible left ICA siphon is patent with mild calcified plaque. Visible left anterior circulation is within normal limits; non dominant left A1 segment and fetal type left PCA origins are suspected. Vertebral arteries: No proximal subclavian artery atherosclerosis or stenosis. Tortuous but otherwise negative right vertebral artery. Negative left vertebral artery. The vertebral arteries are fairly codominant. PICA origins are patent. The distal vertebral and basilar arteries appear diminutive, which appears to beyond the basis of fetal type PCA origins. SCA origins are patent. Review of the MIP images confirms the above findings IMPRESSION: 1.  Normal for age non contrast CT appearance of the brain. 2. Negative CTA neck aside from carotid and vertebral artery tortuosity. 3. No acute osseous abnormality identified. Cervical spine degeneration appears not significantly changed since 2012. 4. 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 Electronically Signed   By: Odessa Fleming M.D.   On: 03/10/2017 15:16   Mr Laqueta Jean ZO Contrast  Result Date: 03/10/2017 CLINICAL DATA:  Neck pain and headache for 1 week after lumbar spine surgery. Difficulty swallowing, photophobia. Evaluate intractable headache. EXAM: MRI HEAD WITHOUT AND WITH CONTRAST MRI CERVICAL SPINE WITHOUT CONTRAST TECHNIQUE: Multiplanar, multiecho pulse sequences of the brain and surrounding structures were  obtained without and with intravenous contrast. Multiplanar multi echo pulse sequences of the cervical spine, to include the craniocervical junction and cervicothoracic junction, were obtained without intravenous contrast. CONTRAST:  10mL MULTIHANCE GADOBENATE DIMEGLUMINE 529 MG/ML IV SOLN COMPARISON:  CT HEAD and CTA HEAD and neck Mar 10, 2017 FINDINGS: MRI HEAD FINDINGS INTRACRANIAL CONTENTS: No reduced diffusion to suggest acute ischemia. No susceptibility artifact to suggest hemorrhage. The ventricles and sulci are normal for patient's age. No suspicious parenchymal signal, masses, mass effect. No abnormal intraparenchymal or extra-axial enhancement. Perivascular space RIGHT thalamus. No abnormal extra-axial fluid collections. Mildly prominent extra-axial spaces at the convexity, likely normal variant without corroborative findings of intracranial hypotension such as sagging midbrain or dural enhancement. No extra-axial masses. VASCULAR: Normal major intracranial vascular flow voids present at skull base. SKULL AND UPPER CERVICAL SPINE: No abnormal sellar expansion. No suspicious calvarial bone marrow signal. Craniocervical junction maintained. SINUSES/ORBITS: The mastoid air-cells and included paranasal sinuses are well-aerated.The included ocular globes and orbital contents are non-suspicious. Status post bilateral ocular lens implants. OTHER: None. MRI CERVICAL SPINE FINDINGS ALIGNMENT: Maintained cervical lordosis.  No malalignment. VERTEBRAE/DISCS: Vertebral bodies are intact. Moderate to severe C5-6 disc height loss, moderate C4-5 and mild at C6-7 with proportional chronic discogenic endplate changes. Disc desiccation all cervical levels. Mild periarticular bright STIR signal LEFT C3-4 facets. CORD:Cervical spinal cord is normal morphology and signal characteristics from the cervicomedullary junction to level of T3-4, the most caudal well visualized level. POSTERIOR FOSSA, VERTEBRAL ARTERIES, PARASPINAL  TISSUES: No MR findings of ligamentous injury. Vertebral artery flow voids present. Included posterior fossa and paraspinal soft tissues are normal. DISC LEVELS: C2-3: No disc bulge, canal stenosis nor neural foraminal narrowing. C3-4: Annular bulging, uncovertebral hypertrophy and tiny central disc protrusion. Mild facet arthropathy. No canal stenosis or neural foraminal narrowing. C4-5: Small broad-based disc bulge, uncovertebral hypertrophy and mild facet arthropathy. No canal stenosis. Mild RIGHT neural foraminal narrowing. C5-6: Small broad-based disc bulge, uncovertebral hypertrophy and mild facet arthropathy. Mild canal stenosis. Mild RIGHT, moderate LEFT neural foraminal narrowing. C6-7: Small  broad-based disc bulge. Uncovertebral hypertrophy. No canal stenosis. Mild LEFT neural foraminal narrowing. C7-T1: No disc bulge, canal stenosis nor neural foraminal narrowing. IMPRESSION: MRI head: Negative MRI of the head for age with and without contrast. MRI cervical spine: Degenerative cervical spine resulting in mild canal stenosis at C5-6. Neural foraminal narrowing C4-5 through C6-7: Moderate on the LEFT at C5-6. LEFT C3-4 facet arthropathy and mild acute reactive changes. Electronically Signed   By: Awilda Metro M.D.   On: 03/10/2017 18:53   Mr Cervical Spine Wo Contrast  Result Date: 03/10/2017 CLINICAL DATA:  Neck pain and headache for 1 week after lumbar spine surgery. Difficulty swallowing, photophobia. Evaluate intractable headache. EXAM: MRI HEAD WITHOUT AND WITH CONTRAST MRI CERVICAL SPINE WITHOUT CONTRAST TECHNIQUE: Multiplanar, multiecho pulse sequences of the brain and surrounding structures were obtained without and with intravenous contrast. Multiplanar multi echo pulse sequences of the cervical spine, to include the craniocervical junction and cervicothoracic junction, were obtained without intravenous contrast. CONTRAST:  10mL MULTIHANCE GADOBENATE DIMEGLUMINE 529 MG/ML IV SOLN  COMPARISON:  CT HEAD and CTA HEAD and neck Mar 10, 2017 FINDINGS: MRI HEAD FINDINGS INTRACRANIAL CONTENTS: No reduced diffusion to suggest acute ischemia. No susceptibility artifact to suggest hemorrhage. The ventricles and sulci are normal for patient's age. No suspicious parenchymal signal, masses, mass effect. No abnormal intraparenchymal or extra-axial enhancement. Perivascular space RIGHT thalamus. No abnormal extra-axial fluid collections. Mildly prominent extra-axial spaces at the convexity, likely normal variant without corroborative findings of intracranial hypotension such as sagging midbrain or dural enhancement. No extra-axial masses. VASCULAR: Normal major intracranial vascular flow voids present at skull base. SKULL AND UPPER CERVICAL SPINE: No abnormal sellar expansion. No suspicious calvarial bone marrow signal. Craniocervical junction maintained. SINUSES/ORBITS: The mastoid air-cells and included paranasal sinuses are well-aerated.The included ocular globes and orbital contents are non-suspicious. Status post bilateral ocular lens implants. OTHER: None. MRI CERVICAL SPINE FINDINGS ALIGNMENT: Maintained cervical lordosis.  No malalignment. VERTEBRAE/DISCS: Vertebral bodies are intact. Moderate to severe C5-6 disc height loss, moderate C4-5 and mild at C6-7 with proportional chronic discogenic endplate changes. Disc desiccation all cervical levels. Mild periarticular bright STIR signal LEFT C3-4 facets. CORD:Cervical spinal cord is normal morphology and signal characteristics from the cervicomedullary junction to level of T3-4, the most caudal well visualized level. POSTERIOR FOSSA, VERTEBRAL ARTERIES, PARASPINAL TISSUES: No MR findings of ligamentous injury. Vertebral artery flow voids present. Included posterior fossa and paraspinal soft tissues are normal. DISC LEVELS: C2-3: No disc bulge, canal stenosis nor neural foraminal narrowing. C3-4: Annular bulging, uncovertebral hypertrophy and tiny  central disc protrusion. Mild facet arthropathy. No canal stenosis or neural foraminal narrowing. C4-5: Small broad-based disc bulge, uncovertebral hypertrophy and mild facet arthropathy. No canal stenosis. Mild RIGHT neural foraminal narrowing. C5-6: Small broad-based disc bulge, uncovertebral hypertrophy and mild facet arthropathy. Mild canal stenosis. Mild RIGHT, moderate LEFT neural foraminal narrowing. C6-7: Small broad-based disc bulge. Uncovertebral hypertrophy. No canal stenosis. Mild LEFT neural foraminal narrowing. C7-T1: No disc bulge, canal stenosis nor neural foraminal narrowing. IMPRESSION: MRI head: Negative MRI of the head for age with and without contrast. MRI cervical spine: Degenerative cervical spine resulting in mild canal stenosis at C5-6. Neural foraminal narrowing C4-5 through C6-7: Moderate on the LEFT at C5-6. LEFT C3-4 facet arthropathy and mild acute reactive changes. Electronically Signed   By: Awilda Metro M.D.   On: 03/10/2017 18:53       Assessment and plan per attending neurologist  Felicie Morn PA-C [facility] [phone]  03/11/2017,  10:44 AM   Assessment/Plan: This is 71 year old female with possible post neurosurgical operative meningitis.

## 2017-03-11 NOTE — Evaluation (Signed)
Clinical/Bedside Swallow Evaluation Patient Details  Name: Ann Bell MRN: [id number] Date of Birth: [birth year]  Today's Date: 03/11/2017 Time: SLP Start Time (ACUTE ONLY): 1023 SLP Stop Time (ACUTE ONLY): 1040 SLP Time Calculation (min) (ACUTE ONLY): 17 min  Past Medical History:  Past Medical History:  Diagnosis Date  . Chronic back pain    spondylolisthesis  . Glaucoma   . Headache   . History of blood transfusion    no abnormal reaction noted  . History of colon polyps    benign  . Hypothyroidism    takes NP Thyroid  . Perforated ulcer (HCC)   . Peripheral neuropathy   . Pneumonia    hx of-yrs ago   Past Surgical History:  Past Surgical History:  Procedure Laterality Date  . ABDOMINAL HYSTERECTOMY    . ANTERIOR LAT LUMBAR FUSION N/A 02/28/2017   Procedure: Lumbar four-five Transpsoas lateral interbody fusion with Lumbar four-five Percutaneous pedicle screw fixation, posterolateral arthrodesis with  Mazor ;  Surgeon: Ditty, Loura Halt, MD;  Location: [facility] OR;  Service: Neurosurgery;  Laterality: N/A;  . APPENDECTOMY  1965  . APPLICATION OF ROBOTIC ASSISTANCE FOR SPINAL PROCEDURE N/A 02/28/2017   Procedure: APPLICATION OF ROBOTIC ASSISTANCE FOR SPINAL PROCEDURE;  Surgeon: Ditty, Loura Halt, MD;  Location: [facility] OR;  Service: Neurosurgery;  Laterality: N/A;  . cataract surgery Bilateral   . CESAREAN SECTION     x 2   . COLONOSCOPY WITH ESOPHAGOGASTRODUODENOSCOPY (EGD)    . cyst removed from ovary  1965  . EPIDURAL BLOCK INJECTION     several times  . ganglion cyst removed from throat    . LUMBAR PERCUTANEOUS PEDICLE SCREW 1 LEVEL N/A 02/28/2017   Procedure: LUMBAR PERCUTANEOUS PEDICLE SCREW LUMBAR FOUR-FIVE;  Surgeon: Ditty, Loura Halt, MD;  Location: [facility] OR;  Service: Neurosurgery;  Laterality: N/A;   HPI:  CHIQUITTA MATTY a 71 y.o.femalepresented to EDwith neck pain and headache about 7days after having lumbar fusion surgery. The neck pain radiates to the  posterior head and also reports some difficulty swallowing. She reports photophobia and barely able to open eyes due to light sensitivity. Pt being tested for meningitis.    Assessment / Plan / Recommendation Clinical Impression  Pt demonstrates adequate tolerance of solids and liquids. Pt reports minor increase in posterior muscular neck pain with the act of masticating and swallowing, but reports prior globus sensation has resolved. Pt noted to have dry mouth, reprots being dehydrated and having minimal oral intake over the past three days and not performing oral care. SLP assisted pt in oral care and encouraged her to brush her teeth 3x a day , particularly in the hospital. After oral care the lingual mucosa texture and appearance improved significantly. Suspect pts prior  complaint of globus may have been related to lack of saliva. Offered basic precautions. Pt may continue diet, no SLP f/u needed.  SLP Visit Diagnosis: Dysphagia, oropharyngeal phase (R13.12)    Aspiration Risk  Mild aspiration risk    Diet Recommendation Thin liquid;Regular   Liquid Administration via: Cup;Straw Medication Administration: Whole meds with liquid Supervision: Patient able to self feed Compensations: Slow rate;Small sips/bites Postural Changes: Seated upright at 90 degrees    Other  Recommendations Oral Care Recommendations: Oral care BID   Follow up Recommendations None      Frequency and Duration            Prognosis        Swallow Study   General HPI:  Ann Bell a 71 y.o.femalepresented to EDwith neck pain and headache about 7days after having lumbar fusion surgery. The neck pain radiates to the posterior head and also reports some difficulty swallowing. She reports photophobia and barely able to open eyes due to light sensitivity. Pt being tested for meningitis.  Type of Study: Bedside Swallow Evaluation Diet Prior to this Study: Regular;Thin liquids Temperature Spikes Noted:  No Respiratory Status: Room air History of Recent Intubation: No Behavior/Cognition: Alert;Cooperative;Pleasant mood Oral Cavity Assessment: Within Functional Limits Oral Care Completed by SLP: No Oral Cavity - Dentition: Adequate natural dentition Vision: Functional for self-feeding Self-Feeding Abilities: Able to feed self Patient Positioning: Upright in bed Baseline Vocal Quality: Normal Volitional Cough: Strong Volitional Swallow: Able to elicit    Oral/Motor/Sensory Function Overall Oral Motor/Sensory Function: Within functional limits   Ice Chips     Thin Liquid Thin Liquid: Within functional limits Presentation: Cup;Straw;Self Fed    Nectar Thick Nectar Thick Liquid: Not tested   Honey Thick Honey Thick Liquid: Not tested   Puree Puree: Within functional limits Presentation: Self Fed;Spoon   Solid   GO   Solid: Within functional limits Presentation: Self Fed       Harlon Ditty, MA CCC-SLP [phone]  Claudine Mouton 03/11/2017,11:30 AM

## 2017-03-12 DIAGNOSIS — F1129 Opioid dependence with unspecified opioid-induced disorder: Secondary | ICD-10-CM

## 2017-03-12 LAB — COMPREHENSIVE METABOLIC PANEL
ALT: 18 U/L (ref 14–54)
ANION GAP: 9 (ref 5–15)
AST: 20 U/L (ref 15–41)
Albumin: 3 g/dL — ABNORMAL LOW (ref 3.5–5.0)
Alkaline Phosphatase: 75 U/L (ref 38–126)
BUN: 7 mg/dL (ref 6–20)
CHLORIDE: 101 mmol/L (ref 101–111)
CO2: 27 mmol/L (ref 22–32)
Calcium: 9.1 mg/dL (ref 8.9–10.3)
Creatinine, Ser: 0.52 mg/dL (ref 0.44–1.00)
GFR calc Af Amer: >60 mL/min (ref >60)
GLUCOSE: 116 mg/dL — AB (ref 65–99)
POTASSIUM: 3.9 mmol/L (ref 3.5–5.1)
SODIUM: 137 mmol/L (ref 135–145)
TOTAL PROTEIN: 6.3 g/dL — AB (ref 6.5–8.1)
Total Bilirubin: 0.5 mg/dL (ref 0.3–1.2)

## 2017-03-12 LAB — CBC
HEMATOCRIT: 36.6 % (ref 36.0–46.0)
HEMOGLOBIN: 12.3 g/dL (ref 12.0–15.0)
MCH: 29.5 pg (ref 26.0–34.0)
MCHC: 33.6 g/dL (ref 30.0–36.0)
MCV: 87.8 fL (ref 78.0–100.0)
Platelets: 384 10*3/uL (ref 150–400)
RBC: 4.17 MIL/uL (ref 3.87–5.11)
RDW: 12.8 % (ref 11.5–15.5)
WBC: 11.7 10*3/uL — ABNORMAL HIGH (ref 4.0–10.5)

## 2017-03-12 LAB — VDRL, CSF: SYPHILIS VDRL QUANT CSF: NONREACTIVE

## 2017-03-12 MED ORDER — BACLOFEN 10 MG PO TABS
5.0000 mg | ORAL_TABLET | Freq: Three times a day (TID) | ORAL | Status: DC
  Administered 2017-03-12 – 2017-03-14 (×6): 5 mg via ORAL
  Filled 2017-03-12 (×6): qty 1

## 2017-03-12 MED ORDER — POLYETHYLENE GLYCOL 3350 17 G PO PACK
17.0000 g | PACK | Freq: Two times a day (BID) | ORAL | Status: DC
  Administered 2017-03-12 – 2017-03-14 (×4): 17 g via ORAL
  Filled 2017-03-12 (×5): qty 1

## 2017-03-12 MED ORDER — WHITE PETROLATUM GEL
Status: AC
  Administered 2017-03-12: 09:00:00
  Filled 2017-03-12: qty 1

## 2017-03-12 NOTE — Progress Notes (Signed)
Unable to void, bladder soft, distended.  Bladder scan 900cc, In & out cath completed with 950cc return.  Tolerating ice to posterior neck for comfort, remains light sensitive. No nausea or vomiting.

## 2017-03-12 NOTE — Progress Notes (Signed)
PROGRESS NOTE    TAZIA ILLESCAS  LKG:[id number] DOB: [birth year] DOA: 03/10/2017 PCP: Eliott Nine, MD   Brief Narrative: Ann Bell is a 71 y.o. female with a history of hypothyroidism, Spondylolisthesis of lumbosacral. She presented with posterior headache and concern for meningitis, which has been ruled out. Symptom control.   Assessment & Plan:   Active Problems:   Intractable headache   Severe Acute on Chronic Neck pain   Opioid dependence (HCC)   Gait instability   Generalized weakness   Leukocytosis   Hypothyroidism   Intractable headache Evaluated by neurology. Initially thought to be secondary to possible meningitis which has been ruled out via lumbar puncture. Likely related to muscle strain/spasm. -discontinue Flexeril -start Baclofen -taper opioids -continue gabapentin  Opioid dependence Patient uses opiates chronically as an outpatient since having surgery -continue Oxycontin BID -taper prn narcotics -NSAIDs contraindicated secondary to history of perforated ulcer  Hypothyroidism Last TSH of 0.481 - continue Armour 60mg  daily  Generalized weakness -PT evaluation  Acute urinary retention Constipation Likely secondary to opiate use -taper down opiates -Miralax scheduled   DVT prophylaxis: SCDs Code Status: Full code Family Communication: Husband at bedside Disposition Plan: Discharge pending improvement in pain without use of parenteral narcotics   Consultants:   Neurology  Neurosurgery  Procedures:   None  Antimicrobials:  Ceftazidime (5/15)  Vancomycin (5/15)    Subjective: Patient reports continued neck stiffness. Headache still present. No chest pain or dyspnea.  Objective: Vitals:   03/11/17 2155 03/12/17 0109 03/12/17 0647 03/12/17 0939  BP: (!) 126/53 125/61 (!) 139/56 125/66  Pulse: 80 77 72 75  Resp: 18 18 18 19   Temp: 98.2 F (36.8 C) 98.7 F (37.1 C) 99.3 F (37.4 C) 98.5 F (36.9 C)  TempSrc: Oral  Oral Oral Oral  SpO2: 97% 96% 97% 98%  Weight:      Height:        Intake/Output Summary (Last 24 hours) at 03/12/17 1333 Last data filed at 03/12/17 0108  Gross per 24 hour  Intake                0 ml  Output              950 ml  Net             -950 ml   Filed Weights   03/10/17 1928  Weight: 60.6 kg (133 lb 9.6 oz)    Examination:  General exam: Appears calm and comfortable Respiratory system: Clear to auscultation. Respiratory effort normal. Cardiovascular system: S1 & S2 heard, RRR. No murmurs. Gastrointestinal system: Abdomen is nondistended, soft and nontender. Normal bowel sounds heard. Central nervous system: Alert and oriented. No focal neurological deficits. Extremities: No edema. No calf tenderness Skin: No cyanosis. No rashes Psychiatry: Judgement and insight appear normal. Mood & affect appropriate.     Data Reviewed: I have personally reviewed following labs and imaging studies  CBC:  Recent Labs Lab 03/10/17 0940 03/10/17 2003 03/11/17 0210 03/12/17 0641  WBC 12.6* 13.0* 11.0* 11.7*  NEUTROABS  --  9.5* 6.9  --   HGB 13.8 13.3 12.1 12.3  HCT 41.2 39.2 35.8* 36.6  MCV 88.6 87.5 87.5 87.8  PLT 357 PLATELET CLUMPS NOTED ON SMEAR, UNABLE TO ESTIMATE 369 384   Basic Metabolic Panel:  Recent Labs Lab 03/10/17 0940 03/10/17 2003 03/11/17 0210 03/12/17 0641  NA 136 136 137 137  K 3.6 4.0 3.5 3.9  CL 101 102 104 101  CO2 26 26 26 27   GLUCOSE 127* 109* 108* 116*  BUN 8 7 8 7   CREATININE 0.59 0.52 0.45 0.52  CALCIUM 9.2 9.1 8.7* 9.1   GFR: Estimated Creatinine Clearance: 51.8 mL/min (by C-G formula based on SCr of 0.52 mg/dL). Liver Function Tests:  Recent Labs Lab 03/10/17 2003 03/11/17 0210 03/12/17 0641  AST 28 22 20   ALT 25 23 18   ALKPHOS 88 78 75  BILITOT 0.9 0.4 0.5  PROT 6.4* 6.2* 6.3*  ALBUMIN 3.6 3.2* 3.0*   No results for input(s): LIPASE, AMYLASE in the last 168 hours. No results for input(s): AMMONIA in the last 168  hours. Coagulation Profile:  Recent Labs Lab 03/10/17 2003  INR 1.05   Cardiac Enzymes:  Recent Labs Lab 03/10/17 2003 03/11/17 0210 03/11/17 0735  TROPONINI <0.03 <0.03 <0.03   BNP (last 3 results) No results for input(s): PROBNP in the last 8760 hours. HbA1C: No results for input(s): HGBA1C in the last 72 hours. CBG: No results for input(s): GLUCAP in the last 168 hours. Lipid Profile: No results for input(s): CHOL, HDL, LDLCALC, TRIG, CHOLHDL, LDLDIRECT in the last 72 hours. Thyroid Function Tests:  Recent Labs  03/10/17 2003  TSH 0.481   Anemia Panel: No results for input(s): VITAMINB12, FOLATE, FERRITIN, TIBC, IRON, RETICCTPCT in the last 72 hours. Sepsis Labs:  Recent Labs Lab 03/10/17 0949  LATICACIDVEN 0.89    Recent Results (from the past 240 hour(s))  Culture, Urine     Status: Abnormal   Collection Time: 03/10/17  4:38 PM  Result Value Ref Range Status   Specimen Description URINE, CLEAN CATCH  Final   Special Requests NONE  Final   Culture MULTIPLE SPECIES PRESENT, SUGGEST RECOLLECTION (A)  Final   Report Status 03/11/2017 FINAL  Final  Culture, blood (routine x 2)     Status: None (Preliminary result)   Collection Time: 03/10/17  8:09 PM  Result Value Ref Range Status   Specimen Description BLOOD RIGHT ANTECUBITAL  Final   Special Requests   Final    BOTTLES DRAWN AEROBIC ONLY Blood Culture results may not be optimal due to an inadequate volume of blood received in culture bottles   Culture NO GROWTH < 12 HOURS  Final   Report Status PENDING  Incomplete  Culture, blood (routine x 2)     Status: None (Preliminary result)   Collection Time: 03/10/17  8:09 PM  Result Value Ref Range Status   Specimen Description BLOOD RIGHT HAND  Final   Special Requests   Final    BOTTLES DRAWN AEROBIC ONLY Blood Culture results may not be optimal due to an inadequate volume of blood received in culture bottles   Culture PENDING  Incomplete   Report Status  PENDING  Incomplete  CSF culture     Status: None (Preliminary result)   Collection Time: 03/11/17  2:04 PM  Result Value Ref Range Status   Specimen Description CSF  Final   Special Requests NONE  Final   Gram Stain CYTOSPIN SMEAR NO WBC SEEN NO ORGANISMS SEEN   Final   Culture PENDING  Incomplete   Report Status PENDING  Incomplete         Radiology Studies: Ct Head Wo Contrast  Result Date: 03/10/2017 CLINICAL DATA:  71 year old female with 3 days of neck pain. Difficulty swallowing. Recent lumbar surgery. EXAM: CT HEAD WITHOUT CONTRAST CT ANGIOGRAPHY NECK TECHNIQUE: Multidetector CT imaging of the head without contrast and CTA neck was performed  using bolus administration of intravenous contrast for the neck portion. Multiplanar CT image reconstructions and MIPs were obtained to evaluate the vascular anatomy. Carotid stenosis measurements (when applicable) are obtained utilizing NASCET criteria, using the distal internal carotid diameter as the denominator. CONTRAST:  50 mL Isovue 370 COMPARISON:  Cervical spine MRI 12/11/2010 FINDINGS: CT HEAD Brain: No midline shift, ventriculomegaly, mass effect, evidence of mass lesion, intracranial hemorrhage or evidence of cortically based acute infarction. Gray-white matter differentiation is within normal limits throughout the brain. Calvarium and skull base: Negative. No acute osseous abnormality identified. Paranasal sinuses: Clear. Orbits: No acute orbit or scalp soft tissue findings. CTA NECK Skeleton: Visualized skull base is intact. No atlanto-occipital dissociation. Mild degenerative appearing anterolisthesis of C3 on C4 is chronic. Straightening of cervical lordosis. Bilateral posterior element alignment is within normal limits. Chronic disc and endplate degeneration at C5-C6 with mild spinal stenosis. No acute osseous abnormality identified. Upper chest: Negative.  No superior mediastinal lymphadenopathy. Other neck: Thyroid, larynx,  pharynx, parapharyngeal spaces, sublingual space, right submandibular gland and parotid glands are within normal limits. The left submandibular gland appears atrophied or surgically absent. Partially retropharyngeal course of the right carotid, otherwise negative retropharyngeal space. No cervical lymphadenopathy. Aortic arch: Negative.  Three vessel arch configuration. Right carotid system: Negative aside from a partially retropharyngeal course. Visible right ICA siphon is patent with minor calcified atherosclerosis. Visible right anterior circulation appears normal. A fetal type right PCA origin is suspected. Left carotid system: Negative aside from mild tortuosity. Visible left ICA siphon is patent with mild calcified plaque. Visible left anterior circulation is within normal limits; non dominant left A1 segment and fetal type left PCA origins are suspected. Vertebral arteries: No proximal subclavian artery atherosclerosis or stenosis. Tortuous but otherwise negative right vertebral artery. Negative left vertebral artery. The vertebral arteries are fairly codominant. PICA origins are patent. The distal vertebral and basilar arteries appear diminutive, which appears to beyond the basis of fetal type PCA origins. SCA origins are patent. Review of the MIP images confirms the above findings IMPRESSION: 1.  Normal for age non contrast CT appearance of the brain. 2. Negative CTA neck aside from carotid and vertebral artery tortuosity. 3. No acute osseous abnormality identified. Cervical spine degeneration appears not significantly changed since 2012. 4. 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 Electronically Signed   By: Odessa Fleming M.D.   On: 03/10/2017 15:16   Ct Angio Neck W And/or Wo Contrast  Result Date: 03/10/2017 CLINICAL DATA:  71 year old female with 3 days of neck pain. Difficulty swallowing. Recent lumbar surgery. EXAM: CT HEAD WITHOUT  CONTRAST CT ANGIOGRAPHY NECK TECHNIQUE: Multidetector CT imaging of the head without contrast and CTA neck was performed using bolus administration of intravenous contrast for the neck portion. Multiplanar CT image reconstructions and MIPs were obtained to evaluate the vascular anatomy. Carotid stenosis measurements (when applicable) are obtained utilizing NASCET criteria, using the distal internal carotid diameter as the denominator. CONTRAST:  50 mL Isovue 370 COMPARISON:  Cervical spine MRI 12/11/2010 FINDINGS: CT HEAD Brain: No midline shift, ventriculomegaly, mass effect, evidence of mass lesion, intracranial hemorrhage or evidence of cortically based acute infarction. Gray-white matter differentiation is within normal limits throughout the brain. Calvarium and skull base: Negative. No acute osseous abnormality identified. Paranasal sinuses: Clear. Orbits: No acute orbit or scalp soft tissue findings. CTA NECK Skeleton: Visualized skull base is intact. No atlanto-occipital dissociation. Mild degenerative appearing anterolisthesis of C3 on C4  is chronic. Straightening of cervical lordosis. Bilateral posterior element alignment is within normal limits. Chronic disc and endplate degeneration at C5-C6 with mild spinal stenosis. No acute osseous abnormality identified. Upper chest: Negative.  No superior mediastinal lymphadenopathy. Other neck: Thyroid, larynx, pharynx, parapharyngeal spaces, sublingual space, right submandibular gland and parotid glands are within normal limits. The left submandibular gland appears atrophied or surgically absent. Partially retropharyngeal course of the right carotid, otherwise negative retropharyngeal space. No cervical lymphadenopathy. Aortic arch: Negative.  Three vessel arch configuration. Right carotid system: Negative aside from a partially retropharyngeal course. Visible right ICA siphon is patent with minor calcified atherosclerosis. Visible right anterior circulation  appears normal. A fetal type right PCA origin is suspected. Left carotid system: Negative aside from mild tortuosity. Visible left ICA siphon is patent with mild calcified plaque. Visible left anterior circulation is within normal limits; non dominant left A1 segment and fetal type left PCA origins are suspected. Vertebral arteries: No proximal subclavian artery atherosclerosis or stenosis. Tortuous but otherwise negative right vertebral artery. Negative left vertebral artery. The vertebral arteries are fairly codominant. PICA origins are patent. The distal vertebral and basilar arteries appear diminutive, which appears to beyond the basis of fetal type PCA origins. SCA origins are patent. Review of the MIP images confirms the above findings IMPRESSION: 1.  Normal for age non contrast CT appearance of the brain. 2. Negative CTA neck aside from carotid and vertebral artery tortuosity. 3. No acute osseous abnormality identified. Cervical spine degeneration appears not significantly changed since 2012. 4. 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 Electronically Signed   By: Odessa Fleming M.D.   On: 03/10/2017 15:16   Mr Laqueta Jean WU Contrast  Result Date: 03/10/2017 CLINICAL DATA:  Neck pain and headache for 1 week after lumbar spine surgery. Difficulty swallowing, photophobia. Evaluate intractable headache. EXAM: MRI HEAD WITHOUT AND WITH CONTRAST MRI CERVICAL SPINE WITHOUT CONTRAST TECHNIQUE: Multiplanar, multiecho pulse sequences of the brain and surrounding structures were obtained without and with intravenous contrast. Multiplanar multi echo pulse sequences of the cervical spine, to include the craniocervical junction and cervicothoracic junction, were obtained without intravenous contrast. CONTRAST:  10mL MULTIHANCE GADOBENATE DIMEGLUMINE 529 MG/ML IV SOLN COMPARISON:  CT HEAD and CTA HEAD and neck Mar 10, 2017 FINDINGS: MRI HEAD FINDINGS INTRACRANIAL  CONTENTS: No reduced diffusion to suggest acute ischemia. No susceptibility artifact to suggest hemorrhage. The ventricles and sulci are normal for patient's age. No suspicious parenchymal signal, masses, mass effect. No abnormal intraparenchymal or extra-axial enhancement. Perivascular space RIGHT thalamus. No abnormal extra-axial fluid collections. Mildly prominent extra-axial spaces at the convexity, likely normal variant without corroborative findings of intracranial hypotension such as sagging midbrain or dural enhancement. No extra-axial masses. VASCULAR: Normal major intracranial vascular flow voids present at skull base. SKULL AND UPPER CERVICAL SPINE: No abnormal sellar expansion. No suspicious calvarial bone marrow signal. Craniocervical junction maintained. SINUSES/ORBITS: The mastoid air-cells and included paranasal sinuses are well-aerated.The included ocular globes and orbital contents are non-suspicious. Status post bilateral ocular lens implants. OTHER: None. MRI CERVICAL SPINE FINDINGS ALIGNMENT: Maintained cervical lordosis.  No malalignment. VERTEBRAE/DISCS: Vertebral bodies are intact. Moderate to severe C5-6 disc height loss, moderate C4-5 and mild at C6-7 with proportional chronic discogenic endplate changes. Disc desiccation all cervical levels. Mild periarticular bright STIR signal LEFT C3-4 facets. CORD:Cervical spinal cord is normal morphology and signal characteristics from the cervicomedullary junction to level of T3-4, the most caudal well visualized level.  POSTERIOR FOSSA, VERTEBRAL ARTERIES, PARASPINAL TISSUES: No MR findings of ligamentous injury. Vertebral artery flow voids present. Included posterior fossa and paraspinal soft tissues are normal. DISC LEVELS: C2-3: No disc bulge, canal stenosis nor neural foraminal narrowing. C3-4: Annular bulging, uncovertebral hypertrophy and tiny central disc protrusion. Mild facet arthropathy. No canal stenosis or neural foraminal narrowing.  C4-5: Small broad-based disc bulge, uncovertebral hypertrophy and mild facet arthropathy. No canal stenosis. Mild RIGHT neural foraminal narrowing. C5-6: Small broad-based disc bulge, uncovertebral hypertrophy and mild facet arthropathy. Mild canal stenosis. Mild RIGHT, moderate LEFT neural foraminal narrowing. C6-7: Small broad-based disc bulge. Uncovertebral hypertrophy. No canal stenosis. Mild LEFT neural foraminal narrowing. C7-T1: No disc bulge, canal stenosis nor neural foraminal narrowing. IMPRESSION: MRI head: Negative MRI of the head for age with and without contrast. MRI cervical spine: Degenerative cervical spine resulting in mild canal stenosis at C5-6. Neural foraminal narrowing C4-5 through C6-7: Moderate on the LEFT at C5-6. LEFT C3-4 facet arthropathy and mild acute reactive changes. Electronically Signed   By: Awilda Metro M.D.   On: 03/10/2017 18:53   Mr Cervical Spine Wo Contrast  Result Date: 03/10/2017 CLINICAL DATA:  Neck pain and headache for 1 week after lumbar spine surgery. Difficulty swallowing, photophobia. Evaluate intractable headache. EXAM: MRI HEAD WITHOUT AND WITH CONTRAST MRI CERVICAL SPINE WITHOUT CONTRAST TECHNIQUE: Multiplanar, multiecho pulse sequences of the brain and surrounding structures were obtained without and with intravenous contrast. Multiplanar multi echo pulse sequences of the cervical spine, to include the craniocervical junction and cervicothoracic junction, were obtained without intravenous contrast. CONTRAST:  10mL MULTIHANCE GADOBENATE DIMEGLUMINE 529 MG/ML IV SOLN COMPARISON:  CT HEAD and CTA HEAD and neck Mar 10, 2017 FINDINGS: MRI HEAD FINDINGS INTRACRANIAL CONTENTS: No reduced diffusion to suggest acute ischemia. No susceptibility artifact to suggest hemorrhage. The ventricles and sulci are normal for patient's age. No suspicious parenchymal signal, masses, mass effect. No abnormal intraparenchymal or extra-axial enhancement. Perivascular space  RIGHT thalamus. No abnormal extra-axial fluid collections. Mildly prominent extra-axial spaces at the convexity, likely normal variant without corroborative findings of intracranial hypotension such as sagging midbrain or dural enhancement. No extra-axial masses. VASCULAR: Normal major intracranial vascular flow voids present at skull base. SKULL AND UPPER CERVICAL SPINE: No abnormal sellar expansion. No suspicious calvarial bone marrow signal. Craniocervical junction maintained. SINUSES/ORBITS: The mastoid air-cells and included paranasal sinuses are well-aerated.The included ocular globes and orbital contents are non-suspicious. Status post bilateral ocular lens implants. OTHER: None. MRI CERVICAL SPINE FINDINGS ALIGNMENT: Maintained cervical lordosis.  No malalignment. VERTEBRAE/DISCS: Vertebral bodies are intact. Moderate to severe C5-6 disc height loss, moderate C4-5 and mild at C6-7 with proportional chronic discogenic endplate changes. Disc desiccation all cervical levels. Mild periarticular bright STIR signal LEFT C3-4 facets. CORD:Cervical spinal cord is normal morphology and signal characteristics from the cervicomedullary junction to level of T3-4, the most caudal well visualized level. POSTERIOR FOSSA, VERTEBRAL ARTERIES, PARASPINAL TISSUES: No MR findings of ligamentous injury. Vertebral artery flow voids present. Included posterior fossa and paraspinal soft tissues are normal. DISC LEVELS: C2-3: No disc bulge, canal stenosis nor neural foraminal narrowing. C3-4: Annular bulging, uncovertebral hypertrophy and tiny central disc protrusion. Mild facet arthropathy. No canal stenosis or neural foraminal narrowing. C4-5: Small broad-based disc bulge, uncovertebral hypertrophy and mild facet arthropathy. No canal stenosis. Mild RIGHT neural foraminal narrowing. C5-6: Small broad-based disc bulge, uncovertebral hypertrophy and mild facet arthropathy. Mild canal stenosis. Mild RIGHT, moderate LEFT neural  foraminal narrowing. C6-7: Small broad-based disc bulge. Uncovertebral hypertrophy.  No canal stenosis. Mild LEFT neural foraminal narrowing. C7-T1: No disc bulge, canal stenosis nor neural foraminal narrowing. IMPRESSION: MRI head: Negative MRI of the head for age with and without contrast. MRI cervical spine: Degenerative cervical spine resulting in mild canal stenosis at C5-6. Neural foraminal narrowing C4-5 through C6-7: Moderate on the LEFT at C5-6. LEFT C3-4 facet arthropathy and mild acute reactive changes. Electronically Signed   By: Awilda Metro M.D.   On: 03/10/2017 18:53   Dg Chest Port 1 View  Result Date: 03/11/2017 CLINICAL DATA:  Elevated white blood cell count. EXAM: PORTABLE CHEST 1 VIEW COMPARISON:  PACs FINDINGS: The lungs are reasonably well inflated. The right hemidiaphragm is higher than the left. There is no alveolar infiltrate or pleural effusion. The heart and pulmonary vascularity are normal. The mediastinum is normal in width. The trachea is midline. The observed bony thorax is unremarkable. IMPRESSION: There is no pneumonia nor other acute cardiopulmonary abnormality. Electronically Signed   By: David  [place] M.D.   On: 03/11/2017 16:50   Dg Fluoro Guide Lumbar Puncture  Result Date: 03/11/2017 Florencia Reasons, MD     03/11/2017  1:50 PM Fluoroscopic guided lumbar puncture performed at L2-L3.  Opening pressure 21 mmH20.  14 mL clear CSF obtained and sent to lab per orders of primary medical team.  See full dictation in PACS for further details.        Scheduled Meds: . acidophilus  1 capsule Oral Daily  . baclofen  5 mg Oral TID  . BIEST/PROGESTERONE  1 application Transdermal Daily  . docusate sodium  200 mg Oral BID  . gabapentin  400 mg Oral TID  . latanoprost  1 drop Both Eyes QHS  . multivitamin with minerals  1 tablet Oral Daily  . oxyCODONE  20 mg Oral Q12H  . pantoprazole  40 mg Oral Q0600  . polyethylene glycol  17 g Oral BID  . progesterone  100  mg Oral QHS  . thyroid  60 mg Oral QAC breakfast   Continuous Infusions:   LOS: 1 day     Jacquelin Hawking, MD [facility] 03/12/2017, 1:33 PM Pager: [phone]  If 7PM-7AM, please contact night-coverage [email or web address] Password [email or web address] 03/12/2017, 1:33 PM

## 2017-03-12 NOTE — Progress Notes (Signed)
Physical Therapy Treatment Patient Details Name: Ann Bell MRN: [id number] DOB: [birth year] Today's Date: 03/12/2017    History of Present Illness Pt is a 71 yo female who comes in with intractable HA. She recently underwent lumbar fusion surgery. The neck pain radiates to the posterior head and also reports some difficulty swallowing. She reports photophobia and barely able to open eyes due to light sensitivity. CSF was unremarkable. PMH significant for chronic back pain, glaucoma, headache, history of blood transfusion, history of colon polyps, hypothyroidism, perforated ulcer, peripheral neuropathy, and pneumonia.    PT Comments    Pt seen for mobility progression. She continues to present with modest instability with ambulation and cognitive deficits potentially related to medications. Pt would continue to benefit from skilled physical therapy services at this time while admitted and after d/c to address the below listed limitations in order to improve overall safety and independence with functional mobility.   Follow Up Recommendations  No PT follow up     Equipment Recommendations  None recommended by PT    Recommendations for Other Services       Precautions / Restrictions Precautions Precautions: Back Precaution Booklet Issued: No Precaution Comments: reviewed 3/3 back precautions with pt and pt's spouse Required Braces or Orthoses: Spinal Brace Spinal Brace: Applied in sitting position Restrictions Weight Bearing Restrictions: No    Mobility  Bed Mobility Overal bed mobility: Needs Assistance Bed Mobility: Rolling;Sidelying to Sit Rolling: Supervision Sidelying to sit: Supervision       General bed mobility comments: VC's for log roll technique and for safety.   Transfers Overall transfer level: Needs assistance Equipment used: 1 person hand held assist Transfers: Sit to/from Stand Sit to Stand: Min guard         General transfer comment: increased  time, close min guard for stability with rise into standing from bed  Ambulation/Gait Ambulation/Gait assistance: Min assist Ambulation Distance (Feet): 75 Feet Assistive device: 1 person hand held assist Gait Pattern/deviations: Step-through pattern;Decreased stride length Gait velocity: decreased Gait velocity interpretation: Below normal speed for age/gender General Gait Details: modest instability but no overt LOB, min A for safety and stability with one HHA   Stairs            Wheelchair Mobility    Modified Rankin (Stroke Patients Only)       Balance Overall balance assessment: Needs assistance Sitting-balance support: No upper extremity supported;Feet supported Sitting balance-Leahy Scale: Good     Standing balance support: No upper extremity supported;During functional activity;Single extremity supported Standing balance-Leahy Scale: Fair                              Cognition Arousal/Alertness: Awake/alert Behavior During Therapy: WFL for tasks assessed/performed Overall Cognitive Status: Impaired/Different from baseline Area of Impairment: Attention;Memory;Safety/judgement;Problem solving                   Current Attention Level: Selective Memory: Decreased recall of precautions;Decreased short-term memory   Safety/Judgement: Decreased awareness of safety;Decreased awareness of deficits   Problem Solving: Slow processing;Requires verbal cues        Exercises      General Comments        Pertinent Vitals/Pain Pain Assessment: 0-10 Pain Score: 10-Worst pain ever Pain Location: neck Pain Descriptors / Indicators: Sore;Guarding Pain Intervention(s): Monitored during session;Repositioned    Home Living  Prior Function            PT Goals (current goals can now be found in the care plan section) Acute Rehab PT Goals PT Goal Formulation: With patient Time For Goal Achievement:  03/25/17 Potential to Achieve Goals: Good Progress towards PT goals: Progressing toward goals    Frequency    Min 3X/week      PT Plan Current plan remains appropriate    Co-evaluation              AM-PAC PT "6 Clicks" Daily Activity  Outcome Measure  Difficulty turning over in bed (including adjusting bedclothes, sheets and blankets)?: A Little Difficulty moving from lying on back to sitting on the side of the bed? : A Little Difficulty sitting down on and standing up from a chair with arms (e.g., wheelchair, bedside commode, etc,.)?: A Little Help needed moving to and from a bed to chair (including a wheelchair)?: A Little Help needed walking in hospital room?: A Little Help needed climbing 3-5 steps with a railing? : A Little 6 Click Score: 18    End of Session Equipment Utilized During Treatment: Gait belt;Back brace Activity Tolerance: Patient tolerated treatment well Patient left: in chair;with call bell/phone within reach;with family/visitor present Nurse Communication: Mobility status PT Visit Diagnosis: Other abnormalities of gait and mobility (R26.89);Pain Pain - part of body:  (back)     Time: 3244-0102 PT Time Calculation (min) (ACUTE ONLY): 23 min  Charges:  $Gait Training: 8-22 mins $Therapeutic Activity: 8-22 mins                    G Codes:       [place], [place], [place] [phone]    Alessandra Bevels Kron Everton 03/12/2017, 3:51 PM

## 2017-03-12 NOTE — Progress Notes (Signed)
CSF unremarkable It seems that her symptoms are likely due to cervical muscle spasm/strain She has some bowel and bladder issues likely related to opioids; I would recommend tapering these quickly No active neurosurgical issues Will sign off Call with questions

## 2017-03-12 NOTE — Progress Notes (Signed)
Subjective: Still having neck discomfort with movement but LP was obtained showing no meningitis.   Objective: Current vital signs: BP (!) 139/56 (BP Location: Right Arm)   Pulse 72   Temp 99.3 F (37.4 C) (Oral)   Resp 18   Ht 5' 3"  (1.6 m)   Wt 60.6 kg (133 lb 9.6 oz)   SpO2 97%   BMI 23.67 kg/m  Vital signs in last 24 hours: Temp:  [97 F (36.1 C)-99.3 F (37.4 C)] 99.3 F (37.4 C) (05/16 0647) Pulse Rate:  [72-81] 72 (05/16 0647) Resp:  [18] 18 (05/16 0647) BP: (125-139)/(53-62) 139/56 (05/16 0647) SpO2:  [95 %-99 %] 97 % (05/16 0647)  Intake/Output from previous day: 05/15 0701 - 05/16 0700 In: -  Out: 950 [Urine:950] Intake/Output this shift: No intake/output data recorded. Nutritional status: Diet regular Room service appropriate? Yes; Fluid consistency: Thin  ROS:                                                                                                                                       History obtained from the patient  General ROS: negative for - chills, fatigue, fever, night sweats, weight gain or weight loss Psychological ROS: negative for - behavioral disorder, hallucinations, memory difficulties, mood swings or suicidal ideation Ophthalmic ROS: negative for - blurry vision, double vision, eye pain or loss of vision ENT ROS: negative for - epistaxis, nasal discharge, oral lesions, sore throat, tinnitus or vertigo Allergy and Immunology ROS: negative for - hives or itchy/watery eyes Hematological and Lymphatic ROS: negative for - bleeding problems, bruising or swollen lymph nodes Endocrine ROS: negative for - galactorrhea, hair pattern changes, polydipsia/polyuria or temperature intolerance Respiratory ROS: negative for - cough, hemoptysis, shortness of breath or wheezing Cardiovascular ROS: negative for - chest pain, dyspnea on exertion, edema or irregular heartbeat Gastrointestinal ROS: negative for - abdominal pain, diarrhea, hematemesis,  nausea/vomiting or stool incontinence Genito-Urinary ROS: negative for - dysuria, hematuria, incontinence or urinary frequency/urgency Musculoskeletal ROS: negative for - joint swelling or muscular weakness Neurological ROS: as noted in HPI Dermatological ROS: negative for rash and skin lesion changes    Neurologic Exam: General: NAD Mental Status: Alert, oriented, thought content appropriate.  Speech fluent without evidence of aphasia.  Able to follow 3 step commands without difficulty. Cranial Nerves: II:  Visual fields grossly normal, pupils equal, round, reactive to light and accommodation III,IV, VI: ptosis not present, extra-ocular motions intact bilaterally V,VII: smile symmetric, facial light touch sensation normal bilaterally VIII: hearing normal bilaterally IX,X: uvula rises symmetrically XI: bilateral shoulder shrug XII: midline tongue extension without atrophy or fasciculations  Motor: Right : Upper extremity   5/5    Left:     Upper extremity   5/5  Lower extremity   5/5     Lower extremity   5/5  -----Patient's neck is supple with head turning to the left and  right for approximately 30 however at that point she has significant pain and resists movement. She is able to move her neck from stable position to touch her chin but this does cause pain also. I did not notice any muscular induration however she is very sensitive to palpation in the posterior aspect of her neck. Negative Lhermitte sign, negative Hoffmann's--no using cool packs.    Tone and bulk:normal tone throughout; no atrophy noted Sensory: Pinprick and light touch intact throughout, bilaterally Deep Tendon Reflexes:  Right: Upper Extremity   Left: Upper extremity   biceps (C-5 to C-6) 2/4   biceps (C-5 to C-6) 2/4 tricep (C7) 2/4    triceps (C7) 2/4 Brachioradialis (C6) 2/4  Brachioradialis (C6) 2/4  Lower Extremity Lower Extremity  quadriceps (L-2 to L-4) 2/4   quadriceps (L-2 to L-4) 2/4 Achilles  (S1) 2/4   Achilles (S1) 2/4    Lab Results: Basic Metabolic Panel:  Recent Labs Lab 03/10/17 0940 03/10/17 2003 03/11/17 0210 03/12/17 0641  NA 136 136 137 137  K 3.6 4.0 3.5 3.9  CL 101 102 104 101  CO2 26 26 26 27   GLUCOSE 127* 109* 108* 116*  BUN 8 7 8 7   CREATININE 0.59 0.52 0.45 0.52  CALCIUM 9.2 9.1 8.7* 9.1    Liver Function Tests:  Recent Labs Lab 03/10/17 2003 03/11/17 0210 03/12/17 0641  AST 28 22 20   ALT 25 23 18   ALKPHOS 88 78 75  BILITOT 0.9 0.4 0.5  PROT 6.4* 6.2* 6.3*  ALBUMIN 3.6 3.2* 3.0*   No results for input(s): LIPASE, AMYLASE in the last 168 hours. No results for input(s): AMMONIA in the last 168 hours.  CBC:  Recent Labs Lab 03/10/17 0940 03/10/17 2003 03/11/17 0210 03/12/17 0641  WBC 12.6* 13.0* 11.0* 11.7*  NEUTROABS  --  9.5* 6.9  --   HGB 13.8 13.3 12.1 12.3  HCT 41.2 39.2 35.8* 36.6  MCV 88.6 87.5 87.5 87.8  PLT 357 PLATELET CLUMPS NOTED ON SMEAR, UNABLE TO ESTIMATE 369 384    Pertinent labs: CSF wbc's 1 CSF RBCs 9 CSF protein 32 CSF glucose 75 CSF Gram stain negative CSF culture pending CSF VDRL pending  CBC notable for white blood cell count 11.0, down from 13.0 on 03/10/17 CMP notable for glucose 108, total protein 6.2, albumin 3.2 CRP elevated at 11.2 ESR 42 Blood cultures pending 2 TSH 0.481 HIV nonreactive Urinalysis notable for trace leukocyte esterase, negative nitrites   Cardiac Enzymes:  Recent Labs Lab 03/10/17 2003 03/11/17 0210 03/11/17 0735  TROPONINI <0.03 <0.03 <0.03    Lipid Panel: No results for input(s): CHOL, TRIG, HDL, CHOLHDL, VLDL, LDLCALC in the last 168 hours.  CBG: No results for input(s): GLUCAP in the last 168 hours.  Microbiology: Results for orders placed or performed during the hospital encounter of 03/10/17  Culture, Urine     Status: Abnormal   Collection Time: 03/10/17  4:38 PM  Result Value Ref Range Status   Specimen Description URINE, CLEAN CATCH  Final    Special Requests NONE  Final   Culture MULTIPLE SPECIES PRESENT, SUGGEST RECOLLECTION (A)  Final   Report Status 03/11/2017 FINAL  Final  Culture, blood (routine x 2)     Status: None (Preliminary result)   Collection Time: 03/10/17  8:09 PM  Result Value Ref Range Status   Specimen Description BLOOD RIGHT ANTECUBITAL  Final   Special Requests   Final    BOTTLES DRAWN AEROBIC ONLY Blood Culture  results may not be optimal due to an inadequate volume of blood received in culture bottles   Culture NO GROWTH < 12 HOURS  Final   Report Status PENDING  Incomplete  Culture, blood (routine x 2)     Status: None (Preliminary result)   Collection Time: 03/10/17  8:09 PM  Result Value Ref Range Status   Specimen Description BLOOD RIGHT HAND  Final   Special Requests   Final    BOTTLES DRAWN AEROBIC ONLY Blood Culture results may not be optimal due to an inadequate volume of blood received in culture bottles   Culture PENDING  Incomplete   Report Status PENDING  Incomplete  CSF culture     Status: None (Preliminary result)   Collection Time: 03/11/17  2:04 PM  Result Value Ref Range Status   Specimen Description CSF  Final   Special Requests NONE  Final   Gram Stain CYTOSPIN SMEAR NO WBC SEEN NO ORGANISMS SEEN   Final   Culture PENDING  Incomplete   Report Status PENDING  Incomplete    Coagulation Studies:  Recent Labs  03/10/17 2003  LABPROT 13.7  INR 1.05    Imaging: Ct Head Wo Contrast  Result Date: 03/10/2017 CLINICAL DATA:  71 year old female with 3 days of neck pain. Difficulty swallowing. Recent lumbar surgery. EXAM: CT HEAD WITHOUT CONTRAST CT ANGIOGRAPHY NECK TECHNIQUE: Multidetector CT imaging of the head without contrast and CTA neck was performed using bolus administration of intravenous contrast for the neck portion. Multiplanar CT image reconstructions and MIPs were obtained to evaluate the vascular anatomy. Carotid stenosis measurements (when applicable) are obtained  utilizing NASCET criteria, using the distal internal carotid diameter as the denominator. CONTRAST:  50 mL Isovue 370 COMPARISON:  Cervical spine MRI 12/11/2010 FINDINGS: CT HEAD Brain: No midline shift, ventriculomegaly, mass effect, evidence of mass lesion, intracranial hemorrhage or evidence of cortically based acute infarction. Gray-white matter differentiation is within normal limits throughout the brain. Calvarium and skull base: Negative. No acute osseous abnormality identified. Paranasal sinuses: Clear. Orbits: No acute orbit or scalp soft tissue findings. CTA NECK Skeleton: Visualized skull base is intact. No atlanto-occipital dissociation. Mild degenerative appearing anterolisthesis of C3 on C4 is chronic. Straightening of cervical lordosis. Bilateral posterior element alignment is within normal limits. Chronic disc and endplate degeneration at C5-C6 with mild spinal stenosis. No acute osseous abnormality identified. Upper chest: Negative.  No superior mediastinal lymphadenopathy. Other neck: Thyroid, larynx, pharynx, parapharyngeal spaces, sublingual space, right submandibular gland and parotid glands are within normal limits. The left submandibular gland appears atrophied or surgically absent. Partially retropharyngeal course of the right carotid, otherwise negative retropharyngeal space. No cervical lymphadenopathy. Aortic arch: Negative.  Three vessel arch configuration. Right carotid system: Negative aside from a partially retropharyngeal course. Visible right ICA siphon is patent with minor calcified atherosclerosis. Visible right anterior circulation appears normal. A fetal type right PCA origin is suspected. Left carotid system: Negative aside from mild tortuosity. Visible left ICA siphon is patent with mild calcified plaque. Visible left anterior circulation is within normal limits; non dominant left A1 segment and fetal type left PCA origins are suspected. Vertebral arteries: No proximal  subclavian artery atherosclerosis or stenosis. Tortuous but otherwise negative right vertebral artery. Negative left vertebral artery. The vertebral arteries are fairly codominant. PICA origins are patent. The distal vertebral and basilar arteries appear diminutive, which appears to beyond the basis of fetal type PCA origins. SCA origins are patent. Review of the MIP images confirms the  above findings IMPRESSION: 1.  Normal for age non contrast CT appearance of the brain. 2. Negative CTA neck aside from carotid and vertebral artery tortuosity. 3. No acute osseous abnormality identified. Cervical spine degeneration appears not significantly changed since 2012. 4. 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 Electronically Signed   By: Genevie Ann M.D.   On: 03/10/2017 15:16   Ct Angio Neck W And/or Wo Contrast  Result Date: 03/10/2017 CLINICAL DATA:  71 year old female with 3 days of neck pain. Difficulty swallowing. Recent lumbar surgery. EXAM: CT HEAD WITHOUT CONTRAST CT ANGIOGRAPHY NECK TECHNIQUE: Multidetector CT imaging of the head without contrast and CTA neck was performed using bolus administration of intravenous contrast for the neck portion. Multiplanar CT image reconstructions and MIPs were obtained to evaluate the vascular anatomy. Carotid stenosis measurements (when applicable) are obtained utilizing NASCET criteria, using the distal internal carotid diameter as the denominator. CONTRAST:  50 mL Isovue 370 COMPARISON:  Cervical spine MRI 12/11/2010 FINDINGS: CT HEAD Brain: No midline shift, ventriculomegaly, mass effect, evidence of mass lesion, intracranial hemorrhage or evidence of cortically based acute infarction. Gray-white matter differentiation is within normal limits throughout the brain. Calvarium and skull base: Negative. No acute osseous abnormality identified. Paranasal sinuses: Clear. Orbits: No acute orbit or scalp soft tissue  findings. CTA NECK Skeleton: Visualized skull base is intact. No atlanto-occipital dissociation. Mild degenerative appearing anterolisthesis of C3 on C4 is chronic. Straightening of cervical lordosis. Bilateral posterior element alignment is within normal limits. Chronic disc and endplate degeneration at C5-C6 with mild spinal stenosis. No acute osseous abnormality identified. Upper chest: Negative.  No superior mediastinal lymphadenopathy. Other neck: Thyroid, larynx, pharynx, parapharyngeal spaces, sublingual space, right submandibular gland and parotid glands are within normal limits. The left submandibular gland appears atrophied or surgically absent. Partially retropharyngeal course of the right carotid, otherwise negative retropharyngeal space. No cervical lymphadenopathy. Aortic arch: Negative.  Three vessel arch configuration. Right carotid system: Negative aside from a partially retropharyngeal course. Visible right ICA siphon is patent with minor calcified atherosclerosis. Visible right anterior circulation appears normal. A fetal type right PCA origin is suspected. Left carotid system: Negative aside from mild tortuosity. Visible left ICA siphon is patent with mild calcified plaque. Visible left anterior circulation is within normal limits; non dominant left A1 segment and fetal type left PCA origins are suspected. Vertebral arteries: No proximal subclavian artery atherosclerosis or stenosis. Tortuous but otherwise negative right vertebral artery. Negative left vertebral artery. The vertebral arteries are fairly codominant. PICA origins are patent. The distal vertebral and basilar arteries appear diminutive, which appears to beyond the basis of fetal type PCA origins. SCA origins are patent. Review of the MIP images confirms the above findings IMPRESSION: 1.  Normal for age non contrast CT appearance of the brain. 2. Negative CTA neck aside from carotid and vertebral artery tortuosity. 3. No acute osseous  abnormality identified. Cervical spine degeneration appears not significantly changed since 2012. 4. 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 Electronically Signed   By: Genevie Ann M.D.   On: 03/10/2017 15:16   Mr Jeri Cos QB Contrast  Result Date: 03/10/2017 CLINICAL DATA:  Neck pain and headache for 1 week after lumbar spine surgery. Difficulty swallowing, photophobia. Evaluate intractable headache. EXAM: MRI HEAD WITHOUT AND WITH CONTRAST MRI CERVICAL SPINE WITHOUT CONTRAST TECHNIQUE: Multiplanar, multiecho pulse sequences of the brain and surrounding structures were obtained without and  with intravenous contrast. Multiplanar multi echo pulse sequences of the cervical spine, to include the craniocervical junction and cervicothoracic junction, were obtained without intravenous contrast. CONTRAST:  26m MULTIHANCE GADOBENATE DIMEGLUMINE 529 MG/ML IV SOLN COMPARISON:  CT HEAD and CTA HEAD and neck Mar 10, 2017 FINDINGS: MRI HEAD FINDINGS INTRACRANIAL CONTENTS: No reduced diffusion to suggest acute ischemia. No susceptibility artifact to suggest hemorrhage. The ventricles and sulci are normal for patient's age. No suspicious parenchymal signal, masses, mass effect. No abnormal intraparenchymal or extra-axial enhancement. Perivascular space RIGHT thalamus. No abnormal extra-axial fluid collections. Mildly prominent extra-axial spaces at the convexity, likely normal variant without corroborative findings of intracranial hypotension such as sagging midbrain or dural enhancement. No extra-axial masses. VASCULAR: Normal major intracranial vascular flow voids present at skull base. SKULL AND UPPER CERVICAL SPINE: No abnormal sellar expansion. No suspicious calvarial bone marrow signal. Craniocervical junction maintained. SINUSES/ORBITS: The mastoid air-cells and included paranasal sinuses are well-aerated.The included ocular globes and orbital contents are  non-suspicious. Status post bilateral ocular lens implants. OTHER: None. MRI CERVICAL SPINE FINDINGS ALIGNMENT: Maintained cervical lordosis.  No malalignment. VERTEBRAE/DISCS: Vertebral bodies are intact. Moderate to severe C5-6 disc height loss, moderate C4-5 and mild at C6-7 with proportional chronic discogenic endplate changes. Disc desiccation all cervical levels. Mild periarticular bright STIR signal LEFT C3-4 facets. CORD:Cervical spinal cord is normal morphology and signal characteristics from the cervicomedullary junction to level of T3-4, the most caudal well visualized level. POSTERIOR FOSSA, VERTEBRAL ARTERIES, PARASPINAL TISSUES: No MR findings of ligamentous injury. Vertebral artery flow voids present. Included posterior fossa and paraspinal soft tissues are normal. DISC LEVELS: C2-3: No disc bulge, canal stenosis nor neural foraminal narrowing. C3-4: Annular bulging, uncovertebral hypertrophy and tiny central disc protrusion. Mild facet arthropathy. No canal stenosis or neural foraminal narrowing. C4-5: Small broad-based disc bulge, uncovertebral hypertrophy and mild facet arthropathy. No canal stenosis. Mild RIGHT neural foraminal narrowing. C5-6: Small broad-based disc bulge, uncovertebral hypertrophy and mild facet arthropathy. Mild canal stenosis. Mild RIGHT, moderate LEFT neural foraminal narrowing. C6-7: Small broad-based disc bulge. Uncovertebral hypertrophy. No canal stenosis. Mild LEFT neural foraminal narrowing. C7-T1: No disc bulge, canal stenosis nor neural foraminal narrowing. IMPRESSION: MRI head: Negative MRI of the head for age with and without contrast. MRI cervical spine: Degenerative cervical spine resulting in mild canal stenosis at C5-6. Neural foraminal narrowing C4-5 through C6-7: Moderate on the LEFT at C5-6. LEFT C3-4 facet arthropathy and mild acute reactive changes. Electronically Signed   By: CElon AlasM.D.   On: 03/10/2017 18:53   Mr Cervical Spine Wo  Contrast  Result Date: 03/10/2017 CLINICAL DATA:  Neck pain and headache for 1 week after lumbar spine surgery. Difficulty swallowing, photophobia. Evaluate intractable headache. EXAM: MRI HEAD WITHOUT AND WITH CONTRAST MRI CERVICAL SPINE WITHOUT CONTRAST TECHNIQUE: Multiplanar, multiecho pulse sequences of the brain and surrounding structures were obtained without and with intravenous contrast. Multiplanar multi echo pulse sequences of the cervical spine, to include the craniocervical junction and cervicothoracic junction, were obtained without intravenous contrast. CONTRAST:  116mMULTIHANCE GADOBENATE DIMEGLUMINE 529 MG/ML IV SOLN COMPARISON:  CT HEAD and CTA HEAD and neck Mar 10, 2017 FINDINGS: MRI HEAD FINDINGS INTRACRANIAL CONTENTS: No reduced diffusion to suggest acute ischemia. No susceptibility artifact to suggest hemorrhage. The ventricles and sulci are normal for patient's age. No suspicious parenchymal signal, masses, mass effect. No abnormal intraparenchymal or extra-axial enhancement. Perivascular space RIGHT thalamus. No abnormal extra-axial fluid collections. Mildly prominent extra-axial spaces at the convexity, likely normal  variant without corroborative findings of intracranial hypotension such as sagging midbrain or dural enhancement. No extra-axial masses. VASCULAR: Normal major intracranial vascular flow voids present at skull base. SKULL AND UPPER CERVICAL SPINE: No abnormal sellar expansion. No suspicious calvarial bone marrow signal. Craniocervical junction maintained. SINUSES/ORBITS: The mastoid air-cells and included paranasal sinuses are well-aerated.The included ocular globes and orbital contents are non-suspicious. Status post bilateral ocular lens implants. OTHER: None. MRI CERVICAL SPINE FINDINGS ALIGNMENT: Maintained cervical lordosis.  No malalignment. VERTEBRAE/DISCS: Vertebral bodies are intact. Moderate to severe C5-6 disc height loss, moderate C4-5 and mild at C6-7 with  proportional chronic discogenic endplate changes. Disc desiccation all cervical levels. Mild periarticular bright STIR signal LEFT C3-4 facets. CORD:Cervical spinal cord is normal morphology and signal characteristics from the cervicomedullary junction to level of T3-4, the most caudal well visualized level. POSTERIOR FOSSA, VERTEBRAL ARTERIES, PARASPINAL TISSUES: No MR findings of ligamentous injury. Vertebral artery flow voids present. Included posterior fossa and paraspinal soft tissues are normal. DISC LEVELS: C2-3: No disc bulge, canal stenosis nor neural foraminal narrowing. C3-4: Annular bulging, uncovertebral hypertrophy and tiny central disc protrusion. Mild facet arthropathy. No canal stenosis or neural foraminal narrowing. C4-5: Small broad-based disc bulge, uncovertebral hypertrophy and mild facet arthropathy. No canal stenosis. Mild RIGHT neural foraminal narrowing. C5-6: Small broad-based disc bulge, uncovertebral hypertrophy and mild facet arthropathy. Mild canal stenosis. Mild RIGHT, moderate LEFT neural foraminal narrowing. C6-7: Small broad-based disc bulge. Uncovertebral hypertrophy. No canal stenosis. Mild LEFT neural foraminal narrowing. C7-T1: No disc bulge, canal stenosis nor neural foraminal narrowing. IMPRESSION: MRI head: Negative MRI of the head for age with and without contrast. MRI cervical spine: Degenerative cervical spine resulting in mild canal stenosis at C5-6. Neural foraminal narrowing C4-5 through C6-7: Moderate on the LEFT at C5-6. LEFT C3-4 facet arthropathy and mild acute reactive changes. Electronically Signed   By: Elon Alas M.D.   On: 03/10/2017 18:53   Dg Chest Port 1 View  Result Date: 03/11/2017 CLINICAL DATA:  Elevated white blood cell count. EXAM: PORTABLE CHEST 1 VIEW COMPARISON:  PACs FINDINGS: The lungs are reasonably well inflated. The right hemidiaphragm is higher than the left. There is no alveolar infiltrate or pleural effusion. The heart and  pulmonary vascularity are normal. The mediastinum is normal in width. The trachea is midline. The observed bony thorax is unremarkable. IMPRESSION: There is no pneumonia nor other acute cardiopulmonary abnormality. Electronically Signed   By: Mekai Wilkinson  [place] M.D.   On: 03/11/2017 16:50   Dg Fluoro Guide Lumbar Puncture  Result Date: 03/11/2017 Etheleen Mayhew, MD     03/11/2017  1:50 PM Fluoroscopic guided lumbar puncture performed at L2-L3.  Opening pressure 21 mmH20.  14 mL clear CSF obtained and sent to lab per orders of primary medical team.  See full dictation in PACS for further details.    Medications:  Scheduled: . acidophilus  1 capsule Oral Daily  . BIEST/PROGESTERONE  1 application Transdermal Daily  . cyclobenzaprine  5 mg Oral TID  . docusate sodium  200 mg Oral BID  . gabapentin  400 mg Oral TID  . latanoprost  1 drop Both Eyes QHS  . multivitamin with minerals  1 tablet Oral Daily  . oxyCODONE  20 mg Oral Q12H  . pantoprazole  40 mg Oral Q0600  . polyethylene glycol  17 g Oral BID  . progesterone  100 mg Oral QHS  . thyroid  60 mg Oral QAC breakfast    Assessment/Plan: This 71 year old  female status post lumbar surgery. Now complaining of neck discomfort. LP was obtained and did not show any infection. Most likely her discomfort is musculoskeletal in nature. She is on Flexeril home however this doesn't seem to be giving much relief. May consider some other form of muscle relaxant such as baclofen. She's been using ice however he may be a better choice. Would agree with physical therapy and massage. At this time no further recommendations per neurology. Neurology will sign off.    Etta Quill PA-C [facility] [phone]  03/12/2017, 9:28 AM

## 2017-03-13 LAB — GLUCOSE, CAPILLARY: Glucose-Capillary: 157 mg/dL — ABNORMAL HIGH (ref 65–99)

## 2017-03-13 MED ORDER — NALOXONE HCL 0.4 MG/ML IJ SOLN: 0.4000 mg | Freq: Once | INTRAMUSCULAR | Status: DC

## 2017-03-13 MED ORDER — NALOXONE HCL 0.4 MG/ML IJ SOLN
INTRAMUSCULAR | Status: AC
  Filled 2017-03-13: qty 1

## 2017-03-13 MED ORDER — BISACODYL 10 MG RE SUPP
10.0000 mg | Freq: Once | RECTAL | Status: AC
  Administered 2017-03-13: 10 mg via RECTAL
  Filled 2017-03-13: qty 1

## 2017-03-13 MED ORDER — METHYLPREDNISOLONE SODIUM SUCC 125 MG IJ SOLR
60.0000 mg | Freq: Every day | INTRAMUSCULAR | Status: DC
  Administered 2017-03-13 – 2017-03-14 (×2): 60 mg via INTRAVENOUS
  Filled 2017-03-13 (×2): qty 2

## 2017-03-13 NOTE — Progress Notes (Signed)
Dr. Toniann Fail asked NP to go to bedside and evaluate pt due to RN reports of her being lethargic. NP to bedside.  S: Per husband, no narcotics since this am. No new meds except Prednisone. Husband states she has been just like she is now in the am today. States she was in a regular mood at dinner time. Husband states she doesn't know the month. Husband states she is irritable to him at home. Per husband, no hx of psych disorder. Per RN, she stated she "just wants to sleep" and becomes belligerent and physical when touched. Doesn't cooperate with exam by RN.  O: Well appearing WF with eyes closed. VSS. She opened her eyes briefly with tactile stimulation. Only cooperative for grip testing. Refusing to cooperate with rest of neuro exam. Her speech is normal "stop messing with me" and "quit". She grits her teeth when touched and hit this NP. Card: RRR. Resp: CTA on anterior exam. Normal respiratory effort and rate. On RA and satting normally. MOE x 4 with tactile stimulation/pain.  A/P: 1. Irritability-waxing and waning and also evident at home (?). NP doesn't see anything obvious wrong with pt. The neuro exam NP managed to do is completely normal. Exam not consistent with stroke. She had a CTA/CT head during this stay which was normal. Was going to give Narcan a try, but she jerked her arm away from nurse and tried to hit her. NP doesn't feel she is lethargic at all, she is just uncooperative and belligerent. NP cancelled labs and called Dr. Toniann Fail with findings. She just started Prednisone which can cause some irritability, but husband stated she acts this way towards him at home. ? Psych disorder. Labs cancelled. After staff left the room, pt was talking to her husband and said "why are they being so mean to me?".  Will watch.  KJKG, NP [facility]

## 2017-03-13 NOTE — Progress Notes (Signed)
Occupational Therapy Re-Evaluation Patient Details Name: Ann Ann [name] MRN: [id number] DOB: Apr 29, 1946 Today's Date: 03/13/2017    History of Present Illness Pt is Ann [name] 71 yo female who comes in with intractable HA. She recently underwent lumbar fusion surgery. The neck pain radiates to the posterior head and also reports some difficulty swallowing. She reports photophobia and barely able to open eyes due to light sensitivity. CSF was unremarkable. PMH significant for chronic back pain, glaucoma, headache, history of blood transfusion, history of colon polyps, hypothyroidism, perforated ulcer, peripheral neuropathy, and pneumonia.   Clinical Impression   Pt presenting to OT with significantly decreased cognitive and functional status this afternoon. She requires overall max assist with ADL at this time due to decreased sitting balance, decreased ability to follow commands, decreased functional use of vision, and increased confusion. Pt reporting that the month is October and mumbling unrelated phrases at times during session. She required multimodal cues to keep eyes open and engage with ADL tasks. Pt's husband reports that similar episodes have been occurring due to pain medications. Additionally, pt with sustained scapular elevation and protraction with tightly contracted scapular stabilizers most noted in trapezii limiting shoulder and cervical movement related to ADL. Pt would continue to benefit from OT services while admitted. Per PT, pt's current functional status during OT re-evaluation is significantly declined. Updated D/C recommendation to home health OT and feel pt will likely progress to be able to D/C to this level of care. If pt does not progress, may need to consider more intensive rehabilitation prior to D/C home. OT will continue to follow while admitted and update D/C recommendations as necessary.     Follow Up Recommendations  Supervision/Assistance - 24 hour;Home health OT     Equipment Recommendations  3 in 1 bedside commode    Recommendations for Other Services       Precautions / Restrictions Precautions Precautions: Back Precaution Booklet Issued: No Precaution Comments: reviewed 3/3 back precautions with pt and pt's spouse Required Braces or Orthoses: Spinal Brace Spinal Brace: Applied in sitting position Restrictions Weight Bearing Restrictions: No      Mobility Bed Mobility Overal bed mobility: Needs Assistance Bed Mobility: Rolling;Sidelying to Sit;Sit to Sidelying Rolling: Mod assist Sidelying to sit: Mod assist     Sit to sidelying: Mod assist General bed mobility comments: Mod assist with step-by-step VC's for log roll technique.   Transfers                 General transfer comment: Unsafe to attempt due to decreased sitting balance and poor ability to follow commands.     Balance Overall balance assessment: Needs assistance Sitting-balance support: No upper extremity supported;Feet supported Sitting balance-Leahy Scale: Poor Sitting balance - Comments: Reliant on B UE support and min assist for sitting balance.  Postural control: Posterior lean                                 ADL either performed or assessed with clinical judgement   ADL Overall ADL's : Needs assistance/impaired Eating/Feeding: Sitting;Maximal assistance   Grooming: Sitting;Maximal assistance   Upper Body Bathing: Maximal assistance;Sitting   Lower Body Bathing: Maximal assistance;Sit to/from stand   Upper Body Dressing : Maximal assistance;Sitting   Lower Body Dressing: Maximal assistance;Sit to/from Market researcher Details (indicate cue type and reason): Unable to test due to decreased stability in sitting balance and poor ability to  follow commands.            General ADL Comments: Pt with minimal ability to participate with ADL as compared to initial OT evaluation. Mumbling throughout session and disoriented.  Only opening her eyes approximately 25% of the time with multimodal cues. Limited scapular mobility with B UE movement and increased trapezius tightness bilaterally with scapulae elevated and protracted. With manual techniques for improved scapular mobility, noted slight improvement but pt with difficulty relaxing despite manual techniques.      Vision Patient Visual Report:  (Sensitive to light) Vision Assessment?: Vision impaired- to be further tested in functional context Additional Comments: Pt able to cross midline gaze to track with therapist briefly but unable to sustain gaze for more than 5 seconds at Ann [name] time. Pt with eyes closed 75% of session and downward gaze throughout. Increased difficulty with lights on versus lights off.      Perception     Praxis      Pertinent Vitals/Pain Pain Assessment: Faces Faces Pain Scale: Hurts even more Pain Location: neck Pain Descriptors / Indicators: Sore;Guarding Pain Intervention(s): Monitored during session;Limited activity within patient's tolerance;Repositioned     Hand Dominance Right   Extremity/Trunk Assessment Upper Extremity Assessment Upper Extremity Assessment: RUE deficits/detail;LUE deficits/detail RUE Deficits / Details: Tremoring at times. Significant trapezius tightness with scapulae elevated and protracted. Tightness throughout UE but able to spontaneously move.  RUE Coordination: decreased gross motor LUE Deficits / Details: Significant trapezius tightness with scapulae elevated and protracted. Limited scapular mobility with manual techniques and limited relaxation following these. LUE Coordination: decreased gross motor   Lower Extremity Assessment Lower Extremity Assessment: Defer to PT evaluation   Cervical / Trunk Assessment Cervical / Trunk Exceptions: Trapezius tightness with minimal lateral cervical flexion and rotation. Limited assessment due to difficulty following commands.    Communication  Communication Communication: No difficulties   Cognition Arousal/Alertness: Lethargic;Suspect due to medications Behavior During Therapy: Restless Overall Cognitive Status: Impaired/Different from baseline Area of Impairment: Orientation;Attention;Memory;Following commands;Safety/judgement;Awareness;Problem solving                 Orientation Level: Disoriented to;Place;Time;Situation Current Attention Level: Sustained Memory: Decreased recall of precautions;Decreased short-term memory Following Commands: Follows one step commands with increased time;Follows one step commands consistently Safety/Judgement: Decreased awareness of safety;Decreased awareness of deficits Awareness: Intellectual Problem Solving: Slow processing;Requires verbal cues;Decreased initiation;Difficulty sequencing;Requires tactile cues General Comments: Pt reporting the month is October and unable to answer what the current year is. Closing eyes throughout session and unable to make eye-contact. Poor ability to follow commands this session with sustained attention for only Ann [name] few minutes at Ann [name] time. Shaking when sitting at EOB and decreased ability to plan and coordinate movement.    General Comments       Exercises     Shoulder Instructions      Home Living Family/patient expects to be discharged to:: Private residence Living Arrangements: Spouse/significant other Available Help at Discharge: Family;Available 24 hours/day Type of Home: House Home Access: Stairs to enter [employer] of Steps: 4 Entrance Stairs-Rails: Can reach both Home Layout: Two level Alternate Level Stairs-Number of Steps: 12 Alternate Level Stairs-Rails: Can reach both Bathroom Shower/Tub: Producer, television/film/video: Handicapped height     Home Equipment: None          Prior Functioning/Environment Level of Independence: Independent        Comments: Pt was home and mobilizing well after prior back  surgery. Completing ADL without physical assistance  with increased time and effort,.        OT Problem List: Decreased strength;Decreased range of motion;Decreased activity tolerance;Impaired balance (sitting and/or standing);Decreased safety awareness;Decreased knowledge of use of DME or AE;Decreased knowledge of precautions;Pain      OT Treatment/Interventions: Self-care/ADL training;Therapeutic exercise;Energy conservation;DME and/or AE instruction;Therapeutic activities;Patient/family education;Balance training;Visual/perceptual remediation/compensation    OT Goals(Current goals can be found in the care plan section) Acute Rehab OT Goals Patient Stated Goal: to go home OT Goal Formulation: With patient Time For Goal Achievement: 03/25/17 Potential to Achieve Goals: Good  OT Frequency: Min 2X/week   Barriers to D/C:            Co-evaluation              AM-PAC PT "6 Clicks" Daily Activity     Outcome Measure Help from another person eating meals?: Ann [name] Lot Help from another person taking care of personal grooming?: Ann [name] Lot Help from another person toileting, which includes using toliet, bedpan, or urinal?: Ann [name] Lot Help from another person bathing (including washing, rinsing, drying)?: Ann [name] Lot Help from another person to put on and taking off regular upper body clothing?: Ann [name] Lot Help from another person to put on and taking off regular lower body clothing?: Ann [name] Lot 6 Click Score: 12   End of Session Equipment Utilized During Treatment: Back brace Nurse Communication: Mobility status (change in cognitive status)  Activity Tolerance: Patient tolerated treatment well Patient left: in bed;with call Ann [name]/phone within reach  OT Visit Diagnosis: Unsteadiness on feet (R26.81);Pain;Muscle weakness (generalized) (M62.81);Other symptoms and signs involving cognitive function Pain - Right/Left: Right Pain - part of body: Leg (back)                Time: 1610-9604 OT Time Calculation (min):  19 min Charges:  OT General Charges $OT Visit: 1 Procedure OT Evaluation $OT Re-eval: 1 Procedure G-Codes:     Doristine Section, MS OTR/L  Pager: [phone]   Ann Ann [name] Makenly [place] 03/13/2017, 4:36 PM

## 2017-03-13 NOTE — Progress Notes (Signed)
PROGRESS NOTE    Ann [name]  WUJ:[id number] DOB: May 06, 1946 DOA: 03/10/2017 PCP: Eliott Nine, MD   Brief Narrative: Ann [name] is a 71 y.o. female with a history of hypothyroidism, Spondylolisthesis of lumbosacral. She presented with posterior headache and concern for meningitis, which has been ruled out. Symptom control.   Assessment & Plan:  Active Problems:   Intractable headache   Severe Acute on Chronic Neck pain   Opioid dependence (HCC)   Gait instability   Generalized weakness   Leukocytosis   Hypothyroidism   Intractable headache Neck pain Evaluated by neurology. Initially thought to be secondary to possible meningitis which has been ruled out via lumbar puncture. Likely related to muscle strain/spasm. -discontinue Flexeril -continue Baclofen -taper opioids -continue gabapentin -trial of steroids -OT evaluation  Opioid dependence Patient uses opiates chronically as an outpatient since having surgery -continue Oxycontin BID -taper prn narcotics -NSAIDs contraindicated secondary to history of perforated ulcer  Hypothyroidism Last TSH of 0.481 - continue Armour 60mg  daily  Generalized weakness -PT evaluation: no PT follow-up  Acute urinary retention Constipation Likely secondary to opiate use -taper down opiates -Miralax scheduled Doculax suppository   DVT prophylaxis: SCDs Code Status: Full code Family Communication: Husband at bedside Disposition Plan: Discharge likely in 24 hours.   Consultants:   Neurology  Neurosurgery  Procedures:   None  Antimicrobials:  Ceftazidime (5/15)  Vancomycin (5/15)    Subjective: Neck pain with some right back pain. No radiation of pain symptoms. No weakness.  Objective: Vitals:   03/12/17 2211 03/13/17 0031 03/13/17 0502 03/13/17 1037  BP: (!) 140/55 (!) 126/51 134/75 120/65  Pulse: 69 85 78 88  Resp: 20 18 18 18   Temp: 97.1 F (36.2 C) 97.6 F (36.4 C) 97.9 F (36.6 C) 100  F (37.8 C)  TempSrc: Oral Oral Oral Oral  SpO2: 95% 96% 97% 98%  Weight:      Height:       No intake or output data in the 24 hours ending 03/13/17 1409 Filed Weights   03/10/17 1928  Weight: 60.6 kg (133 lb 9.6 oz)    Examination:  General exam: Appears calm and comfortable Respiratory system: Clear to auscultation. Respiratory effort normal. Cardiovascular system: S1 & S2 heard, RRR. No murmurs. Gastrointestinal system: Abdomen is nondistended, soft and nontender. Hypoactive bowel sounds heard. Central nervous system: Alert and oriented. No focal neurological deficits. Musculoskeletal: No edema. No calf tenderness. Some tenderness over trapezius muscle Skin: No cyanosis. No rashes Psychiatry: Judgement and insight appear normal. Mood & affect appropriate.     Data Reviewed: I have personally reviewed following labs and imaging studies  CBC:  Recent Labs Lab 03/10/17 0940 03/10/17 2003 03/11/17 0210 03/12/17 0641  WBC 12.6* 13.0* 11.0* 11.7*  NEUTROABS  --  9.5* 6.9  --   HGB 13.8 13.3 12.1 12.3  HCT 41.2 39.2 35.8* 36.6  MCV 88.6 87.5 87.5 87.8  PLT 357 PLATELET CLUMPS NOTED ON SMEAR, UNABLE TO ESTIMATE 369 384   Basic Metabolic Panel:  Recent Labs Lab 03/10/17 0940 03/10/17 2003 03/11/17 0210 03/12/17 0641  NA 136 136 137 137  K 3.6 4.0 3.5 3.9  CL 101 102 104 101  CO2 26 26 26 27   GLUCOSE 127* 109* 108* 116*  BUN 8 7 8 7   CREATININE 0.59 0.52 0.45 0.52  CALCIUM 9.2 9.1 8.7* 9.1   GFR: Estimated Creatinine Clearance: 51.8 mL/min (by C-G formula based on SCr of 0.52 mg/dL). Liver Function Tests:  Recent Labs Lab 03/10/17 2003 03/11/17 0210 03/12/17 0641  AST 28 22 20   ALT 25 23 18   ALKPHOS 88 78 75  BILITOT 0.9 0.4 0.5  PROT 6.4* 6.2* 6.3*  ALBUMIN 3.6 3.2* 3.0*   No results for input(s): LIPASE, AMYLASE in the last 168 hours. No results for input(s): AMMONIA in the last 168 hours. Coagulation Profile:  Recent Labs Lab  03/10/17 2003  INR 1.05   Cardiac Enzymes:  Recent Labs Lab 03/10/17 2003 03/11/17 0210 03/11/17 0735  TROPONINI <0.03 <0.03 <0.03   BNP (last 3 results) No results for input(s): PROBNP in the last 8760 hours. HbA1C: No results for input(s): HGBA1C in the last 72 hours. CBG: No results for input(s): GLUCAP in the last 168 hours. Lipid Profile: No results for input(s): CHOL, HDL, LDLCALC, TRIG, CHOLHDL, LDLDIRECT in the last 72 hours. Thyroid Function Tests:  Recent Labs  03/10/17 2003  TSH 0.481   Anemia Panel: No results for input(s): VITAMINB12, FOLATE, FERRITIN, TIBC, IRON, RETICCTPCT in the last 72 hours. Sepsis Labs:  Recent Labs Lab 03/10/17 0949  LATICACIDVEN 0.89    Recent Results (from the past 240 hour(s))  Culture, Urine     Status: Abnormal   Collection Time: 03/10/17  4:38 PM  Result Value Ref Range Status   Specimen Description URINE, CLEAN CATCH  Final   Special Requests NONE  Final   Culture MULTIPLE SPECIES PRESENT, SUGGEST RECOLLECTION (A)  Final   Report Status 03/11/2017 FINAL  Final  Culture, blood (routine x 2)     Status: None (Preliminary result)   Collection Time: 03/10/17  8:09 PM  Result Value Ref Range Status   Specimen Description BLOOD RIGHT ANTECUBITAL  Final   Special Requests   Final    BOTTLES DRAWN AEROBIC ONLY Blood Culture results may not be optimal due to an inadequate volume of blood received in culture bottles   Culture NO GROWTH 3 DAYS  Final   Report Status PENDING  Incomplete  Culture, blood (routine x 2)     Status: None (Preliminary result)   Collection Time: 03/10/17  8:09 PM  Result Value Ref Range Status   Specimen Description BLOOD RIGHT HAND  Final   Special Requests   Final    BOTTLES DRAWN AEROBIC ONLY Blood Culture results may not be optimal due to an inadequate volume of blood received in culture bottles   Culture PENDING  Incomplete   Report Status PENDING  Incomplete  Anaerobic culture     Status:  None (Preliminary result)   Collection Time: 03/11/17  2:04 PM  Result Value Ref Range Status   Specimen Description CSF  Final   Special Requests NONE  Final   Culture   Final    NO ANAEROBES ISOLATED; CULTURE IN PROGRESS FOR 5 DAYS   Report Status PENDING  Incomplete  CSF culture     Status: None (Preliminary result)   Collection Time: 03/11/17  2:04 PM  Result Value Ref Range Status   Specimen Description CSF  Final   Special Requests NONE  Final   Gram Stain CYTOSPIN SMEAR NO WBC SEEN NO ORGANISMS SEEN   Final   Culture NO GROWTH 2 DAYS  Final   Report Status PENDING  Incomplete  Fungus Culture With Stain     Status: None (Preliminary result)   Collection Time: 03/11/17  2:04 PM  Result Value Ref Range Status   Fungus Stain Final report  Final    Comment: (NOTE)  Performed At: [facility] [address] [place], [place] [id number] Mila Homer MD WU:[id number]    Fungus (Mycology) Culture PENDING  Incomplete   Fungal Source CSF  Final  Fungus Culture Result     Status: None   Collection Time: 03/11/17  2:04 PM  Result Value Ref Range Status   Result 1 Comment  Final    Comment: (NOTE) KOH/Calcofluor preparation:  no fungus observed. Performed At: [facility] [address] [place], [place] [id number] Mila Homer MD VH:[id number]          Radiology Studies: Dg Chest Port 1 View  Result Date: 03/11/2017 CLINICAL DATA:  Elevated white blood cell count. EXAM: PORTABLE CHEST 1 VIEW COMPARISON:  PACs FINDINGS: The lungs are reasonably well inflated. The right hemidiaphragm is higher than the left. There is no alveolar infiltrate or pleural effusion. The heart and pulmonary vascularity are normal. The mediastinum is normal in width. The trachea is midline. The observed bony thorax is unremarkable. IMPRESSION: There is no pneumonia nor other acute cardiopulmonary abnormality. Electronically Signed   By: David  [place] M.D.   On: 03/11/2017 16:50    Dg Fluoro Guide Lumbar Puncture  Result Date: 03/11/2017 Florencia Reasons, MD     03/11/2017  1:50 PM Fluoroscopic guided lumbar puncture performed at L2-L3.  Opening pressure 21 mmH20.  14 mL clear CSF obtained and sent to lab per orders of primary medical team.  See full dictation in PACS for further details.        Scheduled Meds: . acidophilus  1 capsule Oral Daily  . baclofen  5 mg Oral TID  . docusate sodium  200 mg Oral BID  . gabapentin  400 mg Oral TID  . latanoprost  1 drop Both Eyes QHS  . methylPREDNISolone (SOLU-MEDROL) injection  60 mg Intravenous Daily  . multivitamin with minerals  1 tablet Oral Daily  . oxyCODONE  20 mg Oral Q12H  . pantoprazole  40 mg Oral Q0600  . polyethylene glycol  17 g Oral BID  . progesterone  100 mg Oral QHS  . thyroid  60 mg Oral QAC breakfast   Continuous Infusions:   LOS: 2 days     Jacquelin Hawking, MD [facility] 03/13/2017, 2:09 PM Pager: [phone]  If 7PM-7AM, please contact night-coverage [email or web address] Password [email or web address] 03/13/2017, 2:09 PM

## 2017-03-13 NOTE — Progress Notes (Signed)
Physical Therapy Treatment Patient Details Name: Ann [name] MRN: [id number] DOB: 1946/01/18 Today's Date: 03/13/2017    History of Present Illness Pt is a 71 yo female who comes in with intractable HA. She recently underwent lumbar fusion surgery. The neck pain radiates to the posterior head and also reports some difficulty swallowing. She reports photophobia and barely able to open eyes due to light sensitivity. CSF was unremarkable. PMH significant for chronic back pain, glaucoma, headache, history of blood transfusion, history of colon polyps, hypothyroidism, perforated ulcer, peripheral neuropathy, and pneumonia.    PT Comments    Pt making slow progress with mobility but with improved stability with use of AD this session when ambulating. PT will continue to follow pt acutely to ensure a safe d/c home.   Follow Up Recommendations  No PT follow up     Equipment Recommendations  None recommended by PT    Recommendations for Other Services       Precautions / Restrictions Precautions Precautions: Back Precaution Booklet Issued: No Precaution Comments: reviewed 3/3 back precautions with pt and pt's spouse Required Braces or Orthoses: Spinal Brace Spinal Brace: Applied in sitting position Restrictions Weight Bearing Restrictions: No    Mobility  Bed Mobility Overal bed mobility: Needs Assistance Bed Mobility: Rolling;Sidelying to Sit;Sit to Sidelying Rolling: Supervision Sidelying to sit: Supervision     Sit to sidelying: Min assist General bed mobility comments: pt demonstrated good log roll technique, min A with bilateral LEs movement back onto bed  Transfers Overall transfer level: Needs assistance Equipment used: Rolling walker (2 wheeled) Transfers: Sit to/from [employer] Sit to Stand: Min assist Stand pivot transfers: Min assist       General transfer comment: increased time, vc'ing for bilateral hand placement and min A for stability with  transition into standing from bed and with pivotal movements to [facility]  Ambulation/Gait Ambulation/Gait assistance: Min guard Ambulation Distance (Feet): 100 Feet Assistive device: Rolling walker (2 wheeled) Gait Pattern/deviations: Step-through pattern;Decreased stride length Gait velocity: decreased, pt unable to increase gait speed with cueing Gait velocity interpretation: Below normal speed for age/gender General Gait Details: stability greatly improved with use of RW, close min guard   Stairs            Wheelchair Mobility    Modified Rankin (Stroke Patients Only)       Balance Overall balance assessment: Needs assistance Sitting-balance support: No upper extremity supported;Feet supported Sitting balance-Leahy Scale: Good     Standing balance support: No upper extremity supported;During functional activity;Single extremity supported Standing balance-Leahy Scale: Fair                              Cognition Arousal/Alertness: Awake/alert Behavior During Therapy: WFL for tasks assessed/performed Overall Cognitive Status: Impaired/Different from baseline Area of Impairment: Memory;Following commands;Safety/judgement;Problem solving                     Memory: Decreased recall of precautions;Decreased short-term memory Following Commands: Follows one step commands with increased time;Follows one step commands consistently Safety/Judgement: Decreased awareness of safety;Decreased awareness of deficits   Problem Solving: Slow processing;Requires verbal cues        Exercises      General Comments        Pertinent Vitals/Pain Pain Assessment: Faces Faces Pain Scale: Hurts a little bit Pain Location: neck Pain Descriptors / Indicators: Sore;Guarding Pain Intervention(s): Monitored during session;Repositioned    Home  Living                      Prior Function            PT Goals (current goals can now be found in the care  plan section) Acute Rehab PT Goals PT Goal Formulation: With patient Time For Goal Achievement: 03/25/17 Potential to Achieve Goals: Good Progress towards PT goals: Progressing toward goals    Frequency    Min 3X/week      PT Plan Current plan remains appropriate    Co-evaluation              AM-PAC PT "6 Clicks" Daily Activity  Outcome Measure  Difficulty turning over in bed (including adjusting bedclothes, sheets and blankets)?: A Little Difficulty moving from lying on back to sitting on the side of the bed? : A Little Difficulty sitting down on and standing up from a chair with arms (e.g., wheelchair, bedside commode, etc,.)?: A Little Help needed moving to and from a bed to chair (including a wheelchair)?: A Little Help needed walking in hospital room?: A Little Help needed climbing 3-5 steps with a railing? : A Little 6 Click Score: 18    End of Session Equipment Utilized During Treatment: Gait belt;Back brace Activity Tolerance: Patient tolerated treatment well Patient left: in bed;with call bell/phone within reach;with bed alarm set;with family/visitor present Nurse Communication: Mobility status PT Visit Diagnosis: Other abnormalities of gait and mobility (R26.89);Pain Pain - part of body:  (neck)     Time: 6387-5643 PT Time Calculation (min) (ACUTE ONLY): 23 min  Charges:  $Gait Training: 8-22 mins $Therapeutic Activity: 8-22 mins                    G Codes:       [place], [place], [place] [phone]    Alessandra Bevels Joya Willmott 03/13/2017, 10:06 AM

## 2017-03-14 LAB — CSF CULTURE W GRAM STAIN: Culture: NO GROWTH

## 2017-03-14 LAB — CSF CULTURE

## 2017-03-14 MED ORDER — BACLOFEN 10 MG PO TABS: 5.0000 mg | ORAL_TABLET | Freq: Three times a day (TID) | ORAL | 0 refills | Status: DC

## 2017-03-14 MED ORDER — POLYETHYLENE GLYCOL 3350 17 G PO PACK: 17.0000 g | PACK | Freq: Every day | ORAL | 0 refills | Status: DC

## 2017-03-14 NOTE — Progress Notes (Signed)
Pt discharge education and instructions completed with pt and spouse at bedside; both voices understanding and denies any questions. Pt IV removed; pt handed her prescription for Miralax. Pt wanted refill for her oxycodone and oxycontin but was informed to contact pcp or neurosurgeon for refill which pt voices understanding. Pt discharge home with family to transport him home. Pt transported off unit via wheelchair with belongings and family to the side. Dionne Bucy RN

## 2017-03-14 NOTE — Progress Notes (Signed)
Occupational Therapy Treatment Patient Details Name: Ann Bell MRN: [id number] DOB: 26-Nov-1945 Today's Date: 03/14/2017    History of present illness Pt is a 71 yo female who comes in with intractable HA. She recently underwent lumbar fusion surgery. The neck pain radiates to the posterior head and also reports some difficulty swallowing. She reports photophobia and barely able to open eyes due to light sensitivity. CSF was unremarkable. PMH significant for chronic back pain, glaucoma, headache, history of blood transfusion, history of colon polyps, hypothyroidism, perforated ulcer, peripheral neuropathy, and pneumonia.   OT comments  Pt making good progress towards OT goals. Pt back at baseline cognition. She shared that she does not remember the events of last night or hitting anyone at all. "I would never do that!" She was very pleasant and cooperative during session today. She was able to demonstrate good bed mobility, toilet transfer and care, as well as sink level grooming. OT will continue to follow Pt in the acute setting to ensure stability and to reinforce back precautions, maximizing safety and independence in ADL and functional transfers. Next session to focus on tub transfer and LB dressing.  Follow Up Recommendations  Supervision/Assistance - 24 hour    Equipment Recommendations  None recommended by OT (POt has appropriate DME at home)    Recommendations for Other Services      Precautions / Restrictions Precautions Precautions: Back Precaution Booklet Issued: No Precaution Comments: reviewed 3/3 back precautions with pt and pt's spouse Required Braces or Orthoses: Spinal Brace Spinal Brace: Applied in sitting position Restrictions Weight Bearing Restrictions: No       Mobility Bed Mobility Overal bed mobility: Needs Assistance Bed Mobility: Rolling;Sidelying to Sit Rolling: Supervision Sidelying to sit: Supervision       General bed mobility comments: no  assist needed, good technique observed  Transfers Overall transfer level: Needs assistance Equipment used: Rolling walker (2 wheeled) Transfers: Sit to/from Stand Sit to Stand: Min guard         General transfer comment: vc for safe hand placement    Balance Overall balance assessment: Needs assistance Sitting-balance support: No upper extremity supported;Feet supported Sitting balance-Leahy Scale: Fair Sitting balance - Comments: relies on BUE support, but able to don brace in sitting without LOB   Standing balance support: Bilateral upper extremity supported;During functional activity;No upper extremity supported Standing balance-Leahy Scale: Fair Standing balance comment: Able to statically stand without UE support but relies on min assist and/or single UE support for stability during dynamic standing tasks.                            ADL either performed or assessed with clinical judgement   ADL Overall ADL's : Needs assistance/impaired     Grooming: Wash/dry hands;Wash/dry face;Supervision/safety;Standing Grooming Details (indicate cue type and reason): sink level, no cues or assist needed         Upper Body Dressing : Supervision/safety;With caregiver independent assisting;Sitting Upper Body Dressing Details (indicate cue type and reason): Pt and husband able to don brace without assist or verbal cues     Toilet Transfer: Supervision/safety;Comfort height toilet;Ambulation;RW (BSC over toilet, no cues needed)   Toileting- Clothing Manipulation and Hygiene: Supervision/safety;Sit to/from stand Toileting - Clothing Manipulation Details (indicate cue type and reason): able to manage hospital gown and front peri care     Functional mobility during ADLs: Supervision/safety;Rolling walker General ADL Comments: Pt back to "normal" very pleasant and willing to  work with therapy, overall supervision for safety     Vision   Vision Assessment?: No apparent  visual deficits Additional Comments: Pt reports no light sensitivity   Perception     Praxis      Cognition Arousal/Alertness: Awake/alert Behavior During Therapy: WFL for tasks assessed/performed Overall Cognitive Status: Within Functional Limits for tasks assessed                                          Exercises     Shoulder Instructions       General Comments Pt's husband in room and present/participant during session    Pertinent Vitals/ Pain       Pain Assessment: 0-10 Pain Score: 5  Pain Location: neck Pain Descriptors / Indicators: Sore;Guarding Pain Intervention(s): Monitored during session;Repositioned;Relaxation;Heat applied  Home Living                                          Prior Functioning/Environment              Frequency  Min 2X/week        Progress Toward Goals  OT Goals(current goals can now be found in the care plan section)  Progress towards OT goals: Progressing toward goals  Acute Rehab OT Goals Patient Stated Goal: to go home OT Goal Formulation: With patient Time For Goal Achievement: 03/25/17 Potential to Achieve Goals: Good  Plan Discharge plan needs to be updated    Co-evaluation                 AM-PAC PT "6 Clicks" Daily Activity     Outcome Measure   Help from another person eating meals?: None Help from another person taking care of personal grooming?: None Help from another person toileting, which includes using toliet, bedpan, or urinal?: None Help from another person bathing (including washing, rinsing, drying)?: A Little Help from another person to put on and taking off regular upper body clothing?: None Help from another person to put on and taking off regular lower body clothing?: A Little 6 Click Score: 22    End of Session Equipment Utilized During Treatment: Back brace  OT Visit Diagnosis: Unsteadiness on feet (R26.81);Pain;Muscle weakness (generalized)  (M62.81);Other symptoms and signs involving cognitive function Pain - Right/Left: Right Pain - part of body: Leg (back)   Activity Tolerance Patient tolerated treatment well   Patient Left in chair;with call bell/phone within reach;with chair alarm set;with family/visitor present   Nurse Communication Mobility status;Weight bearing status (Pt urinated during session)        Time: 9892-1194 OT Time Calculation (min): 38 min  Charges: OT General Charges $OT Visit: 1 Procedure OT Treatments $Self Care/Home Management : 23-37 mins $Therapeutic Activity: 8-22 mins  Sherryl Manges OTR/L [phone]   Evern Bio Janera Peugh 03/14/2017, 10:26 AM

## 2017-03-14 NOTE — Discharge Summary (Signed)
Physician Discharge Summary  Ann Bell UJW:[id number] DOB: 1946/03/24 DOA: 03/10/2017  PCP: Ann Nine, MD  Admit date: 03/10/2017 Discharge date: 03/14/2017  Admitted From: Home Disposition: Home  Recommendations for Outpatient Follow-up:  1. Follow up with PCP in 1 week  Home Health: None Equipment/Devices: None  Discharge Condition: Stable CODE STATUS: Full code Diet recommendation: Regular diet   Brief/Interim Summary:  Admission HPI written by Cleora Fleet, MD   Chief Complaint: trouble swallowing  HPI: Ann Bell is a 71 y.o. female presented to ED with neck pain and headache about 7 days after having lumbar fusion surgery.  The neck pain radiates to the posterior head and also reports some difficulty swallowing. She reports photophobia and barely able to open eyes due to light sensitivity.  She has never experienced this before.  The pain is at the base of the skull and feels muscular to her but she is not able to tolerate palpation of the area without significant pain and asking the examiner to please stop.  She has some post op weakness in her thighs but no loss of bowel or bladder function and no fever chills nausea or vomiting.   She says she tolerated the lumbar fusion well and had unremarkable postop course until 3 days ago.  Her past medical history is significant for chronic low back pain and neck pain. She had lumbar fusion of L4-L5 by Dr. Bevely Palmer on May 4. Her postop course was unremarkable and she was discharged on 03/02/17. 3 days ago she started to develop gradually worsening neck pain radiating to the posterior head. Not associated with any known trauma, fall or awkward movements.  Over the weekend she called Dr. Mervyn Gay office who prescribed her a muscle relaxer which she has been taking in addition to prescribed oxycodone 20mg  ER and 5-10mg  IR prn. This has not provided significant relief. This morning the pain was severe and worse with any movement  of the neck therefore theycalled Dr. Mervyn Gay office again who recommended her to come to the ED for evaluation for meningitis. She denies any recent fevers or confusion. She reports dizziness and a headache which is consistent with her prior migraine headaches with photophobia and an aura. Also reports dry mouth and difficulty swallowing but is able to swallow. She denies any numbness or tingling, extremity weakness, loss of bowel or bladder function.  Review of Systems: All systems reviewed and apart from history of presenting illness, are negative.    Hospital course:  Intractable headache Neck pain Evaluated by neurology. Initially thought to be secondary to possible meningitis which has been ruled out via lumbar puncture. Empiric ceftazidime and vancomycin were discontinued. Likely related to muscle strain/spasm. Treated with baclofen, PT and OT. Gabapentin continued. Trial of steroids, however, patient did not tolerate secondary to delirium.  Opioid dependence Patient uses opiates chronically as an outpatient since having surgery. Continued outpatient regimen.  Hypothyroidism Last TSH of 0.481. Continued Armour 60mg  daily.  Generalized weakness No PT follow-up. Secondary to deconditioning.  Acute urinary retention Constipation Likely secondary to opiate use. Miralax. Given Doculax and had a bowel movement. Continue Miralax.   Discharge Diagnoses:  Active Problems:   Intractable headache   Severe Acute on Chronic Neck pain   Opioid dependence (HCC)   Gait instability   Generalized weakness   Leukocytosis   Hypothyroidism    Discharge Instructions  Discharge Instructions    Call MD for:  difficulty breathing, headache or visual disturbances  Complete by:  As directed    Call MD for:  persistant dizziness or light-headedness    Complete by:  As directed    Call MD for:  persistant nausea and vomiting    Complete by:  As directed    Call MD for:  severe  uncontrolled pain    Complete by:  As directed    Call MD for:  temperature >100.4    Complete by:  As directed    Diet - low sodium heart healthy    Complete by:  As directed    Increase activity slowly    Complete by:  As directed      Allergies as of 03/14/2017      Reactions   Codeine Nausea And Vomiting      Medication List    STOP taking these medications   cyclobenzaprine 10 MG tablet Commonly known as:  FLEXERIL     TAKE these medications   ALIGN 4 MG Caps Take 4 mg by mouth daily.   baclofen 10 MG tablet Commonly known as:  LIORESAL Take 0.5 tablets (5 mg total) by mouth 3 (three) times daily.   BIEST/PROGESTERONE Crea Place 1 application onto the skin daily.   DHEA PO Take 1 capsule by mouth daily.   docusate sodium 100 MG capsule Commonly known as:  COLACE Take 1 capsule (100 mg total) by mouth 2 (two) times daily.   gabapentin 300 MG capsule Commonly known as:  NEURONTIN Take 1 capsule (300 mg total) by mouth 3 (three) times daily.   latanoprost 0.005 % ophthalmic solution Commonly known as:  XALATAN Place 1 drop into both eyes at bedtime.   multivitamin with minerals Tabs tablet Take 1 tablet by mouth daily.   NP THYROID 60 MG tablet Generic drug:  thyroid Take 60 mg by mouth daily before breakfast.   oxyCODONE 5 MG immediate release tablet Commonly known as:  Oxy IR/ROXICODONE Take 1-2 tablets (5-10 mg total) by mouth every 3 (three) hours as needed for breakthrough pain.   oxyCODONE 20 mg 12 hr tablet Commonly known as:  OXYCONTIN Take 1 tablet (20 mg total) by mouth every 12 (twelve) hours.   polyethylene glycol packet Commonly known as:  MIRALAX / GLYCOLAX Take 17 g by mouth daily.   progesterone 100 MG capsule Commonly known as:  PROMETRIUM Take 100 mg by mouth at bedtime.   ranitidine 75 MG tablet Commonly known as:  ZANTAC Take 75 mg by mouth daily as needed for heartburn.      Follow-up Information    Ann Nine, MD Follow up.   Specialty:  Family Medicine Contact information: [address] [place] [address] [phone]          Allergies  Allergen Reactions  . Codeine Nausea And Vomiting    Consultations:  Neurosurgery  Neurology   Procedures/Studies: Dg Lumbar Spine 2-3 Views  Result Date: 02/28/2017 CLINICAL DATA:  L4-5 laminectomy and fusion. Intraoperative imaging. EXAM: DG C-ARM 61-120 MIN; LUMBAR SPINE - 2-3 VIEW COMPARISON:  CT lumbar spine 02/26/2017. FINDINGS: Two intraoperative fluoroscopic spot views of the lumbar spine demonstrate pedicle screws, stabilization bars interbody spacer in place at L4-5. 0.5 cm anterolisthesis L4 on L5 seen on the comparison examination has been nearly completely reduced. No acute abnormality. IMPRESSION: L4-5 laminectomy and fusion.  No acute abnormality. Electronically Signed   By: Drusilla Kanner M.D.   On: 02/28/2017 12:11   Ct Head Wo Contrast  Result Date: 03/10/2017  CLINICAL DATA:  71 year old female with 3 days of neck pain. Difficulty swallowing. Recent lumbar surgery. EXAM: CT HEAD WITHOUT CONTRAST CT ANGIOGRAPHY NECK TECHNIQUE: Multidetector CT imaging of the head without contrast and CTA neck was performed using bolus administration of intravenous contrast for the neck portion. Multiplanar CT image reconstructions and MIPs were obtained to evaluate the vascular anatomy. Carotid stenosis measurements (when applicable) are obtained utilizing NASCET criteria, using the distal internal carotid diameter as the denominator. CONTRAST:  50 mL Isovue 370 COMPARISON:  Cervical spine MRI 12/11/2010 FINDINGS: CT HEAD Brain: No midline shift, ventriculomegaly, mass effect, evidence of mass lesion, intracranial hemorrhage or evidence of cortically based acute infarction. Gray-white matter differentiation is within normal limits throughout the brain. Calvarium and skull base: Negative. No acute osseous abnormality identified. Paranasal sinuses:  Clear. Orbits: No acute orbit or scalp soft tissue findings. CTA NECK Skeleton: Visualized skull base is intact. No atlanto-occipital dissociation. Mild degenerative appearing anterolisthesis of C3 on C4 is chronic. Straightening of cervical lordosis. Bilateral posterior element alignment is within normal limits. Chronic disc and endplate degeneration at C5-C6 with mild spinal stenosis. No acute osseous abnormality identified. Upper chest: Negative.  No superior mediastinal lymphadenopathy. Other neck: Thyroid, larynx, pharynx, parapharyngeal spaces, sublingual space, right submandibular gland and parotid glands are within normal limits. The left submandibular gland appears atrophied or surgically absent. Partially retropharyngeal course of the right carotid, otherwise negative retropharyngeal space. No cervical lymphadenopathy. Aortic arch: Negative.  Three vessel arch configuration. Right carotid system: Negative aside from a partially retropharyngeal course. Visible right ICA siphon is patent with minor calcified atherosclerosis. Visible right anterior circulation appears normal. A fetal type right PCA origin is suspected. Left carotid system: Negative aside from mild tortuosity. Visible left ICA siphon is patent with mild calcified plaque. Visible left anterior circulation is within normal limits; non dominant left A1 segment and fetal type left PCA origins are suspected. Vertebral arteries: No proximal subclavian artery atherosclerosis or stenosis. Tortuous but otherwise negative right vertebral artery. Negative left vertebral artery. The vertebral arteries are fairly codominant. PICA origins are patent. The distal vertebral and basilar arteries appear diminutive, which appears to beyond the basis of fetal type PCA origins. SCA origins are patent. Review of the MIP images confirms the above findings IMPRESSION: 1.  Normal for age non contrast CT appearance of the brain. 2. Negative CTA neck aside from carotid  and vertebral artery tortuosity. 3. No acute osseous abnormality identified. Cervical spine degeneration appears not significantly changed since 2012. 4. 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 Electronically Signed   By: Odessa Fleming M.D.   On: 03/10/2017 15:16   Ct Angio Neck W And/or Wo Contrast  Result Date: 03/10/2017 CLINICAL DATA:  71 year old female with 3 days of neck pain. Difficulty swallowing. Recent lumbar surgery. EXAM: CT HEAD WITHOUT CONTRAST CT ANGIOGRAPHY NECK TECHNIQUE: Multidetector CT imaging of the head without contrast and CTA neck was performed using bolus administration of intravenous contrast for the neck portion. Multiplanar CT image reconstructions and MIPs were obtained to evaluate the vascular anatomy. Carotid stenosis measurements (when applicable) are obtained utilizing NASCET criteria, using the distal internal carotid diameter as the denominator. CONTRAST:  50 mL Isovue 370 COMPARISON:  Cervical spine MRI 12/11/2010 FINDINGS: CT HEAD Brain: No midline shift, ventriculomegaly, mass effect, evidence of mass lesion, intracranial hemorrhage or evidence of cortically based acute infarction. Gray-white matter differentiation is within normal limits throughout the brain. Calvarium and skull  base: Negative. No acute osseous abnormality identified. Paranasal sinuses: Clear. Orbits: No acute orbit or scalp soft tissue findings. CTA NECK Skeleton: Visualized skull base is intact. No atlanto-occipital dissociation. Mild degenerative appearing anterolisthesis of C3 on C4 is chronic. Straightening of cervical lordosis. Bilateral posterior element alignment is within normal limits. Chronic disc and endplate degeneration at C5-C6 with mild spinal stenosis. No acute osseous abnormality identified. Upper chest: Negative.  No superior mediastinal lymphadenopathy. Other neck: Thyroid, larynx, pharynx, parapharyngeal spaces, sublingual  space, right submandibular gland and parotid glands are within normal limits. The left submandibular gland appears atrophied or surgically absent. Partially retropharyngeal course of the right carotid, otherwise negative retropharyngeal space. No cervical lymphadenopathy. Aortic arch: Negative.  Three vessel arch configuration. Right carotid system: Negative aside from a partially retropharyngeal course. Visible right ICA siphon is patent with minor calcified atherosclerosis. Visible right anterior circulation appears normal. A fetal type right PCA origin is suspected. Left carotid system: Negative aside from mild tortuosity. Visible left ICA siphon is patent with mild calcified plaque. Visible left anterior circulation is within normal limits; non dominant left A1 segment and fetal type left PCA origins are suspected. Vertebral arteries: No proximal subclavian artery atherosclerosis or stenosis. Tortuous but otherwise negative right vertebral artery. Negative left vertebral artery. The vertebral arteries are fairly codominant. PICA origins are patent. The distal vertebral and basilar arteries appear diminutive, which appears to beyond the basis of fetal type PCA origins. SCA origins are patent. Review of the MIP images confirms the above findings IMPRESSION: 1.  Normal for age non contrast CT appearance of the brain. 2. Negative CTA neck aside from carotid and vertebral artery tortuosity. 3. No acute osseous abnormality identified. Cervical spine degeneration appears not significantly changed since 2012. 4. 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 Electronically Signed   By: Odessa Fleming M.D.   On: 03/10/2017 15:16   Ct Lumbar Spine Wo Contrast  Result Date: 02/26/2017 CLINICAL DATA:  Surgical planning, lumbosacral spondylosis and radiculopathy. EXAM: CT LUMBAR SPINE WITHOUT CONTRAST TECHNIQUE: Multidetector CT imaging of the lumbar spine was performed  without intravenous contrast administration. Multiplanar CT image reconstructions were also generated. Mazor protocol. COMPARISON:  MRI of lumbar spine January 11, 2017 FINDINGS: SEGMENTATION: For the purposes of this report the last well-formed intervertebral disc space is reported as L5-S1. ALIGNMENT: Maintained lumbar lordosis. Grade 1 (5 mm) L4-5 anterolisthesis without spondylolysis. VERTEBRAE: Vertebral bodies and posterior elements are intact. Severe C5-6 disc height loss with vacuum disc, endplate sclerosis and marginal spurring compatible with degenerative disc. Mild L4-5 disc height loss. No destructive bony lesions. PARASPINAL AND OTHER SOFT TISSUES: Included prevertebral and paraspinal soft tissues are nonacute. Mild calcific atherosclerosis of the of the aortoiliac vessels. DISC LEVELS: T12-L1, L1-2, L2-3: No disc bulge, canal stenosis nor neural foraminal narrowing. L3-4: Annular bulging. Mild LEFT facet arthropathy without canal stenosis or neural foraminal narrowing. L4-5: Anterolisthesis. Moderate to severe facet arthropathy and ligamentum flavum redundancy. Moderate to severe canal stenosis and lateral recess effacement which likely affects the traversing L5 nerves. Moderate to severe RIGHT, mild LEFT neural foraminal narrowing. L5-S1: Small broad-based disc osteophyte complex. Mild facet arthropathy and ligamentum flavum redundancy without canal stenosis. Moderate to severe bilateral neural foraminal narrowing. IMPRESSION: Grade 1 L4-5 anterolisthesis on degenerative basis. Moderate to severe canal stenosis L4-5. Moderate to severe L4-5 and L5-S1 neural foraminal narrowing. Electronically Signed   By: Awilda Metro M.D.   On: 02/26/2017 16:44  Mr Laqueta Jean Wo Contrast  Result Date: 03/10/2017 CLINICAL DATA:  Neck pain and headache for 1 week after lumbar spine surgery. Difficulty swallowing, photophobia. Evaluate intractable headache. EXAM: MRI HEAD WITHOUT AND WITH CONTRAST MRI CERVICAL  SPINE WITHOUT CONTRAST TECHNIQUE: Multiplanar, multiecho pulse sequences of the brain and surrounding structures were obtained without and with intravenous contrast. Multiplanar multi echo pulse sequences of the cervical spine, to include the craniocervical junction and cervicothoracic junction, were obtained without intravenous contrast. CONTRAST:  10mL MULTIHANCE GADOBENATE DIMEGLUMINE 529 MG/ML IV SOLN COMPARISON:  CT HEAD and CTA HEAD and neck Mar 10, 2017 FINDINGS: MRI HEAD FINDINGS INTRACRANIAL CONTENTS: No reduced diffusion to suggest acute ischemia. No susceptibility artifact to suggest hemorrhage. The ventricles and sulci are normal for patient's age. No suspicious parenchymal signal, masses, mass effect. No abnormal intraparenchymal or extra-axial enhancement. Perivascular space RIGHT thalamus. No abnormal extra-axial fluid collections. Mildly prominent extra-axial spaces at the convexity, likely normal variant without corroborative findings of intracranial hypotension such as sagging midbrain or dural enhancement. No extra-axial masses. VASCULAR: Normal major intracranial vascular flow voids present at skull base. SKULL AND UPPER CERVICAL SPINE: No abnormal sellar expansion. No suspicious calvarial bone marrow signal. Craniocervical junction maintained. SINUSES/ORBITS: The mastoid air-cells and included paranasal sinuses are well-aerated.The included ocular globes and orbital contents are non-suspicious. Status post bilateral ocular lens implants. OTHER: None. MRI CERVICAL SPINE FINDINGS ALIGNMENT: Maintained cervical lordosis.  No malalignment. VERTEBRAE/DISCS: Vertebral bodies are intact. Moderate to severe C5-6 disc height loss, moderate C4-5 and mild at C6-7 with proportional chronic discogenic endplate changes. Disc desiccation all cervical levels. Mild periarticular bright STIR signal LEFT C3-4 facets. CORD:Cervical spinal cord is normal morphology and signal characteristics from the  cervicomedullary junction to level of T3-4, the most caudal well visualized level. POSTERIOR FOSSA, VERTEBRAL ARTERIES, PARASPINAL TISSUES: No MR findings of ligamentous injury. Vertebral artery flow voids present. Included posterior fossa and paraspinal soft tissues are normal. DISC LEVELS: C2-3: No disc bulge, canal stenosis nor neural foraminal narrowing. C3-4: Annular bulging, uncovertebral hypertrophy and tiny central disc protrusion. Mild facet arthropathy. No canal stenosis or neural foraminal narrowing. C4-5: Small broad-based disc bulge, uncovertebral hypertrophy and mild facet arthropathy. No canal stenosis. Mild RIGHT neural foraminal narrowing. C5-6: Small broad-based disc bulge, uncovertebral hypertrophy and mild facet arthropathy. Mild canal stenosis. Mild RIGHT, moderate LEFT neural foraminal narrowing. C6-7: Small broad-based disc bulge. Uncovertebral hypertrophy. No canal stenosis. Mild LEFT neural foraminal narrowing. C7-T1: No disc bulge, canal stenosis nor neural foraminal narrowing. IMPRESSION: MRI head: Negative MRI of the head for age with and without contrast. MRI cervical spine: Degenerative cervical spine resulting in mild canal stenosis at C5-6. Neural foraminal narrowing C4-5 through C6-7: Moderate on the LEFT at C5-6. LEFT C3-4 facet arthropathy and mild acute reactive changes. Electronically Signed   By: Awilda Metro M.D.   On: 03/10/2017 18:53   Mr Cervical Spine Wo Contrast  Result Date: 03/10/2017 CLINICAL DATA:  Neck pain and headache for 1 week after lumbar spine surgery. Difficulty swallowing, photophobia. Evaluate intractable headache. EXAM: MRI HEAD WITHOUT AND WITH CONTRAST MRI CERVICAL SPINE WITHOUT CONTRAST TECHNIQUE: Multiplanar, multiecho pulse sequences of the brain and surrounding structures were obtained without and with intravenous contrast. Multiplanar multi echo pulse sequences of the cervical spine, to include the craniocervical junction and cervicothoracic  junction, were obtained without intravenous contrast. CONTRAST:  10mL MULTIHANCE GADOBENATE DIMEGLUMINE 529 MG/ML IV SOLN COMPARISON:  CT HEAD and CTA HEAD and neck Mar 10, 2017 FINDINGS: MRI  HEAD FINDINGS INTRACRANIAL CONTENTS: No reduced diffusion to suggest acute ischemia. No susceptibility artifact to suggest hemorrhage. The ventricles and sulci are normal for patient's age. No suspicious parenchymal signal, masses, mass effect. No abnormal intraparenchymal or extra-axial enhancement. Perivascular space RIGHT thalamus. No abnormal extra-axial fluid collections. Mildly prominent extra-axial spaces at the convexity, likely normal variant without corroborative findings of intracranial hypotension such as sagging midbrain or dural enhancement. No extra-axial masses. VASCULAR: Normal major intracranial vascular flow voids present at skull base. SKULL AND UPPER CERVICAL SPINE: No abnormal sellar expansion. No suspicious calvarial bone marrow signal. Craniocervical junction maintained. SINUSES/ORBITS: The mastoid air-cells and included paranasal sinuses are well-aerated.The included ocular globes and orbital contents are non-suspicious. Status post bilateral ocular lens implants. OTHER: None. MRI CERVICAL SPINE FINDINGS ALIGNMENT: Maintained cervical lordosis.  No malalignment. VERTEBRAE/DISCS: Vertebral bodies are intact. Moderate to severe C5-6 disc height loss, moderate C4-5 and mild at C6-7 with proportional chronic discogenic endplate changes. Disc desiccation all cervical levels. Mild periarticular bright STIR signal LEFT C3-4 facets. CORD:Cervical spinal cord is normal morphology and signal characteristics from the cervicomedullary junction to level of T3-4, the most caudal well visualized level. POSTERIOR FOSSA, VERTEBRAL ARTERIES, PARASPINAL TISSUES: No MR findings of ligamentous injury. Vertebral artery flow voids present. Included posterior fossa and paraspinal soft tissues are normal. DISC LEVELS: C2-3: No  disc bulge, canal stenosis nor neural foraminal narrowing. C3-4: Annular bulging, uncovertebral hypertrophy and tiny central disc protrusion. Mild facet arthropathy. No canal stenosis or neural foraminal narrowing. C4-5: Small broad-based disc bulge, uncovertebral hypertrophy and mild facet arthropathy. No canal stenosis. Mild RIGHT neural foraminal narrowing. C5-6: Small broad-based disc bulge, uncovertebral hypertrophy and mild facet arthropathy. Mild canal stenosis. Mild RIGHT, moderate LEFT neural foraminal narrowing. C6-7: Small broad-based disc bulge. Uncovertebral hypertrophy. No canal stenosis. Mild LEFT neural foraminal narrowing. C7-T1: No disc bulge, canal stenosis nor neural foraminal narrowing. IMPRESSION: MRI head: Negative MRI of the head for age with and without contrast. MRI cervical spine: Degenerative cervical spine resulting in mild canal stenosis at C5-6. Neural foraminal narrowing C4-5 through C6-7: Moderate on the LEFT at C5-6. LEFT C3-4 facet arthropathy and mild acute reactive changes. Electronically Signed   By: Awilda Metro M.D.   On: 03/10/2017 18:53   Dg Chest Port 1 View  Result Date: 03/11/2017 CLINICAL DATA:  Elevated white blood cell count. EXAM: PORTABLE CHEST 1 VIEW COMPARISON:  PACs FINDINGS: The lungs are reasonably well inflated. The right hemidiaphragm is higher than the left. There is no alveolar infiltrate or pleural effusion. The heart and pulmonary vascularity are normal. The mediastinum is normal in width. The trachea is midline. The observed bony thorax is unremarkable. IMPRESSION: There is no pneumonia nor other acute cardiopulmonary abnormality. Electronically Signed   By: David  [place] M.D.   On: 03/11/2017 16:50   Dg C-arm 61-120 Min  Result Date: 02/28/2017 CLINICAL DATA:  L4-5 laminectomy and fusion. Intraoperative imaging. EXAM: DG C-ARM 61-120 MIN; LUMBAR SPINE - 2-3 VIEW COMPARISON:  CT lumbar spine 02/26/2017. FINDINGS: Two intraoperative  fluoroscopic spot views of the lumbar spine demonstrate pedicle screws, stabilization bars interbody spacer in place at L4-5. 0.5 cm anterolisthesis L4 on L5 seen on the comparison examination has been nearly completely reduced. No acute abnormality. IMPRESSION: L4-5 laminectomy and fusion.  No acute abnormality. Electronically Signed   By: Drusilla Kanner M.D.   On: 02/28/2017 12:11   Dg Fluoro Guide Lumbar Puncture  Result Date: 03/11/2017 Florencia Reasons, MD  03/11/2017  1:50 PM Fluoroscopic guided lumbar puncture performed at L2-L3.  Opening pressure 21 mmH20.  14 mL clear CSF obtained and sent to lab per orders of primary medical team.  See full dictation in PACS for further details.      Subjective: Patient reports improvement in neck pain. She has more mobility in her neck.  Discharge Exam: Vitals:   03/14/17 1004 03/14/17 1303  BP: (!) 146/83 124/79  Pulse: (!) 106 64  Resp: 20 20  Temp: 97.7 F (36.5 C) 97.9 F (36.6 C)   Vitals:   03/14/17 0102 03/14/17 0533 03/14/17 1004 03/14/17 1303  BP: 132/73 (!) 148/73 (!) 146/83 124/79  Pulse: 79 81 (!) 106 64  Resp: 18 18 20 20   Temp: 97.7 F (36.5 C) 97.7 F (36.5 C) 97.7 F (36.5 C) 97.9 F (36.6 C)  TempSrc: Oral Oral Oral Oral  SpO2: 97% 100% 96% 93%  Weight:      Height:        General: Pt is alert, awake, not in acute distress Cardiovascular: RRR, S1/S2 +, no rubs, no gallops Respiratory: CTA bilaterally, no wheezing, no rhonchi Abdominal: Soft, NT, ND, bowel sounds + Extremities: no edema, no cyanosis    The results of significant diagnostics from this hospitalization (including imaging, microbiology, ancillary and laboratory) are listed below for reference.     Microbiology: Recent Results (from the past 240 hour(s))  Culture, Urine     Status: Abnormal   Collection Time: 03/10/17  4:38 PM  Result Value Ref Range Status   Specimen Description URINE, CLEAN CATCH  Final   Special Requests NONE   Final   Culture MULTIPLE SPECIES PRESENT, SUGGEST RECOLLECTION (A)  Final   Report Status 03/11/2017 FINAL  Final  Culture, blood (routine x 2)     Status: None (Preliminary result)   Collection Time: 03/10/17  8:09 PM  Result Value Ref Range Status   Specimen Description BLOOD RIGHT ANTECUBITAL  Final   Special Requests   Final    BOTTLES DRAWN AEROBIC ONLY Blood Culture results may not be optimal due to an inadequate volume of blood received in culture bottles   Culture NO GROWTH 4 DAYS  Final   Report Status PENDING  Incomplete  Culture, blood (routine x 2)     Status: None (Preliminary result)   Collection Time: 03/10/17  8:09 PM  Result Value Ref Range Status   Specimen Description BLOOD RIGHT HAND  Final   Special Requests   Final    BOTTLES DRAWN AEROBIC ONLY Blood Culture results may not be optimal due to an inadequate volume of blood received in culture bottles   Culture NO GROWTH 4 DAYS  Final   Report Status PENDING  Incomplete  Anaerobic culture     Status: None (Preliminary result)   Collection Time: 03/11/17  2:04 PM  Result Value Ref Range Status   Specimen Description CSF  Final   Special Requests NONE  Final   Culture   Final    NO ANAEROBES ISOLATED; CULTURE IN PROGRESS FOR 5 DAYS   Report Status PENDING  Incomplete  CSF culture     Status: None   Collection Time: 03/11/17  2:04 PM  Result Value Ref Range Status   Specimen Description CSF  Final   Special Requests NONE  Final   Gram Stain CYTOSPIN SMEAR NO WBC SEEN NO ORGANISMS SEEN   Final   Culture NO GROWTH 3 DAYS  Final   Report  Status 03/14/2017 FINAL  Final  Fungus Culture With Stain     Status: None (Preliminary result)   Collection Time: 03/11/17  2:04 PM  Result Value Ref Range Status   Fungus Stain Final report  Final    Comment: (NOTE) Performed At: [facility] [address] [place], [place] [id number] Mila Homer MD [id number]    Fungus (Mycology) Culture PENDING   Incomplete   Fungal Source CSF  Final  Fungus Culture Result     Status: None   Collection Time: 03/11/17  2:04 PM  Result Value Ref Range Status   Result 1 Comment  Final    Comment: (NOTE) KOH/Calcofluor preparation:  no fungus observed. Performed At: [facility] [address] [place], [place] [id number] Mila Homer MD LK:[id number]      Labs: BNP (last 3 results) No results for input(s): BNP in the last 8760 hours. Basic Metabolic Panel:  Recent Labs Lab 03/10/17 0940 03/10/17 2003 03/11/17 0210 03/12/17 0641  NA 136 136 137 137  K 3.6 4.0 3.5 3.9  CL 101 102 104 101  CO2 26 26 26 27   GLUCOSE 127* 109* 108* 116*  BUN 8 7 8 7   CREATININE 0.59 0.52 0.45 0.52  CALCIUM 9.2 9.1 8.7* 9.1   Liver Function Tests:  Recent Labs Lab 03/10/17 2003 03/11/17 0210 03/12/17 0641  AST 28 22 20   ALT 25 23 18   ALKPHOS 88 78 75  BILITOT 0.9 0.4 0.5  PROT 6.4* 6.2* 6.3*  ALBUMIN 3.6 3.2* 3.0*   No results for input(s): LIPASE, AMYLASE in the last 168 hours. No results for input(s): AMMONIA in the last 168 hours. CBC:  Recent Labs Lab 03/10/17 0940 03/10/17 2003 03/11/17 0210 03/12/17 0641  WBC 12.6* 13.0* 11.0* 11.7*  NEUTROABS  --  9.5* 6.9  --   HGB 13.8 13.3 12.1 12.3  HCT 41.2 39.2 35.8* 36.6  MCV 88.6 87.5 87.5 87.8  PLT 357 PLATELET CLUMPS NOTED ON SMEAR, UNABLE TO ESTIMATE 369 384   Cardiac Enzymes:  Recent Labs Lab 03/10/17 2003 03/11/17 0210 03/11/17 0735  TROPONINI <0.03 <0.03 <0.03   BNP: Invalid input(s): POCBNP CBG:  Recent Labs Lab 03/13/17 2230  GLUCAP 157*   D-Dimer No results for input(s): DDIMER in the last 72 hours. Hgb A1c No results for input(s): HGBA1C in the last 72 hours. Lipid Profile No results for input(s): CHOL, HDL, LDLCALC, TRIG, CHOLHDL, LDLDIRECT in the last 72 hours. Thyroid function studies No results for input(s): TSH, T4TOTAL, T3FREE, THYROIDAB in the last 72 hours.  Invalid input(s):  FREET3 Anemia work up No results for input(s): VITAMINB12, FOLATE, FERRITIN, TIBC, IRON, RETICCTPCT in the last 72 hours. Urinalysis    Component Value Date/Time   COLORURINE YELLOW 03/10/2017 1412   APPEARANCEUR CLEAR 03/10/2017 1412   LABSPEC 1.038 (H) 03/10/2017 1412   PHURINE 7.0 03/10/2017 1412   GLUCOSEU NEGATIVE 03/10/2017 1412   HGBUR NEGATIVE 03/10/2017 1412   BILIRUBINUR NEGATIVE 03/10/2017 1412   KETONESUR NEGATIVE 03/10/2017 1412   PROTEINUR NEGATIVE 03/10/2017 1412   NITRITE NEGATIVE 03/10/2017 1412   LEUKOCYTESUR TRACE (A) 03/10/2017 1412   Sepsis Labs Invalid input(s): PROCALCITONIN,  WBC,  LACTICIDVEN Microbiology Recent Results (from the past 240 hour(s))  Culture, Urine     Status: Abnormal   Collection Time: 03/10/17  4:38 PM  Result Value Ref Range Status   Specimen Description URINE, CLEAN CATCH  Final   Special Requests NONE  Final   Culture MULTIPLE  SPECIES PRESENT, SUGGEST RECOLLECTION (A)  Final   Report Status 03/11/2017 FINAL  Final  Culture, blood (routine x 2)     Status: None (Preliminary result)   Collection Time: 03/10/17  8:09 PM  Result Value Ref Range Status   Specimen Description BLOOD RIGHT ANTECUBITAL  Final   Special Requests   Final    BOTTLES DRAWN AEROBIC ONLY Blood Culture results may not be optimal due to an inadequate volume of blood received in culture bottles   Culture NO GROWTH 4 DAYS  Final   Report Status PENDING  Incomplete  Culture, blood (routine x 2)     Status: None (Preliminary result)   Collection Time: 03/10/17  8:09 PM  Result Value Ref Range Status   Specimen Description BLOOD RIGHT HAND  Final   Special Requests   Final    BOTTLES DRAWN AEROBIC ONLY Blood Culture results may not be optimal due to an inadequate volume of blood received in culture bottles   Culture NO GROWTH 4 DAYS  Final   Report Status PENDING  Incomplete  Anaerobic culture     Status: None (Preliminary result)   Collection Time: 03/11/17  2:04  PM  Result Value Ref Range Status   Specimen Description CSF  Final   Special Requests NONE  Final   Culture   Final    NO ANAEROBES ISOLATED; CULTURE IN PROGRESS FOR 5 DAYS   Report Status PENDING  Incomplete  CSF culture     Status: None   Collection Time: 03/11/17  2:04 PM  Result Value Ref Range Status   Specimen Description CSF  Final   Special Requests NONE  Final   Gram Stain CYTOSPIN SMEAR NO WBC SEEN NO ORGANISMS SEEN   Final   Culture NO GROWTH 3 DAYS  Final   Report Status 03/14/2017 FINAL  Final  Fungus Culture With Stain     Status: None (Preliminary result)   Collection Time: 03/11/17  2:04 PM  Result Value Ref Range Status   Fungus Stain Final report  Final    Comment: (NOTE) Performed At: [facility] [address] [place], [place] [id number] Mila Homer MD QB:[id number]    Fungus (Mycology) Culture PENDING  Incomplete   Fungal Source CSF  Final  Fungus Culture Result     Status: None   Collection Time: 03/11/17  2:04 PM  Result Value Ref Range Status   Result 1 Comment  Final    Comment: (NOTE) KOH/Calcofluor preparation:  no fungus observed. Performed At: [facility] [address] [place], [place] [id number] Mila Homer MD ME:[id number]      Time coordinating discharge: Over 30 minutes  SIGNED:   Jacquelin Hawking, MD [facility] 03/14/2017, 4:23 PM Pager [phone]  If 7PM-7AM, please contact night-coverage [email or web address] Password [email or web address]

## 2017-03-14 NOTE — Evaluation (Signed)
Physical Therapy Evaluation Patient Details Name: Ann Bell MRN: [id number] DOB: 12-09-1945 Today's Date: 03/14/2017   History of Present Illness  Pt is a 71 yo female who comes in with intractable HA. She recently underwent lumbar fusion surgery. The neck pain radiates to the posterior head and also reports some difficulty swallowing. She reports photophobia and barely able to open eyes due to light sensitivity. CSF was unremarkable. PMH significant for chronic back pain, glaucoma, headache, history of blood transfusion, history of colon polyps, hypothyroidism, perforated ulcer, peripheral neuropathy, and pneumonia.  Clinical Impression  Pt progressing towards goals and able to increase ambulation tolerance this session. Pt very pleasant and cooperative during session and reports she does not remember last nights events at all. Pt requiring min guard to supervision for all functional mobility tasks this session. Requesting RW for use at home to increase safety and stability, so DME recommendations updated. Will continue to follow and progress mobility.     Follow Up Recommendations No PT follow up    Equipment Recommendations  Rolling walker with 5" wheels    Recommendations for Other Services       Precautions / Restrictions Precautions Precautions: Back Precaution Booklet Issued: No Precaution Comments: Pt able to recall 1/3 back precautions. Required verbal cues for no lifting/twisting back precautions.  Required Braces or Orthoses: Spinal Brace Spinal Brace: Lumbar corset;Applied in sitting position Restrictions Weight Bearing Restrictions: No      Mobility  Bed Mobility Overal bed mobility: Needs Assistance Bed Mobility: Rolling;Sidelying to Sit;Sit to Sidelying Rolling: Supervision Sidelying to sit: Supervision     Sit to sidelying: Supervision General bed mobility comments: No Assist required. Required extended time to complete. Demonstrated good log roll technique.    Transfers Overall transfer level: Needs assistance Equipment used: Rolling walker (2 wheeled) Transfers: Sit to/from Stand Sit to Stand: Min guard         General transfer comment: Verbal cues for safe hand placement as pt was pulling up on walker. Verbal cues for appropriate hand placement on RW once standing.   Ambulation/Gait Ambulation/Gait assistance: Min guard Ambulation Distance (Feet): 200 Feet Assistive device: Rolling walker (2 wheeled) Gait Pattern/deviations: Step-through pattern;Decreased stride length Gait velocity: decreased Gait velocity interpretation: Below normal speed for age/gender General Gait Details: Slight LOB noted during turn, however, pt able to self correct. Overall steady gait with use of RW. Pt reporting she would like to have RW at d/c for safety at home.   Stairs            Wheelchair Mobility    Modified Rankin (Stroke Patients Only)       Balance Overall balance assessment: Needs assistance Sitting-balance support: No upper extremity supported;Feet supported Sitting balance-Leahy Scale: Good     Standing balance support: Bilateral upper extremity supported;During functional activity;No upper extremity supported Standing balance-Leahy Scale: Poor Standing balance comment: Reliant on UE during mobility tasks                              Pertinent Vitals/Pain Pain Assessment: Faces Faces Pain Scale: Hurts little more Pain Location: neck Pain Descriptors / Indicators: Sore;Guarding Pain Intervention(s): Limited activity within patient's tolerance;Monitored during session;Repositioned    Home Living                        Prior Function  Hand Dominance        Extremity/Trunk Assessment                Communication      Cognition Arousal/Alertness: Awake/alert Behavior During Therapy: WFL for tasks assessed/performed Overall Cognitive Status: Impaired/Different from  baseline Area of Impairment: Memory                     Memory: Decreased recall of precautions;Decreased short-term memory         General Comments: Pt reporting she is better, but still presenting with difficulty with recalling events of previous night and with STM.       General Comments General comments (skin integrity, edema, etc.): Pt's husband present during session. Report of improved behavior since last night. Pt very pleasant throughout session and reports she has no recollection of events of last night.     Exercises     Assessment/Plan    PT Assessment    PT Problem List         PT Treatment Interventions      PT Goals (Current goals can be found in the Care Plan section)  Acute Rehab PT Goals PT Goal Formulation: With patient Time For Goal Achievement: 03/25/17 Potential to Achieve Goals: Good    Frequency Min 3X/week   Barriers to discharge        Co-evaluation               AM-PAC PT "6 Clicks" Daily Activity  Outcome Measure Difficulty turning over in bed (including adjusting bedclothes, sheets and blankets)?: A Little Difficulty moving from lying on back to sitting on the side of the bed? : A Little Difficulty sitting down on and standing up from a chair with arms (e.g., wheelchair, bedside commode, etc,.)?: A Little Help needed moving to and from a bed to chair (including a wheelchair)?: A Little Help needed walking in hospital room?: A Little Help needed climbing 3-5 steps with a railing? : A Little 6 Click Score: 18    End of Session Equipment Utilized During Treatment: Gait belt;Back brace Activity Tolerance: Patient tolerated treatment well Patient left: in bed;with call bell/phone within reach;with bed alarm set;with family/visitor present Nurse Communication: Mobility status PT Visit Diagnosis: Other abnormalities of gait and mobility (R26.89);Pain Pain - part of body:  (neck )    Time: 1610-9604 PT Time Calculation  (min) (ACUTE ONLY): 17 min   Charges:     PT Treatments $Gait Training: 8-22 mins   PT G Codes:        Margot Chimes, PT, DPT  Acute Rehabilitation Services  Pager: [phone]   Melvyn Novas 03/14/2017, 5:16 PM

## 2017-03-14 NOTE — Discharge Instructions (Signed)
Ann Bell,  You were admitted for concern of meningitis. Your lab tests did not show evidence of a bacterial meningitis. Your neck pain is likely related to your muscles. Physical and occupational therapy worked with you. Please continue the exercises that they discussed with you and follow-up with your primary care physician.

## 2017-03-14 NOTE — Progress Notes (Signed)
Pt needed a home DME walker but it was unavailable; charge RN notified and said will pass on so pt will have her equipment delivered to her at home tomorrow. Dionne Bucy RN

## 2017-03-15 LAB — CULTURE, BLOOD (ROUTINE X 2)
CULTURE: NO GROWTH
Culture: NO GROWTH

## 2017-03-16 LAB — ANAEROBIC CULTURE

## 2017-04-10 LAB — FUNGUS CULTURE RESULT

## 2017-04-10 LAB — FUNGUS CULTURE WITH STAIN

## 2017-04-10 LAB — FUNGAL ORGANISM REFLEX

## 2017-04-23 DIAGNOSIS — Z9049 Acquired absence of other specified parts of digestive tract: Secondary | ICD-10-CM | POA: Insufficient documentation

## 2017-05-16 ENCOUNTER — Ambulatory Visit: Payer: Medicare HMO | Attending: Family Medicine | Admitting: Physical Therapy

## 2017-05-16 ENCOUNTER — Encounter: Payer: Self-pay | Admitting: Physical Therapy

## 2017-05-16 DIAGNOSIS — R29898 Other symptoms and signs involving the musculoskeletal system: Secondary | ICD-10-CM | POA: Diagnosis present

## 2017-05-16 DIAGNOSIS — M542 Cervicalgia: Secondary | ICD-10-CM | POA: Insufficient documentation

## 2017-05-16 DIAGNOSIS — R42 Dizziness and giddiness: Secondary | ICD-10-CM | POA: Diagnosis present

## 2017-05-16 NOTE — Therapy (Signed)
[facility] [address] [place], [place], [address] Phone: [phone]   Fax:  [phone]  Physical Therapy Evaluation  Patient Details  Name: Ann Bell MRN: [id number] Date of Birth: 10-13-1946 Referring Provider: Eliott Nine  Encounter Date: 05/16/2017      PT End of Session - 05/16/17 1123    Visit Number 1   Date for PT Re-Evaluation 07/11/17   PT Start Time 1004   PT Stop Time 1110   PT Time Calculation (min) 66 min   Activity Tolerance Patient tolerated treatment well   Behavior During Therapy [facility] for tasks assessed/performed      Past Medical History:  Diagnosis Date  . Chronic back pain    spondylolisthesis  . Glaucoma   . Headache   . History of blood transfusion    no abnormal reaction noted  . History of colon polyps    benign  . Hypothyroidism    takes NP Thyroid  . Perforated ulcer (HCC)   . Peripheral neuropathy   . Pneumonia    hx of-yrs ago    Past Surgical History:  Procedure Laterality Date  . ABDOMINAL HYSTERECTOMY    . ANTERIOR LAT LUMBAR FUSION N/A 02/28/2017   Procedure: Lumbar four-five Transpsoas lateral interbody fusion with Lumbar four-five Percutaneous pedicle screw fixation, posterolateral arthrodesis with  Mazor ;  Surgeon: Ditty, Loura Halt, MD;  Location: [facility] OR;  Service: Neurosurgery;  Laterality: N/A;  . APPENDECTOMY  1965  . APPLICATION OF ROBOTIC ASSISTANCE FOR SPINAL PROCEDURE N/A 02/28/2017   Procedure: APPLICATION OF ROBOTIC ASSISTANCE FOR SPINAL PROCEDURE;  Surgeon: Ditty, Loura Halt, MD;  Location: [facility] OR;  Service: Neurosurgery;  Laterality: N/A;  . cataract surgery Bilateral   . CESAREAN SECTION     x 2   . COLONOSCOPY WITH ESOPHAGOGASTRODUODENOSCOPY (EGD)    . cyst removed from ovary  1965  . EPIDURAL BLOCK INJECTION     several times  . ganglion cyst removed from throat    . LUMBAR PERCUTANEOUS PEDICLE SCREW 1 LEVEL N/A 02/28/2017   Procedure: LUMBAR PERCUTANEOUS PEDICLE SCREW LUMBAR FOUR-FIVE;  Surgeon: Ditty, Loura Halt, MD;  Location: [facility] OR;  Service: Neurosurgery;  Laterality: N/A;    There were no vitals filed for this visit.       Subjective Assessment - 05/16/17 1012    Subjective Pt had lumbar fusion surgery on 02/28/17 and was fine for 3 days after. On the 4th day she woke up had neck pain at the base of her neck and up the right side and into her right TMJ, but any torsion or rotation can also create pain in the left side of her neck too. Pt's daughter told her that she complained of shoulder pain following the surgery, but she does not recall. Pt reports pain at end range with all neck movements and with moving neck too fast. Pt also reported dizziness with shaking her head and rolling over in bed.    Pertinent History Lumbar fusion surgery 02/27/2017, colon resection for polyp removal 04/22/17   Limitations Lifting   How long can you sit comfortably? no problems   How long can you walk comfortably? no problems   Patient Stated Goals Decrease pain, increase ROM, finding out a diagnosis.     Currently in Pain? Yes   Pain Score 4    Pain Location Neck   Pain Orientation Right;Posterior;Lateral   Pain Descriptors / Indicators Constant;Sharp   Pain  Type Acute pain   Pain Onset More than a month ago   Pain Frequency Constant   Aggravating Factors  All neck motions make it worse - 8/10   Pain Relieving Factors Ice, heat pack, rest - 3/10   Effect of Pain on Daily Activities Lifting arms over head too many times, yardwork, some yoga positions.             [facility] PT Assessment - 05/16/17 0001      Assessment   Medical Diagnosis Neck pain, Rt jaw pain   Referring Provider Eliott Nine   Onset Date/Surgical Date 03/03/17   Hand Dominance Right   Next MD Visit 05/22/2017   Prior Therapy none     Precautions   Precaution Comments Lumbar fusion     Balance Screen   Has the patient fallen in the  past 6 months No   Has the patient had a decrease in activity level because of a fear of falling?  No   Is the patient reluctant to leave their home because of a fear of falling?  No     Home Environment   Living Environment Private residence   Living Arrangements Spouse/significant other   Additional Comments Vanetta Shawl is limited because of low back. No problems with stairs.      Prior Function   Level of Independence Independent   Vocation Retired   [employer] Has grandchildren but they're older and don't need lifting   Leisure Yoga, driving, cooking, cleaning,      Observation/Other Assessments-Edema    Edema --  Pressure reported in TMJ, no visable facial swelling.      Sensation   Additional Comments UE dermatomes are intact and symmertrical     Posture/Postural Control   Posture Comments Forward head, rounded shoulders     ROM / Strength   AROM / PROM / Strength AROM     AROM   AROM Assessment Site Cervical  all movements were painful at end range   Cervical Flexion limited 50%   Cervical Extension limited 75%   Cervical - Right Side Bend limited 75%   Cervical - Left Side Bend limited 75%   Cervical - Right Rotation limited 50%   Cervical - Left Rotation limited 50%     Palpation   Palpation comment Left TMJ moves before right, right side deviation. Pain with movement, but normal ROM. Tension in upper traps. Tenderness in R upper trap and scalene area.      Special Tests    Special Tests Cervical   Cervical Tests Spurling's;Dictraction     Spurling's   Findings Negative   Side Right     Distraction Test   Findngs Negative            Objective measurements completed on examination: See above findings.                    PT Short Term Goals - 05/16/17 1139      PT SHORT TERM GOAL #1   Title Independent with initial HEP.    Time 1   Period Weeks   Status New     PT SHORT TERM GOAL #2   Title Independent with postural  corrections.    Time 1   Period Weeks   Status New           PT Long Term Goals - 05/16/17 1139      PT LONG TERM GOAL #1   Title  Increase ROM by 50% for bilateral side-bending and extension, 25% for bilateral rotation and flexion.    Time 8   Period Weeks   Status New     PT LONG TERM GOAL #2   Title Pain no greater than 3/10 with driving and/or yoga.    Time 8   Period Weeks   Status New     PT LONG TERM GOAL #3   Title Able to open/close TMJ and chew without pain.    Time 8   Period Weeks   Status New     PT LONG TERM GOAL #4   Title Report reduced dizzy spells with shaking head or rolling over in bed.    Time 8   Period Weeks   Status New                Plan - 05/16/17 1124    Clinical Impression Statement Pt has very limited ROM and a lot of tightness in her upper traps. Spurling A, Distraction, and Dermatome screens were negative for neurological involvement. Pt's pain is triggered by cervical movements. It can be on the left side too, but it is primarily on the right posteriolateral side of her neck over the upper traps and scalenes and into her right TMJ. She also reports dizziness with shaking her head and when rolling over in bed and looking at her clock. No report of dizziness with walking and states that she can still do some yoga, although this has been limited following her lumbar surgery.    History and Personal Factors relevant to plan of care: Lumbar fusion surgery - 02/27/2017   Clinical Presentation Evolving   Clinical Decision Making Low   Rehab Potential Good   PT Frequency 2x / week   PT Duration 8 weeks   PT Treatment/Interventions ADLs/Self Care Home Management;Cryotherapy;Electrical Stimulation;Moist Heat;Functional mobility training;Therapeutic activities;Therapeutic exercise;Neuromuscular re-education;Manual techniques;Patient/family education;Passive range of motion;Dry needling;Vestibular   PT Next Visit Plan Cervical strengthening,  ROM, modalities for pain. Possibly cervical STM, gaze stabilization and/or ultrasound, STM for TMJ pain.    PT Home Exercise Plan Cervical and scapular retraction, shoulder shrugs, upper trap stretch.    Consulted and Agree with Plan of Care Patient      Patient will benefit from skilled therapeutic intervention in order to improve the following deficits and impairments:  Decreased activity tolerance, Decreased range of motion, Pain, Postural dysfunction, Improper body mechanics, Increased muscle spasms, Dizziness, Impaired UE functional use  Visit Diagnosis: Cervicalgia  Other symptoms and signs involving the musculoskeletal system  Dizziness and giddiness     Problem List Patient Active Problem List   Diagnosis Date Noted  . Intractable headache 03/10/2017  . Severe Acute on Chronic Neck pain 03/10/2017  . Opioid dependence (HCC) 03/10/2017  . Gait instability 03/10/2017  . Generalized weakness 03/10/2017  . Leukocytosis 03/10/2017  . Hypothyroidism 03/10/2017  . Spondylolisthesis of lumbosacral region 02/28/2017    Ann Bell 05/16/2017, 11:57 AM  [facility] [address] [place], [place], [address] Phone: [phone]   Fax:  [phone]  Name: Ann Bell MRN: [id number] Date of Birth: 04/09/1946

## 2017-05-21 ENCOUNTER — Encounter: Payer: Self-pay | Admitting: Physical Therapy

## 2017-05-21 ENCOUNTER — Ambulatory Visit: Payer: Medicare HMO | Admitting: Physical Therapy

## 2017-05-21 DIAGNOSIS — R42 Dizziness and giddiness: Secondary | ICD-10-CM

## 2017-05-21 DIAGNOSIS — R29898 Other symptoms and signs involving the musculoskeletal system: Secondary | ICD-10-CM

## 2017-05-21 DIAGNOSIS — M542 Cervicalgia: Secondary | ICD-10-CM

## 2017-05-21 NOTE — Therapy (Signed)
[facility] [place] [address] [place], [place], [address] Phone: [phone]   Fax:  [phone]  Physical Therapy Treatment  Patient Details  Name: Ann Bell MRN: [id number] Date of Birth: 1946/05/28 Referring Provider: Eliott Nine  Encounter Date: 05/21/2017      PT End of Session - 05/21/17 1425    Visit Number 2   Date for PT Re-Evaluation 07/11/17   PT Start Time 1336   PT Stop Time 1431   PT Time Calculation (min) 55 min   Activity Tolerance Patient tolerated treatment well   Behavior During Therapy [facility] for tasks assessed/performed      Past Medical History:  Diagnosis Date  . Chronic back pain    spondylolisthesis  . Glaucoma   . Headache   . History of blood transfusion    no abnormal reaction noted  . History of colon polyps    benign  . Hypothyroidism    takes NP Thyroid  . Perforated ulcer (HCC)   . Peripheral neuropathy   . Pneumonia    hx of-yrs ago    Past Surgical History:  Procedure Laterality Date  . ABDOMINAL HYSTERECTOMY    . ANTERIOR LAT LUMBAR FUSION N/A 02/28/2017   Procedure: Lumbar four-five Transpsoas lateral interbody fusion with Lumbar four-five Percutaneous pedicle screw fixation, posterolateral arthrodesis with  Mazor ;  Surgeon: Ditty, Loura Halt, MD;  [facility];  Service: Neurosurgery;  Laterality: N/A;  . APPENDECTOMY  1965  . APPLICATION OF ROBOTIC ASSISTANCE FOR SPINAL PROCEDURE N/A 02/28/2017   Procedure: APPLICATION OF ROBOTIC ASSISTANCE FOR SPINAL PROCEDURE;  Surgeon: Ditty, Loura Halt, MD;  Location[facility] OR;  Service: Neurosurgery;  Laterality: N/A;  . cataract surgery Bilateral   . CESAREAN SECTION     x 2   . COLONOSCOPY WITH ESOPHAGOGASTRODUODENOSCOPY (EGD)    . cyst removed from ovary  1965  . EPIDURAL BLOCK INJECTION     several times  . ganglion cyst removed from throat    . LUMBAR PERCUTANEOUS PEDICLE SCREW 1 LEVEL N/A 02/28/2017   Procedure: LUMBAR PERCUTANEOUS PEDICLE SCREW LUMBAR FOUR-FIVE;  Surgeon: Ditty, Loura Halt, MD;  [facility] OR;  Service: Neurosurgery;  Laterality: N/A;    There were no vitals filed for this visit.      Subjective Assessment - 05/21/17 1341    Subjective Pt  reports no  change since evaluation.   Currently in Pain? Yes   Pain Score 5    Pain Location Neck   Pain Orientation Right;Posterior;Lateral                         [facility] Adult PT Treatment/Exercise - 05/21/17 0001      Exercises   Exercises Neck     Neck Exercises: Machines for Strengthening   UBE (Upper Arm Bike) L2 73frd/3rev     Neck Exercises: Standing   Other Standing Exercises Tband Rows & Ext 2x15 each   Other Standing Exercises Shoulder shrugs 4lb 2x10     Neck Exercises: Supine   Neck Retraction --  x2   Other Supine Exercise 3 way nexk retractions 2x10 each on physo ball.      Modalities   Modalities Moist Heat;Electrical Stimulation     Moist Heat Therapy   Number Minutes Moist Heat 15 Minutes   Moist Heat Location Cervical     Electrical Stimulation   Electrical Stimulation Location cervical spine  Electrical Stimulation Action IFC   Electrical Stimulation Parameters Sitting to pt tolerance    Electrical Stimulation Goals Pain     Manual Therapy   Manual Therapy Soft tissue mobilization;Passive ROM;Manual Traction   Manual therapy comments Some PROM take toi end range and held   Soft tissue mobilization posterior paraspinales   Passive ROM Cervical spn all dirextions   Manual Traction cervials 10'' x3                  PT Short Term Goals - 05/16/17 1139      PT SHORT TERM GOAL #1   Title Independent with initial HEP.    Time 1   Period Weeks   Status New     PT SHORT TERM GOAL #2   Title Independent with postural corrections.    Time 1   Period Weeks   Status New           PT Long Term Goals - 05/16/17 1139      PT LONG TERM GOAL #1    Title Increase ROM by 50% for bilateral side-bending and extension, 25% for bilateral rotation and flexion.    Time 8   Period Weeks   Status New     PT LONG TERM GOAL #2   Title Pain no greater than 3/10 with driving and/or yoga.    Time 8   Period Weeks   Status New     PT LONG TERM GOAL #3   Title Able to open/close TMJ and chew without pain.    Time 8   Period Weeks   Status New     PT LONG TERM GOAL #4   Title Report reduced dizzy spells with shaking head or rolling over in bed.    Time 8   Period Weeks   Status New               Plan - 05/21/17 1428    Clinical Impression Statement Pt had no issues with today's activity. Does reports that neck retraction while standing cause some TMJ pain, but she experienced no pain while performing them in supine. Pt reports some pain during MT at end range with PROM. She reports that she has the most issues when trying to look up.   Rehab Potential Good   PT Frequency 2x / week   PT Duration 8 weeks   PT Treatment/Interventions ADLs/Self Care Home Management;Cryotherapy;Electrical Stimulation;Moist Heat;Functional mobility training;Therapeutic activities;Therapeutic exercise;Neuromuscular re-education;Manual techniques;Patient/family education;Passive range of motion;Dry needling;Vestibular   PT Next Visit Plan Cervical strengthening, ROM, modalities for pain. Possibly cervical STM, gaze stabilization and/or ultrasound, STM for TMJ pain.       Patient will benefit from skilled therapeutic intervention in order to improve the following deficits and impairments:  Decreased activity tolerance, Decreased range of motion, Pain, Postural dysfunction, Improper body mechanics, Increased muscle spasms, Dizziness, Impaired UE functional use  Visit Diagnosis: Cervicalgia  Other symptoms and signs involving the musculoskeletal system  Dizziness and giddiness     Problem List Patient Active Problem List   Diagnosis Date Noted  .  Intractable headache 03/10/2017  . Severe Acute on Chronic Neck pain 03/10/2017  . Opioid dependence (HCC) 03/10/2017  . Gait instability 03/10/2017  . Generalized weakness 03/10/2017  . Leukocytosis 03/10/2017  . Hypothyroidism 03/10/2017  . Spondylolisthesis of lumbosacral region 02/28/2017    Grayce Sessions, PTA 05/21/2017, 2:33 PM  [facility] [place] [phone] [place], [place], [address] Phone: [phone]   Fax:  [phone]  Name: Ann Bell MRN: [id number] Date of Birth: October 25, 1946

## 2017-05-23 ENCOUNTER — Ambulatory Visit: Payer: Medicare HMO | Admitting: Physical Therapy

## 2017-05-23 ENCOUNTER — Encounter: Payer: Self-pay | Admitting: Physical Therapy

## 2017-05-23 DIAGNOSIS — R29898 Other symptoms and signs involving the musculoskeletal system: Secondary | ICD-10-CM

## 2017-05-23 DIAGNOSIS — R42 Dizziness and giddiness: Secondary | ICD-10-CM

## 2017-05-23 DIAGNOSIS — M542 Cervicalgia: Secondary | ICD-10-CM

## 2017-05-23 NOTE — Therapy (Signed)
[facility] [address] [place], [place], [address] Phone: [phone]   Fax:  [phone]  Physical Therapy Treatment  Patient Details  Name: Ann Bell MRN: [id number] Date of Birth: [birth year] Referring Provider: Eliott Nine  Encounter Date: 05/23/2017      PT End of Session - 05/23/17 0921    Visit Number 3   Date for PT Re-Evaluation 07/11/17   PT Start Time 0840   PT Stop Time 0940   PT Time Calculation (min) 60 min   Activity Tolerance Patient tolerated treatment well   Behavior During Therapy [facility] for tasks assessed/performed      Past Medical History:  Diagnosis Date  . Chronic back pain    spondylolisthesis  . Glaucoma   . Headache   . History of blood transfusion    no abnormal reaction noted  . History of colon polyps    benign  . Hypothyroidism    takes NP Thyroid  . Perforated ulcer (HCC)   . Peripheral neuropathy   . Pneumonia    hx of-yrs ago    Past Surgical History:  Procedure Laterality Date  . ABDOMINAL HYSTERECTOMY    . ANTERIOR LAT LUMBAR FUSION N/A 02/28/2017   Procedure: Lumbar four-five Transpsoas lateral interbody fusion with Lumbar four-five Percutaneous pedicle screw fixation, posterolateral arthrodesis with  Mazor ;  Surgeon: Ditty, Loura Halt, MD;  [facility];  Service: Neurosurgery;  Laterality: N/A;  . APPENDECTOMY  1965  . APPLICATION OF ROBOTIC ASSISTANCE FOR SPINAL PROCEDURE N/A 02/28/2017   Procedure: APPLICATION OF ROBOTIC ASSISTANCE FOR SPINAL PROCEDURE;  Surgeon: Ditty, Loura Halt, MD;  Location: [facility] OR;  Service: Neurosurgery;  Laterality: N/A;  . cataract surgery Bilateral   . CESAREAN SECTION     x 2   . COLONOSCOPY WITH ESOPHAGOGASTRODUODENOSCOPY (EGD)    . cyst removed from ovary  1965  . EPIDURAL BLOCK INJECTION     several times  . ganglion cyst removed from throat    . LUMBAR PERCUTANEOUS PEDICLE SCREW 1 LEVEL N/A 02/28/2017    Procedure: LUMBAR PERCUTANEOUS PEDICLE SCREW LUMBAR FOUR-FIVE;  Surgeon: Ditty, Loura Halt, MD;  [facility];  Service: Neurosurgery;  Laterality: N/A;    There were no vitals filed for this visit.      Subjective Assessment - 05/23/17 0841    Subjective Patient reports that she is feeling a little better, still with pain c/o pain in the occiput area   Currently in Pain? Yes   Pain Score 5    Pain Location Neck   Pain Orientation Right;Upper   Aggravating Factors  looking up, turning head   Pain Relieving Factors treatment feels good                         [facility] Adult PT Treatment/Exercise - 05/23/17 0001      Neck Exercises: Machines for Strengthening   UBE (Upper Arm Bike) L4 68frd/3rev   Cybex Row 10# 2x10   Other Machines for Strengthening lat pulls 15# 2x10     Neck Exercises: Theraband   Scapula Retraction 20 reps;Red   Shoulder Extension 20 reps;Red   Shoulder External Rotation 20 reps;Red     Neck Exercises: Standing   Other Standing Exercises Shoulder shrugs 4lb 2x10     Moist Heat Therapy   Number Minutes Moist Heat 15 Minutes   Moist Heat Location Cervical     Electrical  Stimulation   Electrical Stimulation Location cervical spine   Electrical Stimulation Action IFC   Electrical Stimulation Parameters sitting   Electrical Stimulation Goals Pain     Manual Therapy   Manual Therapy Soft tissue mobilization;Passive ROM;Manual Traction   Passive ROM PROM to end range, contract relax to gain ROM, isometrics in various positions   Manual Traction occipital release, passive stretch of the upper traps and the levator                PT Education - 05/23/17 0920    Education provided Yes   Education Details HEP with red tband for scapular stabilization   Person(s) Educated Patient   Methods Explanation;Demonstration;Handout;Tactile cues   Comprehension Verbalized understanding;Returned demonstration          PT Short Term  Goals - 05/23/17 4496      PT SHORT TERM GOAL #1   Title Independent with initial HEP.    Status Achieved           PT Long Term Goals - 05/16/17 1139      PT LONG TERM GOAL #1   Title Increase ROM by 50% for bilateral side-bending and extension, 25% for bilateral rotation and flexion.    Time 8   Period Weeks   Status New     PT LONG TERM GOAL #2   Title Pain no greater than 3/10 with driving and/or yoga.    Time 8   Period Weeks   Status New     PT LONG TERM GOAL #3   Title Able to open/close TMJ and chew without pain.    Time 8   Period Weeks   Status New     PT LONG TERM GOAL #4   Title Report reduced dizzy spells with shaking head or rolling over in bed.    Time 8   Period Weeks   Status New               Plan - 05/23/17 0926    Clinical Impression Statement Patient reports she is doing better and the more that she moves her neck the better it goes.  She is non tender and reports that the pain is "inside"   PT Next Visit Plan see how the new exerciss did   Consulted and Agree with Plan of Care Patient      Patient will benefit from skilled therapeutic intervention in order to improve the following deficits and impairments:  Decreased activity tolerance, Decreased range of motion, Pain, Postural dysfunction, Improper body mechanics, Increased muscle spasms, Dizziness, Impaired UE functional use  Visit Diagnosis: Cervicalgia  Other symptoms and signs involving the musculoskeletal system  Dizziness and giddiness     Problem List Patient Active Problem List   Diagnosis Date Noted  . Intractable headache 03/10/2017  . Severe Acute on Chronic Neck pain 03/10/2017  . Opioid dependence (HCC) 03/10/2017  . Gait instability 03/10/2017  . Generalized weakness 03/10/2017  . Leukocytosis 03/10/2017  . Hypothyroidism 03/10/2017  . Spondylolisthesis of lumbosacral region 02/28/2017    Jearld Lesch., PT 05/23/2017, 9:27 AM  [facility] [place] [facility] [address] [place], [place], [address] Phone: [phone]   Fax:  [phone]  Name: CIARRA WELBURN MRN: [id number] Date of Birth: [birth year]

## 2017-05-30 ENCOUNTER — Ambulatory Visit: Payer: Medicare HMO | Attending: Family Medicine | Admitting: Physical Therapy

## 2017-05-30 ENCOUNTER — Encounter: Payer: Self-pay | Admitting: Physical Therapy

## 2017-05-30 DIAGNOSIS — M542 Cervicalgia: Secondary | ICD-10-CM | POA: Insufficient documentation

## 2017-05-30 DIAGNOSIS — R42 Dizziness and giddiness: Secondary | ICD-10-CM

## 2017-05-30 DIAGNOSIS — R29898 Other symptoms and signs involving the musculoskeletal system: Secondary | ICD-10-CM | POA: Insufficient documentation

## 2017-05-30 NOTE — Therapy (Signed)
[place], [place], [address] Phone: [phone]   Fax:  [phone]  Physical Therapy Treatment  Patient Details  Name: Ann Bell MRN: [id number] Date of Birth: September 15, 1946 Referring Provider: Daphene Calamity  Encounter Date: 05/30/2017      PT End of Session - 05/30/17 0850    Visit Number 4   Date for PT Re-Evaluation 07/11/17   PT Start Time 0756   PT Stop Time 0855   PT Time Calculation (min) 59 min   Activity Tolerance Patient tolerated treatment well   Behavior During Therapy [facility] for tasks assessed/performed      Past Medical History:  Diagnosis Date  . Chronic back pain    spondylolisthesis  . Glaucoma   . Headache   . History of blood transfusion    no abnormal reaction noted  . History of colon polyps    benign  . Hypothyroidism    takes NP Thyroid  . Perforated ulcer ([place])   . Peripheral neuropathy   . Pneumonia    hx of-yrs ago    Past Surgical History:  Procedure Laterality Date  . ABDOMINAL HYSTERECTOMY    . ANTERIOR LAT LUMBAR FUSION N/A 02/28/2017   Procedure: Lumbar four-five Transpsoas lateral interbody fusion with Lumbar four-five Percutaneous pedicle screw fixation, posterolateral arthrodesis with  Mazor ;  Surgeon: Ditty, Kevan Ny, MD;  Location: [facility];  Service: Neurosurgery;  Laterality: N/A;  . APPENDECTOMY  1965  . APPLICATION OF ROBOTIC ASSISTANCE FOR SPINAL PROCEDURE N/A 02/28/2017   Procedure: APPLICATION OF ROBOTIC ASSISTANCE FOR SPINAL PROCEDURE;  Surgeon: Ditty, Kevan Ny, MD;  Location: [facility];  Service: Neurosurgery;  Laterality: N/A;  . cataract surgery Bilateral   . CESAREAN SECTION     x 2   . COLONOSCOPY WITH ESOPHAGOGASTRODUODENOSCOPY (EGD)    . cyst removed from ovary  1965  . EPIDURAL BLOCK INJECTION     several times  . ganglion cyst removed from throat    . LUMBAR PERCUTANEOUS PEDICLE SCREW 1 LEVEL N/A 02/28/2017   Procedure: LUMBAR PERCUTANEOUS PEDICLE SCREW LUMBAR FOUR-FIVE;  Surgeon: Ditty, Kevan Ny, MD;  Location: [facility];  Service: Neurosurgery;  Laterality: N/A;    There were no vitals filed for this visit.      Subjective Assessment - 05/30/17 0759    Subjective Pt reports that she is feeling okay today. Said that she still has the occipital pain, that it feels "deep inside, not like surface muscle." But said that STM that she did last time felt nice and she didn't have any negative reaction to it.    Patient Stated Goals Decrease pain, increase ROM, finding out a diagnosis.     Currently in Pain? Yes   Pain Score 3                          [facility] Adult PT Treatment/Exercise - 05/30/17 0001      Neck Exercises: Machines for Strengthening   UBE (Upper Arm Bike) L4 10fd/3rev   Cybex Row 10# 2x15   Other Machines for Strengthening lat pulls 15# 2x15     Neck Exercises: Theraband   Scapula Retraction 20 reps;Red   Shoulder Extension 20 reps;Red   Shoulder External Rotation 20 reps;Red     Neck Exercises: Standing   Other Standing Exercises Shoulder shrugs 4lb 1x15, 6lb 1x15     Neck Exercises: Supine  Neck Retraction 15 reps;3 secs  w. head on exercise ball   Other Supine Exercise Cervical retraction with slight rotation 20 reps both sides with head on exercise ball.      Moist Heat Therapy   Number Minutes Moist Heat 15 Minutes   Moist Heat Location Cervical     Electrical Stimulation   Electrical Stimulation Location cervical spine   Electrical Stimulation Action IFC   Electrical Stimulation Parameters supine   Electrical Stimulation Goals Pain     Manual Therapy   Manual Therapy Soft tissue mobilization   Manual therapy comments Efflurage on upper traps and cervical parspinals, ischemic pressure on upper trap trigger point, lateral vertibral low grade mobilization   Manual Traction occipital release with light distraction, passive stretch of the upper  traps and the levator                  PT Short Term Goals - 05/23/17 5726      PT SHORT TERM GOAL #1   Title Independent with initial HEP.    Status Achieved           PT Long Term Goals - 05/30/17 0906      PT LONG TERM GOAL #2   Title Pain no greater than 3/10 with driving and/or yoga.    Time 8   Period Weeks   Status Partially Met     PT LONG TERM GOAL #4   Title Report reduced dizzy spells with shaking head or rolling over in bed.    Time 8   Period Weeks   Status Partially Met               Plan - 05/30/17 0851    Clinical Impression Statement Pt did well with exercises today. Had some shoulder pain with rows and some pain below her right occiput with cervical retraction with right rotation.    PT Treatment/Interventions ADLs/Self Care Home Management;Cryotherapy;Electrical Stimulation;Moist Heat;Functional mobility training;Therapeutic activities;Therapeutic exercise;Neuromuscular re-education;Manual techniques;Patient/family education;Passive range of motion;Dry needling;Vestibular   PT Next Visit Plan Continue with strengthening, STM, modalities for pain. Assess for dry needling.    PT Home Exercise Plan Cervical and scapular retraction, shoulder shrugs, upper trap stretch.    Consulted and Agree with Plan of Care Patient      Patient will benefit from skilled therapeutic intervention in order to improve the following deficits and impairments:  Decreased activity tolerance, Decreased range of motion, Pain, Postural dysfunction, Improper body mechanics, Increased muscle spasms, Dizziness, Impaired UE functional use  Visit Diagnosis: Cervicalgia  Other symptoms and signs involving the musculoskeletal system  Dizziness and giddiness     Problem List Patient Active Problem List   Diagnosis Date Noted  . Intractable headache 03/10/2017  . Severe Acute on Chronic Neck pain 03/10/2017  . Opioid dependence ([place]) 03/10/2017  . Gait  instability 03/10/2017  . Generalized weakness 03/10/2017  . Leukocytosis 03/10/2017  . Hypothyroidism 03/10/2017  . Spondylolisthesis of lumbosacral region 02/28/2017    Lennart Pall, SPT 05/30/2017, 9:27 AM  [place], [place], [address] Phone: [phone]   Fax:  [phone]  Name: KEYON WINNICK MRN: [id number] Date of Birth: 1946/03/30

## 2017-06-06 ENCOUNTER — Encounter: Payer: Self-pay | Admitting: Physical Therapy

## 2017-06-06 ENCOUNTER — Ambulatory Visit: Payer: Medicare HMO | Admitting: Physical Therapy

## 2017-06-06 DIAGNOSIS — M542 Cervicalgia: Secondary | ICD-10-CM

## 2017-06-06 DIAGNOSIS — R42 Dizziness and giddiness: Secondary | ICD-10-CM

## 2017-06-06 DIAGNOSIS — R29898 Other symptoms and signs involving the musculoskeletal system: Secondary | ICD-10-CM

## 2017-06-06 NOTE — Patient Instructions (Signed)

## 2017-06-06 NOTE — Therapy (Signed)
[place], [place], [address] Phone: [phone]   Fax:  [phone]  Physical Therapy Treatment  Patient Details  Name: Ann Bell MRN: [id number] Date of Birth: 08-08-1946 Referring Provider: Daphene Calamity  Encounter Date: 06/06/2017      PT End of Session - 06/06/17 0929    Visit Number 5   Date for PT Re-Evaluation 07/11/17   PT Start Time 0849   PT Stop Time 0940   PT Time Calculation (min) 51 min   Activity Tolerance Patient tolerated treatment well   Behavior During Therapy [facility] for tasks assessed/performed      Past Medical History:  Diagnosis Date  . Chronic back pain    spondylolisthesis  . Glaucoma   . Headache   . History of blood transfusion    no abnormal reaction noted  . History of colon polyps    benign  . Hypothyroidism    takes NP Thyroid  . Perforated ulcer ([place])   . Peripheral neuropathy   . Pneumonia    hx of-yrs ago    Past Surgical History:  Procedure Laterality Date  . ABDOMINAL HYSTERECTOMY    . ANTERIOR LAT LUMBAR FUSION N/A 02/28/2017   Procedure: Lumbar four-five Transpsoas lateral interbody fusion with Lumbar four-five Percutaneous pedicle screw fixation, posterolateral arthrodesis with  Mazor ;  Surgeon: Ditty, Kevan Ny, MD;  Location: [place];  Service: Neurosurgery;  Laterality: N/A;  . APPENDECTOMY  1965  . APPLICATION OF ROBOTIC ASSISTANCE FOR SPINAL PROCEDURE N/A 02/28/2017   Procedure: APPLICATION OF ROBOTIC ASSISTANCE FOR SPINAL PROCEDURE;  Surgeon: Ditty, Kevan Ny, MD;  Location: [place];  Service: Neurosurgery;  Laterality: N/A;  . cataract surgery Bilateral   . CESAREAN SECTION     x 2   . COLONOSCOPY WITH ESOPHAGOGASTRODUODENOSCOPY (EGD)    . cyst removed from ovary  1965  . EPIDURAL BLOCK INJECTION     several times  . ganglion cyst removed from throat    . LUMBAR PERCUTANEOUS PEDICLE SCREW 1 LEVEL N/A 02/28/2017   Procedure: LUMBAR PERCUTANEOUS PEDICLE SCREW LUMBAR FOUR-FIVE;  Surgeon: Ditty, Kevan Ny, MD;  Location: [facility] [place];  Service: Neurosurgery;  Laterality: N/A;    There were no vitals filed for this visit.      Subjective Assessment - 06/06/17 0927    Subjective Patient reports overall feeling good, having less pain, some pain higher cervical and upper trap areas   Currently in Pain? Yes   Pain Score 3    Pain Location Neck   Pain Orientation Upper   Aggravating Factors  looking up                         [facility] Adult PT Treatment/Exercise - 06/06/17 0001      Moist Heat Therapy   Number Minutes Moist Heat 15 Minutes   Moist Heat Location Cervical     Electrical Stimulation   Electrical Stimulation Location cervical spine   Electrical Stimulation Action IFC   Electrical Stimulation Parameters prone   Electrical Stimulation Goals Pain     Manual Therapy   Manual Therapy Soft tissue mobilization   Manual therapy comments Efflurage on upper traps and cervical parspinals, ischemic pressure on upper trap trigger point, lateral vertibral low grade mobilization   Passive ROM PROM to end range, contract relax to gain ROM, isometrics in various positions  Trigger Point Dry Needling - 06/06/17 0347    Consent Given? Yes   Education Handout Provided Yes   Muscles Treated Upper Body Levator scapulae;Suboccipitals muscle group;Oblique capitus   Oblique Capitus Response Palpable increased muscle length   SubOccipitals Response Palpable increased muscle length   Levator Scapulae Response Palpable increased muscle length                PT Short Term Goals - 05/23/17 4259      PT SHORT TERM GOAL #1   Title Independent with initial HEP.    Status Achieved           PT Long Term Goals - 06/06/17 0931      PT LONG TERM GOAL #1   Title Increase ROM by 50% for bilateral side-bending and extension, 25% for bilateral rotation and flexion.     Status Partially Met     PT LONG TERM GOAL #2   Title Pain no greater than 3/10 with driving and/or yoga.    Status Partially Met     PT LONG TERM GOAL #3   Title Able to open/close TMJ and chew without pain.    Status Partially Met               Plan - 06/06/17 0930    Clinical Impression Statement some trigger points in the levator, the sub occipital and splenius were identified and we tried some dry needling in this area, no twitch response was noted but a palpable mm length differenc and she had no adverse reactions to it.   PT Next Visit Plan see if she felt like dry needling helped   Consulted and Agree with Plan of Care Patient      Patient will benefit from skilled therapeutic intervention in order to improve the following deficits and impairments:  Decreased activity tolerance, Decreased range of motion, Pain, Postural dysfunction, Improper body mechanics, Increased muscle spasms, Dizziness, Impaired UE functional use  Visit Diagnosis: Cervicalgia  Other symptoms and signs involving the musculoskeletal system  Dizziness and giddiness     Problem List Patient Active Problem List   Diagnosis Date Noted  . Intractable headache 03/10/2017  . Severe Acute on Chronic Neck pain 03/10/2017  . Opioid dependence ([place]) 03/10/2017  . Gait instability 03/10/2017  . Generalized weakness 03/10/2017  . Leukocytosis 03/10/2017  . Hypothyroidism 03/10/2017  . Spondylolisthesis of lumbosacral region 02/28/2017    Sumner Boast., PT 06/06/2017, 9:32 AM  [place], [place], [address] Phone: [phone]   Fax:  [phone]  Name: Ann Bell MRN: [id number] Date of Birth: [birth year]

## 2017-06-13 ENCOUNTER — Encounter: Payer: Medicare HMO | Admitting: Physical Therapy

## 2017-06-17 ENCOUNTER — Encounter: Payer: Self-pay | Admitting: Physical Therapy

## 2017-06-17 ENCOUNTER — Ambulatory Visit: Payer: Medicare HMO | Admitting: Physical Therapy

## 2017-06-17 DIAGNOSIS — R29898 Other symptoms and signs involving the musculoskeletal system: Secondary | ICD-10-CM

## 2017-06-17 DIAGNOSIS — M542 Cervicalgia: Secondary | ICD-10-CM | POA: Diagnosis not present

## 2017-06-17 DIAGNOSIS — R42 Dizziness and giddiness: Secondary | ICD-10-CM

## 2017-06-17 NOTE — Therapy (Signed)
[facility] [place], [place], [address] Phone: [phone]   Fax:  [phone]  Physical Therapy Treatment  Patient Details  Name: Ann Bell MRN: [id number] Date of Birth: Oct 20, 1946 Referring Provider: Daphene Calamity  Encounter Date: 06/17/2017      PT End of Session - 06/17/17 1138    Visit Number 6   Date for PT Re-Evaluation 07/11/17   PT Start Time 1058   PT Stop Time 1147   PT Time Calculation (min) 49 min   Activity Tolerance Patient tolerated treatment well   Behavior During Therapy [facility] for tasks assessed/performed      Past Medical History:  Diagnosis Date  . Chronic back pain    spondylolisthesis  . Glaucoma   . Headache   . History of blood transfusion    no abnormal reaction noted  . History of colon polyps    benign  . Hypothyroidism    takes NP Thyroid  . Perforated ulcer ([place])   . Peripheral neuropathy   . Pneumonia    hx of-yrs ago    Past Surgical History:  Procedure Laterality Date  . ABDOMINAL HYSTERECTOMY    . ANTERIOR LAT LUMBAR FUSION N/A 02/28/2017   Procedure: Lumbar four-five Transpsoas lateral interbody fusion with Lumbar four-five Percutaneous pedicle screw fixation, posterolateral arthrodesis with  Mazor ;  Surgeon: Ditty, Kevan Ny, MD;  Location: [place];  Service: Neurosurgery;  Laterality: N/A;  . APPENDECTOMY  1965  . APPLICATION OF ROBOTIC ASSISTANCE FOR SPINAL PROCEDURE N/A 02/28/2017   Procedure: APPLICATION OF ROBOTIC ASSISTANCE FOR SPINAL PROCEDURE;  Surgeon: Ditty, Kevan Ny, MD;  Location: [place];  Service: Neurosurgery;  Laterality: N/A;  . cataract surgery Bilateral   . CESAREAN SECTION     x 2   . COLONOSCOPY WITH ESOPHAGOGASTRODUODENOSCOPY (EGD)    . cyst removed from ovary  1965  . EPIDURAL BLOCK INJECTION     several times  . ganglion cyst removed from throat    . LUMBAR PERCUTANEOUS PEDICLE SCREW 1 LEVEL N/A 02/28/2017    Procedure: LUMBAR PERCUTANEOUS PEDICLE SCREW LUMBAR FOUR-FIVE;  Surgeon: Ditty, Kevan Ny, MD;  Location: [place];  Service: Neurosurgery;  Laterality: N/A;    There were no vitals filed for this visit.      Subjective Assessment - 06/17/17 1135    Subjective Patient reports that after the last treatment with the TPDN she has felt a lot better.  Less pain and better motions, less HA   Currently in Pain? Yes   Pain Score 2    Pain Location Neck   Pain Relieving Factors the treatment helped                         [facility] Adult PT Treatment/Exercise - 06/17/17 0001      Self-Care   Self-Care ADL's   ADL's went over posture and body mechanics, lifting techniques to assure that she can continue to do better and not cause increased stress on the mms that cause the trigger points     Moist Heat Therapy   Number Minutes Moist Heat 15 Minutes   Moist Heat Location Cervical     Electrical Stimulation   Electrical Stimulation Location cervical spine   Electrical Stimulation Action IFC   Electrical Stimulation Parameters sitting   Electrical Stimulation Goals Pain     Manual Therapy   Manual Therapy Soft tissue mobilization  Passive ROM PROM to end range, contract relax to gain ROM, isometrics in various positions          Trigger Point Dry Needling - 06/17/17 1137    Consent Given? Yes   Muscles Treated Upper Body Upper trapezius;Rhomboids   Upper Trapezius Response Twitch reponse elicited   Oblique Capitus Response Palpable increased muscle length   SubOccipitals Response Twitch response elicited   Levator Scapulae Response Twitch response elicited   Rhomboids Response Twitch response elicited                PT Short Term Goals - 06/17/17 1150      PT SHORT TERM GOAL #2   Title Independent with postural corrections.    Status Achieved           PT Long Term Goals - 06/17/17 1150      PT LONG TERM GOAL #1   Title Increase ROM by 50% for  bilateral side-bending and extension, 25% for bilateral rotation and flexion.    Status Achieved     PT LONG TERM GOAL #2   Title Pain no greater than 3/10 with driving and/or yoga.    Status Partially Met     PT LONG TERM GOAL #3   Title Able to open/close TMJ and chew without pain.    Status Achieved     PT LONG TERM GOAL #4   Title Report reduced dizzy spells with shaking head or rolling over in bed.    Status Achieved               Plan - 06/17/17 1139    Clinical Impression Statement Had good twitch response today, went over the prevention aspects aout good posture and body mechanis, staying flexible and having a good exercise routine.  Pt verbalized understanding   PT Next Visit Plan doing very well, may hold treatment over the next 4 weeks and see how her pain and HA's go   Consulted and Agree with Plan of Care Patient      Patient will benefit from skilled therapeutic intervention in order to improve the following deficits and impairments:  Decreased activity tolerance, Decreased range of motion, Pain, Postural dysfunction, Improper body mechanics, Increased muscle spasms, Dizziness, Impaired UE functional use  Visit Diagnosis: Cervicalgia  Other symptoms and signs involving the musculoskeletal system  Dizziness and giddiness     Problem List Patient Active Problem List   Diagnosis Date Noted  . Intractable headache 03/10/2017  . Severe Acute on Chronic Neck pain 03/10/2017  . Opioid dependence ([place]) 03/10/2017  . Gait instability 03/10/2017  . Generalized weakness 03/10/2017  . Leukocytosis 03/10/2017  . Hypothyroidism 03/10/2017  . Spondylolisthesis of lumbosacral region 02/28/2017    Sumner Boast., PT 06/17/2017, 11:52 AM  [place], [place], [address] Phone: [phone]   Fax:  [phone]  Name: Ann Bell MRN: [id number] Date of Birth:  1945/11/10

## 2017-07-09 ENCOUNTER — Ambulatory Visit: Payer: Medicare HMO | Attending: Family Medicine | Admitting: Physical Therapy

## 2017-07-09 ENCOUNTER — Encounter: Payer: Self-pay | Admitting: Physical Therapy

## 2017-07-09 DIAGNOSIS — R29898 Other symptoms and signs involving the musculoskeletal system: Secondary | ICD-10-CM | POA: Diagnosis present

## 2017-07-09 DIAGNOSIS — M542 Cervicalgia: Secondary | ICD-10-CM | POA: Diagnosis not present

## 2017-07-09 DIAGNOSIS — R42 Dizziness and giddiness: Secondary | ICD-10-CM | POA: Diagnosis present

## 2017-07-09 NOTE — Therapy (Signed)
[facility] [place], [place], [address] Phone: [phone]   Fax:  [phone]  Physical Therapy Treatment  Patient Details  Name: Ann Bell MRN: [id number] Date of Birth: [birth year] Referring Provider: Daphene Calamity  Encounter Date: 07/09/2017      PT End of Session - 07/09/17 1427    Visit Number 7   Date for PT Re-Evaluation 08/10/17   PT Start Time 1355   PT Stop Time 1444   PT Time Calculation (min) 49 min   Activity Tolerance Patient tolerated treatment well   Behavior During Therapy [facility] for tasks assessed/performed      Past Medical History:  Diagnosis Date  . Chronic back pain    spondylolisthesis  . Glaucoma   . Headache   . History of blood transfusion    no abnormal reaction noted  . History of colon polyps    benign  . Hypothyroidism    takes NP Thyroid  . Perforated ulcer ([place])   . Peripheral neuropathy   . Pneumonia    hx of-yrs ago    Past Surgical History:  Procedure Laterality Date  . ABDOMINAL HYSTERECTOMY    . ANTERIOR LAT LUMBAR FUSION N/A 02/28/2017   Procedure: Lumbar four-five Transpsoas lateral interbody fusion with Lumbar four-five Percutaneous pedicle screw fixation, posterolateral arthrodesis with  Mazor ;  Surgeon: Ditty, Kevan Ny, MD;  Location: [place];  Service: Neurosurgery;  Laterality: N/A;  . APPENDECTOMY  1965  . APPLICATION OF ROBOTIC ASSISTANCE FOR SPINAL PROCEDURE N/A 02/28/2017   Procedure: APPLICATION OF ROBOTIC ASSISTANCE FOR SPINAL PROCEDURE;  Surgeon: Ditty, Kevan Ny, MD;  Location: [place];  Service: Neurosurgery;  Laterality: N/A;  . cataract surgery Bilateral   . CESAREAN SECTION     x 2   . COLONOSCOPY WITH ESOPHAGOGASTRODUODENOSCOPY (EGD)    . cyst removed from ovary  1965  . EPIDURAL BLOCK INJECTION     several times  . ganglion cyst removed from throat    . LUMBAR PERCUTANEOUS PEDICLE SCREW 1 LEVEL N/A 02/28/2017   Procedure: LUMBAR PERCUTANEOUS PEDICLE SCREW LUMBAR FOUR-FIVE;  Surgeon: Ditty, Kevan Ny, MD;  Location: [place];  Service: Neurosurgery;  Laterality: N/A;    There were no vitals filed for this visit.      Subjective Assessment - 07/09/17 1356    Subjective Patient reports that she has been having some increased tightness over the past two weeks.  She reports that she has been doing a lot of work cleaning and a lot of reaching   Currently in Pain? Yes   Pain Score 4    Pain Location Neck   Pain Orientation Right;Left   Pain Descriptors / Indicators Aching;Tightness   Aggravating Factors  cleaning and looking up                         [facility] Adult PT Treatment/Exercise - 07/09/17 0001      Moist Heat Therapy   Number Minutes Moist Heat 15 Minutes   Moist Heat Location Cervical     Electrical Stimulation   Electrical Stimulation Location cervical spine   Electrical Stimulation Action IFC   Electrical Stimulation Parameters sitting   Electrical Stimulation Goals Pain     Manual Therapy   Manual Therapy Soft tissue mobilization   Passive ROM PROM to end range, contract relax to gain ROM, isometrics in various positions   Manual Traction occipital release  with light distraction, passive stretch of the upper traps and the levator          Trigger Point Dry Needling - 07/09/17 1359    Consent Given? Yes   Muscles Treated Upper Body Oblique capitus;Upper trapezius;Suboccipitals muscle group;Levator scapulae;Rhomboids   Upper Trapezius Response Twitch reponse elicited   Oblique Capitus Response Palpable increased muscle length   SubOccipitals Response Palpable increased muscle length   Levator Scapulae Response Twitch response elicited   Rhomboids Response Twitch response elicited                PT Short Term Goals - 06/17/17 1150      PT SHORT TERM GOAL #2   Title Independent with postural corrections.    Status Achieved           PT  Long Term Goals - 07/09/17 1430      PT LONG TERM GOAL #1   Title Increase ROM by 50% for bilateral side-bending and extension, 25% for bilateral rotation and flexion.    Status Achieved     PT LONG TERM GOAL #2   Title Pain no greater than 3/10 with driving and/or yoga.    Status Partially Met     PT LONG TERM GOAL #3   Title Able to open/close TMJ and chew without pain.    Status Achieved     PT LONG TERM GOAL #4   Title Report reduced dizzy spells with shaking head or rolling over in bed.    Status Achieved               Plan - 07/09/17 1427    Clinical Impression Statement Patient after 3 plus weeks, reports that she had been doing very well, reports that she recenlty has been doing a lot of cleaning where she is looking up and reaching up and has been having increased neck pain and tension and HA's.  She reports "immediate relief" with the dry needling today.  She has a lot of spasms and trigger points in the cervical and upper trap area   PT Next Visit Plan Again she has been doing very well but increased activity caused some increase pain, she gets great relief with dry needling   Consulted and Agree with Plan of Care Patient      Patient will benefit from skilled therapeutic intervention in order to improve the following deficits and impairments:  Decreased activity tolerance, Decreased range of motion, Pain, Postural dysfunction, Improper body mechanics, Increased muscle spasms, Dizziness, Impaired UE functional use  Visit Diagnosis: Cervicalgia - Plan: PT plan of care cert/re-cert  Other symptoms and signs involving the musculoskeletal system - Plan: PT plan of care cert/re-cert  Dizziness and giddiness - Plan: PT plan of care cert/re-cert     Problem List Patient Active Problem List   Diagnosis Date Noted  . Intractable headache 03/10/2017  . Severe Acute on Chronic Neck pain 03/10/2017  . Opioid dependence ([place]) 03/10/2017  . Gait instability 03/10/2017   . Generalized weakness 03/10/2017  . Leukocytosis 03/10/2017  . Hypothyroidism 03/10/2017  . Spondylolisthesis of lumbosacral region 02/28/2017    Sumner Boast., PT 07/09/2017, 2:32 PM  [place], [place], [address] Phone: [phone]   Fax:  [phone]  Name: Ann Bell MRN: [id number] Date of Birth: 1946-07-15

## 2017-07-09 NOTE — Therapy (Deleted)
[place], [place], [address] Phone: [phone]   Fax:  [phone]  Physical Therapy Evaluation  Patient Details  Name: Ann Bell MRN: [id number] Date of Birth: Jan 09, 1946 Referring Provider: Daphene Calamity  Encounter Date: 07/09/2017      PT End of Session - 07/09/17 1427    Visit Number 7   Date for PT Re-Evaluation 08/10/17   PT Start Time 1355   PT Stop Time 1444   PT Time Calculation (min) 49 min   Activity Tolerance Patient tolerated treatment well   Behavior During Therapy [facility] for tasks assessed/performed      Past Medical History:  Diagnosis Date  . Chronic back pain    spondylolisthesis  . Glaucoma   . Headache   . History of blood transfusion    no abnormal reaction noted  . History of colon polyps    benign  . Hypothyroidism    takes NP Thyroid  . Perforated ulcer ([place])   . Peripheral neuropathy   . Pneumonia    hx of-yrs ago    Past Surgical History:  Procedure Laterality Date  . ABDOMINAL HYSTERECTOMY    . ANTERIOR LAT LUMBAR FUSION N/A 02/28/2017   Procedure: Lumbar four-five Transpsoas lateral interbody fusion with Lumbar four-five Percutaneous pedicle screw fixation, posterolateral arthrodesis with  Mazor ;  Surgeon: Ditty, Kevan Ny, MD;  Location: [place];  Service: Neurosurgery;  Laterality: N/A;  . APPENDECTOMY  1965  . APPLICATION OF ROBOTIC ASSISTANCE FOR SPINAL PROCEDURE N/A 02/28/2017   Procedure: APPLICATION OF ROBOTIC ASSISTANCE FOR SPINAL PROCEDURE;  Surgeon: Ditty, Kevan Ny, MD;  Location: [facility];  Service: Neurosurgery;  Laterality: N/A;  . cataract surgery Bilateral   . CESAREAN SECTION     x 2   . COLONOSCOPY WITH ESOPHAGOGASTRODUODENOSCOPY (EGD)    . cyst removed from ovary  1965  . EPIDURAL BLOCK INJECTION     several times  . ganglion cyst removed from throat    . LUMBAR PERCUTANEOUS PEDICLE SCREW 1 LEVEL N/A 02/28/2017   Procedure: LUMBAR PERCUTANEOUS PEDICLE SCREW LUMBAR FOUR-FIVE;  Surgeon: Ditty, Kevan Ny, MD;  Location: [facility];  Service: Neurosurgery;  Laterality: N/A;    There were no vitals filed for this visit.       Subjective Assessment - 07/09/17 1356    Subjective Patient reports that she has been having some increased tightness over the past two weeks.  She reports that she has been doing a lot of work cleaning and a lot of reaching   Currently in Pain? Yes   Pain Score 4    Pain Location Neck   Pain Orientation Right;Left   Pain Descriptors / Indicators Aching;Tightness   Aggravating Factors  cleaning and looking up                Objective measurements completed on examination: See above findings.          [facility] Adult PT Treatment/Exercise - 07/09/17 0001      Moist Heat Therapy   Number Minutes Moist Heat 15 Minutes   Moist Heat Location Cervical     Electrical Stimulation   Electrical Stimulation Location cervical spine   Electrical Stimulation Action IFC   Electrical Stimulation Parameters sitting   Electrical Stimulation Goals Pain     Manual Therapy   Manual Therapy Soft tissue mobilization   Passive ROM PROM to end range, contract relax to gain ROM, isometrics  in various positions   Manual Traction occipital release with light distraction, passive stretch of the upper traps and the levator          Trigger Point Dry Needling - 07/09/17 1359    Consent Given? Yes   Muscles Treated Upper Body Oblique capitus;Upper trapezius;Suboccipitals muscle group;Levator scapulae;Rhomboids   Upper Trapezius Response Twitch reponse elicited   Oblique Capitus Response Palpable increased muscle length   SubOccipitals Response Palpable increased muscle length   Levator Scapulae Response Twitch response elicited   Rhomboids Response Twitch response elicited                PT Short Term Goals - 06/17/17 1150      PT SHORT TERM GOAL #2   Title  Independent with postural corrections.    Status Achieved           PT Long Term Goals - 07/09/17 1430      PT LONG TERM GOAL #1   Title Increase ROM by 50% for bilateral side-bending and extension, 25% for bilateral rotation and flexion.    Status Achieved     PT LONG TERM GOAL #2   Title Pain no greater than 3/10 with driving and/or yoga.    Status Partially Met     PT LONG TERM GOAL #3   Title Able to open/close TMJ and chew without pain.    Status Achieved     PT LONG TERM GOAL #4   Title Report reduced dizzy spells with shaking head or rolling over in bed.    Status Achieved                Plan - 07/09/17 1427    Clinical Impression Statement Patient after 3 plus weeks, reports that she had been doing very well, reports that she recenlty has been doing a lot of cleaning where she is looking up and reaching up and has been having increased neck pain and tension and HA's.  She reports "immediate relief" with the dry needling today.  She has a lot of spasms and trigger points in the cervical and upper trap area   PT Next Visit Plan Again she has been doing very well but increased activity caused some increase pain, she gets great relief with dry needling   Consulted and Agree with Plan of Care Patient      Patient will benefit from skilled therapeutic intervention in order to improve the following deficits and impairments:  Decreased activity tolerance, Decreased range of motion, Pain, Postural dysfunction, Improper body mechanics, Increased muscle spasms, Dizziness, Impaired UE functional use  Visit Diagnosis: Cervicalgia - Plan: PT plan of care cert/re-cert  Other symptoms and signs involving the musculoskeletal system - Plan: PT plan of care cert/re-cert  Dizziness and giddiness - Plan: PT plan of care cert/re-cert     Problem List Patient Active Problem List   Diagnosis Date Noted  . Intractable headache 03/10/2017  . Severe Acute on Chronic Neck pain  03/10/2017  . Opioid dependence ([place]) 03/10/2017  . Gait instability 03/10/2017  . Generalized weakness 03/10/2017  . Leukocytosis 03/10/2017  . Hypothyroidism 03/10/2017  . Spondylolisthesis of lumbosacral region 02/28/2017    Sumner Boast 07/09/2017, 2:32 PM  [place], [place], [address] Phone: [phone]   Fax:  [phone]  Name: Ann Bell MRN: [id number] Date of Birth: 1946/01/28

## 2017-08-04 ENCOUNTER — Ambulatory Visit: Payer: Medicare HMO | Attending: Family Medicine | Admitting: Physical Therapy

## 2017-08-04 ENCOUNTER — Encounter: Payer: Self-pay | Admitting: Physical Therapy

## 2017-08-04 DIAGNOSIS — M542 Cervicalgia: Secondary | ICD-10-CM | POA: Insufficient documentation

## 2017-08-04 DIAGNOSIS — R42 Dizziness and giddiness: Secondary | ICD-10-CM | POA: Insufficient documentation

## 2017-08-04 DIAGNOSIS — R29898 Other symptoms and signs involving the musculoskeletal system: Secondary | ICD-10-CM | POA: Diagnosis present

## 2017-08-04 NOTE — Therapy (Signed)
[place], [place], [address] Phone: [phone]   Fax:  [phone]  Physical Therapy Treatment  Patient Details  Name: Ann Bell MRN: [id number] Date of Birth: 05-Dec-1945 Referring Provider: Daphene Calamity  Encounter Date: 08/04/2017      PT End of Session - 08/04/17 1045    Visit Number 8   Date for PT Re-Evaluation 08/10/17   PT Start Time 0845   PT Stop Time 0935   PT Time Calculation (min) 50 min   Activity Tolerance Patient tolerated treatment well   Behavior During Therapy [facility] for tasks assessed/performed      Past Medical History:  Diagnosis Date  . Chronic back pain    spondylolisthesis  . Glaucoma   . Headache   . History of blood transfusion    no abnormal reaction noted  . History of colon polyps    benign  . Hypothyroidism    takes NP Thyroid  . Perforated ulcer ([place])   . Peripheral neuropathy   . Pneumonia    hx of-yrs ago    Past Surgical History:  Procedure Laterality Date  . ABDOMINAL HYSTERECTOMY    . ANTERIOR LAT LUMBAR FUSION N/A 02/28/2017   Procedure: Lumbar four-five Transpsoas lateral interbody fusion with Lumbar four-five Percutaneous pedicle screw fixation, posterolateral arthrodesis with  Mazor ;  Surgeon: Ditty, Kevan Ny, MD;  Location: [place];  Service: Neurosurgery;  Laterality: N/A;  . APPENDECTOMY  1965  . APPLICATION OF ROBOTIC ASSISTANCE FOR SPINAL PROCEDURE N/A 02/28/2017   Procedure: APPLICATION OF ROBOTIC ASSISTANCE FOR SPINAL PROCEDURE;  Surgeon: Ditty, Kevan Ny, MD;  Location: [place];  Service: Neurosurgery;  Laterality: N/A;  . cataract surgery Bilateral   . CESAREAN SECTION     x 2   . COLONOSCOPY WITH ESOPHAGOGASTRODUODENOSCOPY (EGD)    . cyst removed from ovary  1965  . EPIDURAL BLOCK INJECTION     several times  . ganglion cyst removed from throat    . LUMBAR PERCUTANEOUS PEDICLE SCREW 1 LEVEL N/A 02/28/2017    Procedure: LUMBAR PERCUTANEOUS PEDICLE SCREW LUMBAR FOUR-FIVE;  Surgeon: Ditty, Kevan Ny, MD;  Location: [place];  Service: Neurosurgery;  Laterality: N/A;    There were no vitals filed for this visit.      Subjective Assessment - 08/04/17 1024    Subjective Patient reports that she was doing very well and felt like her neck was mcuh better, she reports getting a massage last week and having increased neck pain and some vertigo.  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 Yes   Pain Score 4    Pain Location Neck   Aggravating Factors  cleaning                         [facility] Adult PT Treatment/Exercise - 08/04/17 0001      Moist Heat Therapy   Number Minutes Moist Heat 15 Minutes   Moist Heat Location Cervical     Electrical Stimulation   Electrical Stimulation Location cervical spine   Electrical Stimulation Action IFC   Electrical Stimulation Parameters sitting   Electrical Stimulation Goals Pain     Manual Therapy   Manual Therapy Soft tissue mobilization   Passive ROM PROM to end range, contract relax to gain ROM, isometrics in various positions   Manual Traction occipital release  with light distraction, passive stretch of the upper traps and the levator          Trigger Point Dry Needling - 08/04/17 1045    Consent Given? Yes   Upper Trapezius Response Twitch reponse elicited   Oblique Capitus Response Twitch response elicited   SubOccipitals Response Twitch response elicited   Levator Scapulae Response Twitch response elicited   Rhomboids Response Twitch response elicited                PT Short Term Goals - 06/17/17 1150      PT SHORT TERM GOAL #2   Title Independent with postural corrections.    Status Achieved           PT Long Term Goals - 08/04/17 1047      PT LONG TERM GOAL #2   Title Pain no greater than 3/10 with driving and/or yoga.    Status Partially Met                Plan - 08/04/17 1046    Clinical Impression Statement Patient had been doing very well and reported that she did not think she needed to come back until she had a massage, she reports that since the massage she has had increased neck pain and even c/o dizziness and vertigo, reports that she has had vertigo int he past   PT Next Visit Plan May need to assess vertigo, will need renewal next visit   Consulted and Agree with Plan of Care Patient      Patient will benefit from skilled therapeutic intervention in order to improve the following deficits and impairments:  Decreased activity tolerance, Decreased range of motion, Pain, Postural dysfunction, Improper body mechanics, Increased muscle spasms, Dizziness, Impaired UE functional use  Visit Diagnosis: Cervicalgia  Dizziness and giddiness     Problem List Patient Active Problem List   Diagnosis Date Noted  . Intractable headache 03/10/2017  . Severe Acute on Chronic Neck pain 03/10/2017  . Opioid dependence ([place]) 03/10/2017  . Gait instability 03/10/2017  . Generalized weakness 03/10/2017  . Leukocytosis 03/10/2017  . Hypothyroidism 03/10/2017  . Spondylolisthesis of lumbosacral region 02/28/2017    Sumner Boast., PT 08/04/2017, 10:48 AM  [place], [place], [address] Phone: [phone]   Fax:  [phone]  Name: ADYLINE HUBERTY MRN: [id number] Date of Birth: [birth year]

## 2017-08-13 ENCOUNTER — Ambulatory Visit: Payer: Medicare HMO | Admitting: Physical Therapy

## 2017-08-13 ENCOUNTER — Encounter: Payer: Self-pay | Admitting: Physical Therapy

## 2017-08-13 DIAGNOSIS — R29898 Other symptoms and signs involving the musculoskeletal system: Secondary | ICD-10-CM

## 2017-08-13 DIAGNOSIS — R42 Dizziness and giddiness: Secondary | ICD-10-CM

## 2017-08-13 DIAGNOSIS — M542 Cervicalgia: Secondary | ICD-10-CM

## 2017-08-13 NOTE — Therapy (Signed)
[place], [place], [address] Phone: [phone]   Fax:  [phone]  Physical Therapy Treatment  Patient Details  Name: Ann Bell MRN: [id number] Date of Birth: 07/18/1946 Referring Provider: Daphene Calamity  Encounter Date: 08/13/2017      PT End of Session - 08/13/17 1149    Visit Number 9   Date for PT Re-Evaluation 09/10/17   PT Start Time 1100   PT Stop Time 1150   PT Time Calculation (min) 50 min   Activity Tolerance Patient tolerated treatment well   Behavior During Therapy [facility] for tasks assessed/performed      Past Medical History:  Diagnosis Date  . Chronic back pain    spondylolisthesis  . Glaucoma   . Headache   . History of blood transfusion    no abnormal reaction noted  . History of colon polyps    benign  . Hypothyroidism    takes NP Thyroid  . Perforated ulcer ([place])   . Peripheral neuropathy   . Pneumonia    hx of-yrs ago    Past Surgical History:  Procedure Laterality Date  . ABDOMINAL HYSTERECTOMY    . ANTERIOR LAT LUMBAR FUSION N/A 02/28/2017   Procedure: Lumbar four-five Transpsoas lateral interbody fusion with Lumbar four-five Percutaneous pedicle screw fixation, posterolateral arthrodesis with  Mazor ;  Surgeon: Ditty, Kevan Ny, MD;  Location: [place];  Service: Neurosurgery;  Laterality: N/A;  . APPENDECTOMY  1965  . APPLICATION OF ROBOTIC ASSISTANCE FOR SPINAL PROCEDURE N/A 02/28/2017   Procedure: APPLICATION OF ROBOTIC ASSISTANCE FOR SPINAL PROCEDURE;  Surgeon: Ditty, Kevan Ny, MD;  Location: [place];  Service: Neurosurgery;  Laterality: N/A;  . cataract surgery Bilateral   . CESAREAN SECTION     x 2   . COLONOSCOPY WITH ESOPHAGOGASTRODUODENOSCOPY (EGD)    . cyst removed from ovary  1965  . EPIDURAL BLOCK INJECTION     several times  . ganglion cyst removed from throat    . LUMBAR PERCUTANEOUS PEDICLE SCREW 1 LEVEL N/A 02/28/2017    Procedure: LUMBAR PERCUTANEOUS PEDICLE SCREW LUMBAR FOUR-FIVE;  Surgeon: Ditty, Kevan Ny, MD;  Location: [place];  Service: Neurosurgery;  Laterality: N/A;    There were no vitals filed for this visit.      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  She reports that she feel the dry needling really has helped her HA's and her jaw pain.  Reports some vertigo recently   Currently in Pain? Yes   Pain Score 4    Pain Location Neck   Pain Orientation Left   Aggravating Factors  stress   Pain Relieving Factors treatment                         [facility] Adult PT Treatment/Exercise - 08/13/17 0001      Moist Heat Therapy   Number Minutes Moist Heat 15 Minutes   Moist Heat Location Cervical     Electrical Stimulation   Electrical Stimulation Location cervical area and left upper trap   Electrical Stimulation Action IFC   Electrical Stimulation Parameters sitting   Electrical Stimulation Goals Pain     Manual Therapy   Manual Therapy Soft tissue mobilization   Passive ROM PROM  to end range, contract relax to gain ROM, isometrics in various positions   Manual Traction occipital release with light distraction, passive stretch of the upper traps and the levator          Trigger Point Dry Needling - 08/13/17 1147    Consent Given? Yes   Upper Trapezius Response Twitch reponse elicited   Oblique Capitus Response Twitch response elicited   SubOccipitals Response Twitch response elicited   Levator Scapulae Response Twitch response elicited   Rhomboids Response Twitch response elicited                PT Short Term Goals - 06/17/17 1150      PT SHORT TERM GOAL #2   Title Independent with postural corrections.    Status Achieved           PT Long Term Goals - 08/13/17 1151       PT LONG TERM GOAL #1   Title Increase ROM by 50% for bilateral side-bending and extension, 25% for bilateral rotation and flexion.    Status Achieved     PT LONG TERM GOAL #2   Title Pain no greater than 3/10 with driving and/or yoga.    Status Partially Met     PT LONG TERM GOAL #3   Title Able to open/close TMJ and chew without pain.    Status Achieved     PT LONG TERM GOAL #4   Title Report reduced dizzy spells with shaking head or rolling over in bed.    Status Achieved               Plan - 08/13/17 1149    Clinical Impression Statement Patient overall has responded well to dry needling treatment, reports less pain overall, less HA and no jaw pain.  She has had some vertigo as well and has had to do a lot of cleaning and some increased stress iwth hosting 8 people for furniture market in her house.   PT Frequency 1x / week   PT Duration 4 weeks   PT Next Visit Plan will continue to address spasms, pain and vertigo   Consulted and Agree with Plan of Care Patient      Patient will benefit from skilled therapeutic intervention in order to improve the following deficits and impairments:  Decreased activity tolerance, Decreased range of motion, Pain, Postural dysfunction, Improper body mechanics, Increased muscle spasms, Dizziness, Impaired UE functional use  Visit Diagnosis: Cervicalgia - Plan: PT plan of care cert/re-cert  Dizziness and giddiness - Plan: PT plan of care cert/re-cert  Other symptoms and signs involving the musculoskeletal system - Plan: PT plan of care cert/re-cert     Problem List Patient Active Problem List   Diagnosis Date Noted  . Intractable headache 03/10/2017  . Severe Acute on Chronic Neck pain 03/10/2017  . Opioid dependence ([place]) 03/10/2017  . Gait instability 03/10/2017  . Generalized weakness 03/10/2017  . Leukocytosis 03/10/2017  . Hypothyroidism 03/10/2017  . Spondylolisthesis of lumbosacral region 02/28/2017     Sumner Boast., PT 08/13/2017, 11:53 AM  [place], [place], [address] Phone: [phone]   Fax:  [phone]  Name: Ann Bell MRN: [id number] Date of Birth: 04-Mar-1946

## 2017-08-13 NOTE — Patient Instructions (Signed)
Did left epley maneuver with her, mild symptoms resolved in 15 seconds.  Negative Dix-Halpike on the right

## 2017-09-05 ENCOUNTER — Ambulatory Visit: Payer: Medicare HMO | Admitting: Physical Therapy

## 2018-03-30 DIAGNOSIS — K219 Gastro-esophageal reflux disease without esophagitis: Secondary | ICD-10-CM | POA: Insufficient documentation

## 2018-03-30 DIAGNOSIS — H409 Unspecified glaucoma: Secondary | ICD-10-CM | POA: Insufficient documentation

## 2018-04-01 ENCOUNTER — Other Ambulatory Visit: Payer: Self-pay | Admitting: Orthopedic Surgery

## 2018-04-03 ENCOUNTER — Encounter (HOSPITAL_COMMUNITY): Payer: Self-pay

## 2018-04-03 ENCOUNTER — Encounter (HOSPITAL_COMMUNITY)
Admission: RE | Admit: 2018-04-03 | Discharge: 2018-04-03 | Disposition: A | Discharge status: Home / Self Care | Payer: Medicare HMO | Source: Ambulatory Visit | Attending: Orthopedic Surgery | Admitting: Orthopedic Surgery

## 2018-04-03 DIAGNOSIS — Z01812 Encounter for preprocedural laboratory examination: Secondary | ICD-10-CM | POA: Insufficient documentation

## 2018-04-03 HISTORY — DX: Anemia, unspecified: D64.9

## 2018-04-03 HISTORY — DX: Unspecified osteoarthritis, unspecified site: M19.90

## 2018-04-03 LAB — CBC
HEMATOCRIT: 43.4 % (ref 36.0–46.0)
HEMOGLOBIN: 14.1 g/dL (ref 12.0–15.0)
MCH: 29.2 pg (ref 26.0–34.0)
MCHC: 32.5 g/dL (ref 30.0–36.0)
MCV: 89.9 fL (ref 78.0–100.0)
Platelets: 317 10*3/uL (ref 150–400)
RBC: 4.83 MIL/uL (ref 3.87–5.11)
RDW: 13.8 % (ref 11.5–15.5)
WBC: 10.9 10*3/uL — ABNORMAL HIGH (ref 4.0–10.5)

## 2018-04-03 LAB — BASIC METABOLIC PANEL
Anion gap: 8 (ref 5–15)
BUN: 12 mg/dL (ref 6–20)
CALCIUM: 9.6 mg/dL (ref 8.9–10.3)
CO2: 28 mmol/L (ref 22–32)
Chloride: 105 mmol/L (ref 101–111)
Creatinine, Ser: 0.66 mg/dL (ref 0.44–1.00)
GFR calc Af Amer: >60 mL/min (ref >60)
GLUCOSE: 105 mg/dL — AB (ref 65–99)
Potassium: 4 mmol/L (ref 3.5–5.1)
Sodium: 141 mmol/L (ref 135–145)

## 2018-04-03 LAB — SURGICAL PCR SCREEN
MRSA, PCR: NEGATIVE
STAPHYLOCOCCUS AUREUS: NEGATIVE

## 2018-04-03 NOTE — Pre-Procedure Instructions (Signed)
CRISTINE DAW  04/03/2018      [facility] [address] [place], [place] - [address] [place] [address] Phone: [phone] Fax: [phone]   Your procedure is scheduled on 04-14-2018  Tuesday .  Report to [facility] Admitting at 11:30 A.M.   Call this number if you have problems the morning of surgery:  [phone]   Remember: NO FOOD OR ANYTHING TO DRINK AFER MIDNIGHT  .                         Take these medicines the morning of surgery with A SIP OF WATER   Pantoprazole(Protonix) Thyroid( NP Thyroid)  STOP TAKING ANY ASPIRIN(UNLESS OTHERWISE INSTRUCTED BY YOUR SURGEON),ANTIINFLAMATORIES (IBUPROFEN,ALEVE,MOTRIN,ADVIL,GOODY'S POWDERS),HERBAL SUPPLEMENTS,FISH OIL,AND VITAMINS 5-7 DAYS PRIOR TO SURGERY    Do not wear jewelry, make-up or nail polish.  Do not wear lotions, powders, or perfumes, or deodorant.  Do not shave 48 hours prior to surgery.  Men may shave face and neck.  Do not bring valuables to the hospital.  [facility] is not responsible for any belongings or valuables.  Contacts, dentures or bridgework may not be worn into surgery.  Leave your suitcase in the car.  After surgery it may be brought to your room.  For patients admitted to the hospital, discharge time will be determined by your treatment team.  Patients discharged the day of surgery will not be allowed to drive home.      [facility] - Preparing for Surgery  Before surgery, you can play an important role.  Because skin is not sterile, your skin needs to be as free of germs as possible.  You can reduce the number of germs on you skin by washing with CHG (chlorahexidine gluconate) soap before surgery.  CHG is an antiseptic cleaner which kills germs and bonds with the skin to continue killing germs even after washing.  Oral Hygiene is also important in reducing the risk of infection.  Remember to brush your teeth with your regular toothpaste the  morning of surgery.  Please DO NOT use if you have an allergy to CHG or antibacterial soaps.  If your skin becomes reddened/irritated stop using the CHG and inform your nurse when you arrive at Short Stay.  Do not shave (including legs and underarms) for at least 48 hours prior to the first CHG shower.  You may shave your face.  Please follow these instructions carefully:   1.  Shower with CHG Soap the night before surgery and the morning of Surgery.  2.  If you choose to wash your hair, wash your hair first as usual with your normal shampoo.  3.  After you shampoo, rinse your hair and body thoroughly to remove the shampoo. 4.  Use CHG as you would any other liquid soap.  You can apply chg directly to the skin and wash gently with a      scrungie or washcloth.           5.  Apply the CHG Soap to your body ONLY FROM THE NECK DOWN.   Do not use on open wounds or open sores. Avoid contact with your eyes, ears, mouth and genitals (private parts).  Wash genitals (private parts) with your normal soap.  6.  Wash thoroughly, paying special attention to the area where your surgery will be performed.  7.  Thoroughly rinse your body with warm water  from the neck down.  8.  DO NOT shower/wash with your normal soap after using and rinsing off the CHG Soap.  9.  Pat yourself dry with a clean towel.            10.  Wear clean pajamas.            11.  Place clean sheets on your bed the night of your first shower and do not sleep with pets.  Day of Surgery  Do not apply any lotions/deoderants the morning of surgery.   Please wear clean clothes to the hospital/surgery center. Remember to brush your teeth with toothpaste.    Please read over the following fact sheets that you were given.

## 2018-04-03 NOTE — Progress Notes (Signed)
PCP Eliott Nine   Denies any cardiac history   Denies any cardiac testing

## 2018-04-10 ENCOUNTER — Telehealth: Payer: Self-pay

## 2018-04-10 DIAGNOSIS — K269 Duodenal ulcer, unspecified as acute or chronic, without hemorrhage or perforation: Secondary | ICD-10-CM | POA: Insufficient documentation

## 2018-04-10 NOTE — Telephone Encounter (Signed)
-----   Message from [place] sent at 04/10/2018  9:00 AM EDT ----- Regarding: New pt to est Contact: [phone] Pt would like to know if Dr Drue Novel can take her and spouse as new pt. Pt was recommended by Carolyne Littles (a pt of [facility]) Please advise.

## 2018-04-10 NOTE — Telephone Encounter (Signed)
Please schedule a visit at her earliest convenience

## 2018-04-10 NOTE — Telephone Encounter (Signed)
Called pt and LVM regarding their request to establish care with Dr. Drue Novel.  Informed pt that the request was approved and to call to schedule a NP appt.

## 2018-04-13 NOTE — Telephone Encounter (Signed)
Appt scheduled 05/13/2018.

## 2018-04-14 ENCOUNTER — Observation Stay (HOSPITAL_COMMUNITY): Payer: Medicare HMO

## 2018-04-14 ENCOUNTER — Inpatient Hospital Stay (HOSPITAL_COMMUNITY): Payer: Medicare HMO | Admitting: Anesthesiology

## 2018-04-14 ENCOUNTER — Encounter (HOSPITAL_COMMUNITY): Payer: Self-pay | Admitting: *Deleted

## 2018-04-14 ENCOUNTER — Observation Stay (HOSPITAL_COMMUNITY)
Admission: RE | Admit: 2018-04-14 | Discharge: 2018-04-15 | Disposition: A | Discharge status: Home / Self Care | Payer: Medicare HMO | Source: Ambulatory Visit | Attending: Orthopedic Surgery | Admitting: Orthopedic Surgery

## 2018-04-14 ENCOUNTER — Encounter (HOSPITAL_COMMUNITY)
Admission: RE | Disposition: A | Discharge status: Home / Self Care | Payer: Self-pay | Source: Ambulatory Visit | Attending: Orthopedic Surgery

## 2018-04-14 DIAGNOSIS — E039 Hypothyroidism, unspecified: Secondary | ICD-10-CM | POA: Diagnosis not present

## 2018-04-14 DIAGNOSIS — Z79890 Hormone replacement therapy (postmenopausal): Secondary | ICD-10-CM | POA: Insufficient documentation

## 2018-04-14 DIAGNOSIS — M1712 Unilateral primary osteoarthritis, left knee: Secondary | ICD-10-CM | POA: Diagnosis not present

## 2018-04-14 DIAGNOSIS — G629 Polyneuropathy, unspecified: Secondary | ICD-10-CM | POA: Diagnosis not present

## 2018-04-14 DIAGNOSIS — Z96652 Presence of left artificial knee joint: Secondary | ICD-10-CM

## 2018-04-14 DIAGNOSIS — Z87891 Personal history of nicotine dependence: Secondary | ICD-10-CM | POA: Insufficient documentation

## 2018-04-14 DIAGNOSIS — Z96659 Presence of unspecified artificial knee joint: Secondary | ICD-10-CM

## 2018-04-14 DIAGNOSIS — K219 Gastro-esophageal reflux disease without esophagitis: Secondary | ICD-10-CM | POA: Insufficient documentation

## 2018-04-14 DIAGNOSIS — Z79899 Other long term (current) drug therapy: Secondary | ICD-10-CM | POA: Diagnosis not present

## 2018-04-14 DIAGNOSIS — H409 Unspecified glaucoma: Secondary | ICD-10-CM | POA: Insufficient documentation

## 2018-04-14 HISTORY — PX: PARTIAL KNEE ARTHROPLASTY: SHX2174

## 2018-04-14 HISTORY — PX: REPLACEMENT UNICONDYLAR JOINT KNEE: SUR1227

## 2018-04-14 HISTORY — DX: Unilateral primary osteoarthritis, left knee: M17.12

## 2018-04-14 SURGERY — ARTHROPLASTY, KNEE, UNICOMPARTMENTAL
Anesthesia: Monitor Anesthesia Care | Site: Knee | Laterality: Left

## 2018-04-14 MED ORDER — ALUM & MAG HYDROXIDE-SIMETH 200-200-20 MG/5ML PO SUSP: 30.0000 mL | ORAL | Status: DC | PRN

## 2018-04-14 MED ORDER — TRANEXAMIC ACID 1000 MG/10ML IV SOLN
1000.0000 mg | Freq: Once | INTRAVENOUS | Status: AC
  Administered 2018-04-14: 1000 mg via INTRAVENOUS
  Filled 2018-04-14: qty 10

## 2018-04-14 MED ORDER — HYDROCODONE-ACETAMINOPHEN 5-325 MG PO TABS
1.0000 | ORAL_TABLET | ORAL | Status: DC | PRN
  Administered 2018-04-15: 2 via ORAL
  Filled 2018-04-14: qty 2

## 2018-04-14 MED ORDER — OXYCODONE HCL 5 MG PO TABS: 5.0000 mg | ORAL_TABLET | Freq: Once | ORAL | Status: DC | PRN

## 2018-04-14 MED ORDER — METOCLOPRAMIDE HCL 5 MG PO TABS: 5.0000 mg | ORAL_TABLET | Freq: Three times a day (TID) | ORAL | Status: DC | PRN

## 2018-04-14 MED ORDER — PHENOL 1.4 % MT LIQD: 1.0000 | OROMUCOSAL | Status: DC | PRN

## 2018-04-14 MED ORDER — PROPOFOL 500 MG/50ML IV EMUL
INTRAVENOUS | Status: DC | PRN
  Administered 2018-04-14: 75 ug/kg/min via INTRAVENOUS

## 2018-04-14 MED ORDER — BACLOFEN 10 MG PO TABS: 10.0000 mg | ORAL_TABLET | Freq: Three times a day (TID) | ORAL | 0 refills | Status: DC

## 2018-04-14 MED ORDER — ADULT MULTIVITAMIN W/MINERALS CH
1.0000 | ORAL_TABLET | Freq: Every day | ORAL | Status: DC
  Administered 2018-04-15: 1 via ORAL
  Filled 2018-04-14: qty 1

## 2018-04-14 MED ORDER — LACTATED RINGERS IV SOLN
INTRAVENOUS | Status: DC
  Administered 2018-04-14 (×2): via INTRAVENOUS

## 2018-04-14 MED ORDER — ONDANSETRON HCL 4 MG/2ML IJ SOLN
INTRAMUSCULAR | Status: DC | PRN
  Administered 2018-04-14: 4 mg via INTRAVENOUS

## 2018-04-14 MED ORDER — GLYCOPYRROLATE 0.2 MG/ML IJ SOLN
INTRAMUSCULAR | Status: DC | PRN
  Administered 2018-04-14: 0.2 mg via INTRAVENOUS

## 2018-04-14 MED ORDER — METHOCARBAMOL 500 MG PO TABS
500.0000 mg | ORAL_TABLET | Freq: Four times a day (QID) | ORAL | Status: DC | PRN
  Administered 2018-04-15 (×2): 500 mg via ORAL
  Filled 2018-04-14 (×2): qty 1

## 2018-04-14 MED ORDER — MENTHOL 3 MG MT LOZG: 1.0000 | LOZENGE | OROMUCOSAL | Status: DC | PRN

## 2018-04-14 MED ORDER — THYROID 60 MG PO TABS
60.0000 mg | ORAL_TABLET | Freq: Every day | ORAL | Status: DC
  Administered 2018-04-15: 60 mg via ORAL
  Filled 2018-04-14: qty 1

## 2018-04-14 MED ORDER — TRAMADOL HCL 50 MG PO TABS: 50.0000 mg | ORAL_TABLET | Freq: Four times a day (QID) | ORAL | 0 refills | Status: DC | PRN

## 2018-04-14 MED ORDER — ROPIVACAINE HCL 7.5 MG/ML IJ SOLN
INTRAMUSCULAR | Status: DC | PRN
  Administered 2018-04-14: 20 mL via PERINEURAL

## 2018-04-14 MED ORDER — POTASSIUM CHLORIDE IN NACL 20-0.45 MEQ/L-% IV SOLN
INTRAVENOUS | Status: DC
  Administered 2018-04-14: 22:00:00 via INTRAVENOUS
  Filled 2018-04-14 (×2): qty 1000

## 2018-04-14 MED ORDER — KETOROLAC TROMETHAMINE 30 MG/ML IJ SOLN
INTRAMUSCULAR | Status: AC
  Filled 2018-04-14: qty 1

## 2018-04-14 MED ORDER — SODIUM CHLORIDE 0.9 % IR SOLN
Status: DC | PRN
  Administered 2018-04-14: 1000 mL

## 2018-04-14 MED ORDER — CEFAZOLIN SODIUM-DEXTROSE 2-4 GM/100ML-% IV SOLN
2.0000 g | INTRAVENOUS | Status: AC
  Administered 2018-04-14: 2 g via INTRAVENOUS

## 2018-04-14 MED ORDER — KETOROLAC TROMETHAMINE 15 MG/ML IJ SOLN
7.5000 mg | Freq: Four times a day (QID) | INTRAMUSCULAR | Status: DC
  Administered 2018-04-14 – 2018-04-15 (×3): 7.5 mg via INTRAVENOUS
  Filled 2018-04-14 (×3): qty 1

## 2018-04-14 MED ORDER — ONDANSETRON HCL 4 MG PO TABS: 4.0000 mg | ORAL_TABLET | Freq: Three times a day (TID) | ORAL | 0 refills | Status: DC | PRN

## 2018-04-14 MED ORDER — FENTANYL CITRATE (PF) 100 MCG/2ML IJ SOLN: 50.0000 ug | Freq: Once | INTRAMUSCULAR | Status: DC

## 2018-04-14 MED ORDER — LATANOPROSTENE BUNOD 0.024 % OP SOLN: 1.0000 [drp] | Freq: Every day | OPHTHALMIC | Status: DC

## 2018-04-14 MED ORDER — ONDANSETRON HCL 4 MG PO TABS: 4.0000 mg | ORAL_TABLET | Freq: Four times a day (QID) | ORAL | Status: DC | PRN

## 2018-04-14 MED ORDER — PANTOPRAZOLE SODIUM 40 MG PO TBEC
40.0000 mg | DELAYED_RELEASE_TABLET | ORAL | Status: DC
  Administered 2018-04-15: 40 mg via ORAL
  Filled 2018-04-14: qty 1

## 2018-04-14 MED ORDER — BUPIVACAINE IN DEXTROSE 0.75-8.25 % IT SOLN
INTRATHECAL | Status: DC | PRN
Start: 2018-04-14 — End: 2018-04-14
  Administered 2018-04-14: 1.8 mL via INTRATHECAL

## 2018-04-14 MED ORDER — FENTANYL CITRATE (PF) 100 MCG/2ML IJ SOLN: 25.0000 ug | INTRAMUSCULAR | Status: DC | PRN

## 2018-04-14 MED ORDER — ACETAMINOPHEN 325 MG PO TABS: 325.0000 mg | ORAL_TABLET | Freq: Four times a day (QID) | ORAL | Status: DC | PRN

## 2018-04-14 MED ORDER — MIDAZOLAM HCL 2 MG/2ML IJ SOLN: 1.0000 mg | Freq: Once | INTRAMUSCULAR | Status: DC

## 2018-04-14 MED ORDER — METOCLOPRAMIDE HCL 5 MG/ML IJ SOLN: 5.0000 mg | Freq: Three times a day (TID) | INTRAMUSCULAR | Status: DC | PRN

## 2018-04-14 MED ORDER — FENTANYL CITRATE (PF) 100 MCG/2ML IJ SOLN
INTRAMUSCULAR | Status: DC | PRN
  Administered 2018-04-14: 50 ug via INTRAVENOUS

## 2018-04-14 MED ORDER — HYDROCODONE-ACETAMINOPHEN 7.5-325 MG PO TABS: 1.0000 | ORAL_TABLET | ORAL | Status: DC | PRN

## 2018-04-14 MED ORDER — BISACODYL 10 MG RE SUPP: 10.0000 mg | Freq: Every day | RECTAL | Status: DC | PRN

## 2018-04-14 MED ORDER — 0.9 % SODIUM CHLORIDE (POUR BTL) OPTIME
TOPICAL | Status: DC | PRN
  Administered 2018-04-14: 1000 mL

## 2018-04-14 MED ORDER — CEFAZOLIN SODIUM-DEXTROSE 2-4 GM/100ML-% IV SOLN
INTRAVENOUS | Status: AC
  Filled 2018-04-14: qty 100

## 2018-04-14 MED ORDER — PROPOFOL 10 MG/ML IV BOLUS
INTRAVENOUS | Status: AC
  Filled 2018-04-14: qty 20

## 2018-04-14 MED ORDER — METHOCARBAMOL 1000 MG/10ML IJ SOLN
500.0000 mg | Freq: Four times a day (QID) | INTRAVENOUS | Status: DC | PRN
  Filled 2018-04-14: qty 5

## 2018-04-14 MED ORDER — MAGNESIUM CITRATE PO SOLN: 1.0000 | Freq: Once | ORAL | Status: DC | PRN

## 2018-04-14 MED ORDER — TRAMADOL HCL 50 MG PO TABS
50.0000 mg | ORAL_TABLET | Freq: Four times a day (QID) | ORAL | Status: DC
  Administered 2018-04-14 – 2018-04-15 (×3): 50 mg via ORAL
  Filled 2018-04-14 (×3): qty 1

## 2018-04-14 MED ORDER — ASPIRIN EC 325 MG PO TBEC
325.0000 mg | DELAYED_RELEASE_TABLET | Freq: Two times a day (BID) | ORAL | Status: DC
  Administered 2018-04-14 – 2018-04-15 (×2): 325 mg via ORAL
  Filled 2018-04-14 (×2): qty 1

## 2018-04-14 MED ORDER — OXYCODONE HCL 5 MG/5ML PO SOLN: 5.0000 mg | Freq: Once | ORAL | Status: DC | PRN

## 2018-04-14 MED ORDER — KETOROLAC TROMETHAMINE 30 MG/ML IJ SOLN
INTRAMUSCULAR | Status: DC | PRN
  Administered 2018-04-14: 30 mg via INTRAVENOUS

## 2018-04-14 MED ORDER — MIDAZOLAM HCL 2 MG/2ML IJ SOLN
INTRAMUSCULAR | Status: AC
  Administered 2018-04-14: 1 mg
  Filled 2018-04-14: qty 2

## 2018-04-14 MED ORDER — PHENYLEPHRINE 40 MCG/ML (10ML) SYRINGE FOR IV PUSH (FOR BLOOD PRESSURE SUPPORT)
PREFILLED_SYRINGE | INTRAVENOUS | Status: DC | PRN
  Administered 2018-04-14 (×2): 40 ug via INTRAVENOUS

## 2018-04-14 MED ORDER — FENTANYL CITRATE (PF) 100 MCG/2ML IJ SOLN
INTRAMUSCULAR | Status: AC
  Administered 2018-04-14: 50 ug
  Filled 2018-04-14: qty 2

## 2018-04-14 MED ORDER — ACETAMINOPHEN 500 MG PO TABS
500.0000 mg | ORAL_TABLET | Freq: Four times a day (QID) | ORAL | Status: DC
  Administered 2018-04-14 – 2018-04-15 (×3): 500 mg via ORAL
  Filled 2018-04-14 (×3): qty 1

## 2018-04-14 MED ORDER — DIPHENHYDRAMINE HCL 12.5 MG/5ML PO ELIX: 12.5000 mg | ORAL_SOLUTION | ORAL | Status: DC | PRN

## 2018-04-14 MED ORDER — POLYETHYLENE GLYCOL 3350 17 G PO PACK: 17.0000 g | PACK | Freq: Every day | ORAL | Status: DC | PRN

## 2018-04-14 MED ORDER — CEFAZOLIN SODIUM-DEXTROSE 2-4 GM/100ML-% IV SOLN
2.0000 g | Freq: Four times a day (QID) | INTRAVENOUS | Status: AC
  Administered 2018-04-14 – 2018-04-15 (×2): 2 g via INTRAVENOUS
  Filled 2018-04-14 (×2): qty 100

## 2018-04-14 MED ORDER — HYDROCODONE-ACETAMINOPHEN 5-325 MG PO TABS: 1.0000 | ORAL_TABLET | Freq: Four times a day (QID) | ORAL | 0 refills | Status: DC | PRN

## 2018-04-14 MED ORDER — DEXAMETHASONE SODIUM PHOSPHATE 10 MG/ML IJ SOLN
10.0000 mg | Freq: Once | INTRAMUSCULAR | Status: AC
  Administered 2018-04-15: 10 mg via INTRAVENOUS
  Filled 2018-04-14: qty 1

## 2018-04-14 MED ORDER — SENNA-DOCUSATE SODIUM 8.6-50 MG PO TABS: 2.0000 | ORAL_TABLET | Freq: Every day | ORAL | 1 refills | Status: DC

## 2018-04-14 MED ORDER — CHLORHEXIDINE GLUCONATE 4 % EX LIQD: 60.0000 mL | Freq: Once | CUTANEOUS | Status: DC

## 2018-04-14 MED ORDER — FENTANYL CITRATE (PF) 250 MCG/5ML IJ SOLN
INTRAMUSCULAR | Status: AC
  Filled 2018-04-14: qty 5

## 2018-04-14 MED ORDER — MORPHINE SULFATE (PF) 2 MG/ML IV SOLN: 0.5000 mg | INTRAVENOUS | Status: DC | PRN

## 2018-04-14 MED ORDER — ZOLPIDEM TARTRATE 5 MG PO TABS: 5.0000 mg | ORAL_TABLET | Freq: Every evening | ORAL | Status: DC | PRN

## 2018-04-14 MED ORDER — ALIGN 4 MG PO CAPS: 4.0000 mg | ORAL_CAPSULE | Freq: Every day | ORAL | Status: DC

## 2018-04-14 MED ORDER — DOCUSATE SODIUM 100 MG PO CAPS
100.0000 mg | ORAL_CAPSULE | Freq: Two times a day (BID) | ORAL | Status: DC
  Administered 2018-04-14 – 2018-04-15 (×2): 100 mg via ORAL
  Filled 2018-04-14 (×2): qty 1

## 2018-04-14 MED ORDER — ONDANSETRON HCL 4 MG/2ML IJ SOLN: 4.0000 mg | Freq: Four times a day (QID) | INTRAMUSCULAR | Status: DC | PRN

## 2018-04-14 MED ORDER — ASPIRIN EC 325 MG PO TBEC: 325.0000 mg | DELAYED_RELEASE_TABLET | Freq: Every day | ORAL | 0 refills | Status: DC

## 2018-04-14 MED ORDER — BUPIVACAINE HCL (PF) 0.25 % IJ SOLN
INTRAMUSCULAR | Status: DC | PRN
  Administered 2018-04-14: 30 mL

## 2018-04-14 SURGICAL SUPPLY — 59 items
BANDAGE ESMARK 6X9 LF (GAUZE/BANDAGES/DRESSINGS) ×1 IMPLANT
BEARING MENISCAL TIBIAL 3 SM L (Orthopedic Implant) ×2 IMPLANT
BIT DRILL QUICK REL 1/8 2PK SL (DRILL) IMPLANT
BNDG CMPR 9X6 STRL LF SNTH (GAUZE/BANDAGES/DRESSINGS) ×1
BNDG CMPR MED 10X6 ELC LF (GAUZE/BANDAGES/DRESSINGS) ×1
BNDG CMPR MED 15X6 ELC VLCR LF (GAUZE/BANDAGES/DRESSINGS) ×1
BNDG ELASTIC 6X10 VLCR STRL LF (GAUZE/BANDAGES/DRESSINGS) ×2 IMPLANT
BNDG ELASTIC 6X15 VLCR STRL LF (GAUZE/BANDAGES/DRESSINGS) ×3 IMPLANT
BNDG ESMARK 6X9 LF (GAUZE/BANDAGES/DRESSINGS) ×3
BOWL SMART MIX CTS (DISPOSABLE) ×3 IMPLANT
BRNG TIB SM 3 PHS 3 LT MEN (Orthopedic Implant) ×1 IMPLANT
CEMENT BONE R 1X40 (Cement) ×2 IMPLANT
CLOSURE STERI-STRIP 1/2X4 (GAUZE/BANDAGES/DRESSINGS) ×1
CLOSURE WOUND 1/2 X4 (GAUZE/BANDAGES/DRESSINGS) ×1
CLSR STERI-STRIP ANTIMIC 1/2X4 (GAUZE/BANDAGES/DRESSINGS) ×2 IMPLANT
COVER SURGICAL LIGHT HANDLE (MISCELLANEOUS) ×3 IMPLANT
CUFF TOURNIQUET SINGLE 34IN LL (TOURNIQUET CUFF) ×3 IMPLANT
DRAPE EXTREMITY T 121X128X90 (DRAPE) IMPLANT
DRAPE HALF SHEET 40X57 (DRAPES) ×3 IMPLANT
DRAPE U-SHAPE 47X51 STRL (DRAPES) ×3 IMPLANT
DRILL QUICK RELEASE 1/8 INCH (DRILL) ×2
DRSG MEPILEX BORDER 4X8 (GAUZE/BANDAGES/DRESSINGS) ×3 IMPLANT
DURAPREP 26ML APPLICATOR (WOUND CARE) ×3 IMPLANT
ELECT CAUTERY BLADE 6.4 (BLADE) ×3 IMPLANT
ELECT REM PT RETURN 9FT ADLT (ELECTROSURGICAL) ×3
ELECTRODE REM PT RTRN 9FT ADLT (ELECTROSURGICAL) ×1 IMPLANT
GLOVE BIOGEL PI ORTHO PRO SZ8 (GLOVE) ×4
GLOVE ORTHO TXT STRL SZ7.5 (GLOVE) ×3 IMPLANT
GLOVE PI ORTHO PRO STRL SZ8 (GLOVE) ×2 IMPLANT
GLOVE SURG ORTHO 8.0 STRL STRW (GLOVE) ×3 IMPLANT
GOWN STRL REUS W/ TWL XL LVL3 (GOWN DISPOSABLE) ×1 IMPLANT
GOWN STRL REUS W/TWL 2XL LVL3 (GOWN DISPOSABLE) ×3 IMPLANT
GOWN STRL REUS W/TWL XL LVL3 (GOWN DISPOSABLE) ×3
HANDPIECE INTERPULSE COAX TIP (DISPOSABLE) ×3
HOOD PEEL AWAY FACE SHEILD DIS (HOOD) ×3 IMPLANT
HOOD PEEL AWAY FLYTE STAYCOOL (MISCELLANEOUS) ×3 IMPLANT
IMMOBILIZER KNEE 22 UNIV (SOFTGOODS) ×3 IMPLANT
KIT BASIN OR (CUSTOM PROCEDURE TRAY) ×3 IMPLANT
KIT TURNOVER KIT B (KITS) ×3 IMPLANT
MANIFOLD NEPTUNE II (INSTRUMENTS) ×3 IMPLANT
NDL HYPO 21X1.5 SAFETY (NEEDLE) IMPLANT
NEEDLE HYPO 21X1.5 SAFETY (NEEDLE) IMPLANT
NS IRRIG 1000ML POUR BTL (IV SOLUTION) ×3 IMPLANT
PACK BLADE SAW RECIP 70 3 PT (BLADE) ×2 IMPLANT
PACK TOTAL JOINT (CUSTOM PROCEDURE TRAY) ×3 IMPLANT
PAD ARMBOARD 7.5X6 YLW CONV (MISCELLANEOUS) ×4 IMPLANT
PEG FEMORAL PEGGED STRL SM (Knees) ×2 IMPLANT
SET HNDPC FAN SPRY TIP SCT (DISPOSABLE) ×1 IMPLANT
STRIP CLOSURE SKIN 1/2X4 (GAUZE/BANDAGES/DRESSINGS) ×2 IMPLANT
SUCTION FRAZIER HANDLE 10FR (MISCELLANEOUS) ×2
SUCTION TUBE FRAZIER 10FR DISP (MISCELLANEOUS) ×1 IMPLANT
SUT VIC AB 0 CT1 27 (SUTURE) ×3
SUT VIC AB 0 CT1 27XBRD ANBCTR (SUTURE) ×1 IMPLANT
SUT VIC AB 1 CT1 27 (SUTURE) ×3
SUT VIC AB 1 CT1 27XBRD ANBCTR (SUTURE) ×1 IMPLANT
SUT VIC AB 3-0 SH 8-18 (SUTURE) ×3 IMPLANT
SYR CONTROL 10ML LL (SYRINGE) IMPLANT
TOWEL OR 17X26 10 PK STRL BLUE (TOWEL DISPOSABLE) ×3 IMPLANT
TRAY TIBIAL KNEE OXFORD STD AA (Joint) ×2 IMPLANT

## 2018-04-14 NOTE — Transfer of Care (Signed)
Immediate Anesthesia Transfer of Care Note  Patient: Ann Bell  Procedure(s) Performed: UNICOMPARTMENTAL KNEE (Left Knee)  Patient Location: PACU  Anesthesia Type:MAC, Regional and Spinal  Level of Consciousness: awake, alert  and oriented  Airway & Oxygen Therapy: Patient Spontanous Breathing  Post-op Assessment: Report given to RN and Post -op Vital signs reviewed and stable  Post vital signs: Reviewed and stable  Last Vitals:  Vitals Value Taken Time  BP 119/79 04/14/2018  4:34 PM  Temp    Pulse 70 04/14/2018  4:35 PM  Resp 15 04/14/2018  4:35 PM  SpO2 98 % 04/14/2018  4:35 PM  Vitals shown include unvalidated device data.  Last Pain:  Vitals:   04/14/18 1244  TempSrc:   PainSc: 0-No pain         Complications: No apparent anesthesia complications

## 2018-04-14 NOTE — Anesthesia Preprocedure Evaluation (Signed)
Anesthesia Evaluation  Patient identified by MRN, date of birth, ID band Patient awake    Reviewed: Allergy & Precautions, NPO status , Patient's Chart, lab work & pertinent test results  History of Anesthesia Complications Negative for: history of anesthetic complications  Airway Mallampati: II  TM Distance: >3 FB Neck ROM: Full    Dental  (+) Teeth Intact   Pulmonary former smoker,    breath sounds clear to auscultation       Cardiovascular negative cardio ROS   Rhythm:Regular     Neuro/Psych  Headaches,  Neuromuscular disease negative psych ROS   GI/Hepatic Neg liver ROS, PUD, GERD  Medicated and Controlled,  Endo/Other  Hypothyroidism   Renal/GU negative Renal ROS     Musculoskeletal  (+) Arthritis ,   Abdominal   Peds  Hematology negative hematology ROS (+)   Anesthesia Other Findings   Reproductive/Obstetrics                             Anesthesia Physical Anesthesia Plan  ASA: II  Anesthesia Plan: MAC, Regional and Spinal   Post-op Pain Management:    Induction:   PONV Risk Score and Plan: 2 and Ondansetron and Treatment may vary due to age or medical condition  Airway Management Planned: Nasal Cannula  Additional Equipment:   Intra-op Plan:   Post-operative Plan:   Informed Consent: I have reviewed the patients History and Physical, chart, labs and discussed the procedure including the risks, benefits and alternatives for the proposed anesthesia with the patient or authorized representative who has indicated his/her understanding and acceptance.   Dental advisory given  Plan Discussed with: CRNA and Surgeon  Anesthesia Plan Comments:         Anesthesia Quick Evaluation

## 2018-04-14 NOTE — Anesthesia Procedure Notes (Signed)
Spinal  Patient location during procedure: OR Start time: 04/14/2018 2:42 PM End time: 04/14/2018 2:46 PM Staffing Anesthesiologist: Val Eagle, MD Preanesthetic Checklist Completed: patient identified, surgical consent, pre-op evaluation, timeout performed, IV checked, risks and benefits discussed and monitors and equipment checked Spinal Block Patient position: sitting Prep: site prepped and draped and DuraPrep Patient monitoring: heart rate, cardiac monitor, continuous pulse ox and blood pressure Approach: midline Location: L3-4 Injection technique: single-shot Needle Needle type: Pencan  Needle gauge: 24 G Needle length: 10 cm Assessment Sensory level: T6

## 2018-04-14 NOTE — Op Note (Signed)
04/14/2018  4:02 PM  PATIENT:  Ann Bell    PRE-OPERATIVE DIAGNOSIS:  Left anteromedial osteoarthritis  POST-OPERATIVE DIAGNOSIS:  Same  PROCEDURE:  Unicompartmental Knee Arthroplasty  SURGEON:  Eulas Post, MD  PHYSICIAN ASSISTANT: Janace Litten, OPA-C, present and scrubbed throughout the case, critical for completion in a timely fashion, and for retraction, instrumentation, and closure.  ANESTHESIA:   spinal  ESTIMATED BLOOD LOSS: 50 ml  UNIQUE ASPECTS OF THE CASE:  Tibia was very small, she was between a 3 and a 4 but the 4 was too tight.    PREOPERATIVE INDICATIONS:  Ann Bell is a  72 y.o. female with a diagnosis of djd left knee who failed conservative measures and elected for surgical management.    The risks benefits and alternatives were discussed with the patient preoperatively including but not limited to the risks of infection, bleeding, nerve injury, cardiopulmonary complications, blood clots, the need for revision surgery, among others, and the patient was willing to proceed.  OPERATIVE IMPLANTS: Biomet Oxford mobile bearing medial compartment arthroplasty femur size small, tibia size AA, bearing size 3.  OPERATIVE FINDINGS: Endstage grade 4 medial compartment osteoarthritis. No significant changes in the lateral or patellofemoral joint.  The ACL was intact.  OPERATIVE PROCEDURE: The patient was brought to the operating room placed in the supine position. Spinal anesthesia was administered. IV antibiotics were given. The lower extremity was placed in the legholder and prepped and draped in usual sterile fashion.  Time out was performed.  The leg was elevated and exsanguinated and the tourniquet was inflated. Anteromedial incision was performed, and I took care to preserve the MCL. Parapatellar incision was carried out, and the osteophytes were excised, along with the medial meniscus and a small portion of the fat pad.  The extra medullary tibial cutting jig  was applied, using the spoon and the 74mm G-Clamp and the 2 mm shim, and I took care to protect the anterior cruciate ligament insertion and the tibial spine. The medial collateral ligament was also protected, and I resected my proximal tibia, matching the anatomic slope.   The proximal tibial bony cut was removed in one piece, and I turned my attention to the femur.  The intramedullary femoral rod was placed using the drill, and then using the appropriate reference, I assembled the femoral jig, setting my posterior cutting block. I resected my posterior femur, used the 0 spigot for the anterior femur, and then measured my gap.   I then used the appropriate mill to match the extension gap to the flexion gap. The second milling was at a 3.  The gaps were then measured again with the appropriate feeler gauges. Once I had balanced flexion and extension gaps, I then completed the preparation of the femur.  I milled off the anterior aspect of the distal femur to prevent impingement. I also exposed the tibia, and selected the above-named component, and then used the cutting jig to prepare the keel slot on the tibia. I also used the awl to curette out the bone to complete the preparation of the keel. The back wall was intact.  I then placed trial components, and it was found to have excellent motion, and appropriate balance.  I then cemented the components into place, cementing the tibia first, removing all excess cement, and then cementing the femur.  All loose cement was removed.  The real polyethylene insert was applied manually, and the knee was taken through functional range of motion, and  found to have excellent stability and restoration of joint motion, with excellent balance.  The wounds were irrigated copiously, and the parapatellar tissue closed with Vicryl, followed by Vicryl for the subcutaneous tissue, with routine closure with Steri-Strips and sterile gauze.  The tourniquet was released, and  the patient was awakened and extubated and returned to PACU in stable and satisfactory condition. There were no complications.

## 2018-04-14 NOTE — Discharge Instructions (Signed)

## 2018-04-14 NOTE — H&P (Signed)
PREOPERATIVE H&P  Chief Complaint: left knee pain  HPI: Ann Bell is a 72 y.o. female who presents for preoperative history and physical with a diagnosis of left knee anteromedial OA. Symptoms are rated as moderate to severe, and have been worsening.  This is significantly impairing activities of daily living.  She has elected for surgical management.   She has failed injections, activity modification, anti-inflammatories, and assistive devices.  Preoperative X-rays demonstrate end stage degenerative changes with osteophyte formation, loss of joint space, subchondral sclerosis.   Past Medical History:  Diagnosis Date  . Anemia   . Arthritis   . Chronic back pain    spondylolisthesis  . Glaucoma   . Headache   . History of blood transfusion    no abnormal reaction noted  . History of colon polyps    benign  . Hypothyroidism    takes NP Thyroid  . Perforated ulcer ([facility])   . Peripheral neuropathy   . Pneumonia    hx of-yrs ago   Past Surgical History:  Procedure Laterality Date  . ABDOMINAL HYSTERECTOMY    . ANTERIOR LAT LUMBAR FUSION N/A 02/28/2017   Procedure: Lumbar four-five Transpsoas lateral interbody fusion with Lumbar four-five Percutaneous pedicle screw fixation, posterolateral arthrodesis with  Mazor ;  Surgeon: Ditty, Loura Halt, MD;  Location: [facility] OR;  Service: Neurosurgery;  Laterality: N/A;  . APPENDECTOMY  1965  . APPLICATION OF ROBOTIC ASSISTANCE FOR SPINAL PROCEDURE N/A 02/28/2017   Procedure: APPLICATION OF ROBOTIC ASSISTANCE FOR SPINAL PROCEDURE;  Surgeon: Ditty, Loura Halt, MD;  Location: [facility] OR;  Service: Neurosurgery;  Laterality: N/A;  . cataract surgery Bilateral   . CESAREAN SECTION     x 2   . COLONOSCOPY WITH ESOPHAGOGASTRODUODENOSCOPY (EGD)    . cyst removed from ovary  1965  . EPIDURAL BLOCK INJECTION     several times  . ganglion cyst removed from throat    . LUMBAR PERCUTANEOUS PEDICLE SCREW 1 LEVEL N/A 02/28/2017   Procedure: LUMBAR  PERCUTANEOUS PEDICLE SCREW LUMBAR FOUR-FIVE;  Surgeon: Ditty, Loura Halt, MD;  Location: [facility] OR;  Service: Neurosurgery;  Laterality: N/A;   Social History   Socioeconomic History  . Marital status: Married    Spouse name: Not on file  . Number of children: Not on file  . Years of education: Not on file  . Highest education level: Not on file  Occupational History  . Not on file  Social Needs  . Financial resource strain: Not on file  . Food insecurity:    Worry: Not on file    Inability: Not on file  . Transportation needs:    Medical: Not on file    Non-medical: Not on file  Tobacco Use  . Smoking status: Former Smoker    Packs/day: 0.50    Years: 20.00    Pack years: 10.00    Types: Cigarettes    Last attempt to quit: 06/07/1978    Years since quitting: 39.8  . Smokeless tobacco: Never Used  Substance and Sexual Activity  . Alcohol use: Yes  . Drug use: No  . Sexual activity: Not on file  Lifestyle  . Physical activity:    Days per week: Not on file    Minutes per session: Not on file  . Stress: Not on file  Relationships  . Social connections:    Talks on phone: Not on file    Gets together: Not on file    Attends religious service: Not on  file    Active member of club or organization: Not on file    Attends meetings of clubs or organizations: Not on file    Relationship status: Not on file  Other Topics Concern  . Not on file  Social History Narrative  . Not on file   History reviewed. No pertinent family history. Allergies  Allergen Reactions  . Codeine Nausea And Vomiting   Prior to Admission medications   Medication Sig Start Date End Date Taking? Authorizing Provider  Latanoprostene Bunod (VYZULTA) 0.024 % SOLN Place 1 drop into both eyes at bedtime. VYZULTA   Yes [provider]  Multiple Vitamin (MULTIVITAMIN WITH MINERALS) TABS tablet Take 1 tablet by mouth daily.   Yes [provider]  Nutritional Supplements (DHEA PO) Take  1 capsule by mouth daily.   Yes [provider]  pantoprazole (PROTONIX) 40 MG tablet Take 40 mg by mouth every other day. In the morning.   Yes [provider]  Probiotic Product (ALIGN) 4 MG CAPS Take 4 mg by mouth daily.   Yes [provider]  thyroid (NP THYROID) 60 MG tablet Take 60 mg by mouth daily before breakfast.   Yes [provider]     Positive ROS: All other systems have been reviewed and were otherwise negative with the exception of those mentioned in the HPI and as above.  Physical Exam: General: Alert, no acute distress Cardiovascular: No pedal edema Respiratory: No cyanosis, no use of accessory musculature GI: No organomegaly, abdomen is soft and non-tender Skin: No lesions in the area of chief complaint Neurologic: Sensation intact distally Psychiatric: Patient is competent for consent with normal mood and affect Lymphatic: No axillary or cervical lymphadenopathy  MUSCULOSKELETAL: ROM 0-125, medial crepitance and painful arc, lachman intact  Assessment: Left anteromedial OA   Plan: Plan for Procedure(s): UNICOMPARTMENTAL KNEE  The risks benefits and alternatives were discussed with the patient including but not limited to the risks of nonoperative treatment, versus surgical intervention including infection, bleeding, nerve injury,  blood clots, cardiopulmonary complications, morbidity, mortality, among others, and they were willing to proceed.    Patient's anticipated LOS is less than 2 midnights, meeting these requirements: - Younger than 84 - Lives within 1 hour of care - Has a competent adult at home to recover with post-op recover - NO history of  - Chronic pain requiring opiods  - Diabetes  - Coronary Artery Disease  - Heart failure  - Heart attack  - Stroke  - DVT/VTE  - Cardiac arrhythmia  - Respiratory Failure/COPD  - Renal failure  - Anemia  - Advanced Liver disease       Preoperative templating of  the joint replacement has been completed, documented, and submitted to the Operating Room personnel in order to optimize intra-operative equipment management.  Eulas Post, MD Cell [phone]   04/14/2018 12:54 PM

## 2018-04-14 NOTE — Anesthesia Procedure Notes (Signed)
Anesthesia Regional Block: Adductor canal block   Pre-Anesthetic Checklist: ,, timeout performed, Correct Patient, Correct Site, Correct Laterality, Correct Procedure, Correct Position, site marked, Risks and benefits discussed,  Surgical consent,  Pre-op evaluation,  At surgeon's request and post-op pain management  Laterality: Lower and Left  Prep: chloraprep       Needles:  Injection technique: Single-shot  Needle Type: Echogenic Stimulator Needle          Additional Needles:   Procedures:,,,, ultrasound used (permanent image in chart),,,,  Narrative:  Start time: 04/14/2018 1:11 PM End time: 04/14/2018 1:14 PM Injection made incrementally with aspirations every 5 mL.  Performed by: Personally  Anesthesiologist: Val Eagle, MD  Additional Notes: H+P and labs reviewed, risks and benefits discussed with patient, procedure tolerated well without complications

## 2018-04-15 ENCOUNTER — Encounter (HOSPITAL_COMMUNITY): Payer: Self-pay | Admitting: Orthopedic Surgery

## 2018-04-15 DIAGNOSIS — M1712 Unilateral primary osteoarthritis, left knee: Secondary | ICD-10-CM | POA: Diagnosis not present

## 2018-04-15 LAB — CBC
HEMATOCRIT: 39.4 % (ref 36.0–46.0)
Hemoglobin: 12.8 g/dL (ref 12.0–15.0)
MCH: 29 pg (ref 26.0–34.0)
MCHC: 32.5 g/dL (ref 30.0–36.0)
MCV: 89.3 fL (ref 78.0–100.0)
Platelets: 244 10*3/uL (ref 150–400)
RBC: 4.41 MIL/uL (ref 3.87–5.11)
RDW: 13.6 % (ref 11.5–15.5)
WBC: 8.9 10*3/uL (ref 4.0–10.5)

## 2018-04-15 LAB — BASIC METABOLIC PANEL
ANION GAP: 7 (ref 5–15)
BUN: 14 mg/dL (ref 6–20)
CHLORIDE: 108 mmol/L (ref 101–111)
CO2: 22 mmol/L (ref 22–32)
Calcium: 8.4 mg/dL — ABNORMAL LOW (ref 8.9–10.3)
Creatinine, Ser: 0.72 mg/dL (ref 0.44–1.00)
GFR calc Af Amer: >60 mL/min (ref >60)
GLUCOSE: 103 mg/dL — AB (ref 65–99)
Potassium: 4 mmol/L (ref 3.5–5.1)
Sodium: 137 mmol/L (ref 135–145)

## 2018-04-15 NOTE — Anesthesia Postprocedure Evaluation (Signed)
Anesthesia Post Note  Patient: Ann Bell  Procedure(s) Performed: UNICOMPARTMENTAL KNEE (Left Knee)     Patient location during evaluation: PACU Anesthesia Type: Regional, MAC and Spinal Level of consciousness: awake and alert Pain management: pain level controlled Vital Signs Assessment: post-procedure vital signs reviewed and stable Respiratory status: spontaneous breathing, nonlabored ventilation, respiratory function stable and patient connected to nasal cannula oxygen Cardiovascular status: stable and blood pressure returned to baseline Postop Assessment: no apparent nausea or vomiting Anesthetic complications: no    Last Vitals:  Vitals:   04/15/18 0022 04/15/18 0429  BP: 138/60 (!) 123/54  Pulse: 75 63  Resp: 11 18  Temp: 36.9 C 36.9 C  SpO2: 96% 97%    Last Pain:  Vitals:   04/15/18 1254  TempSrc:   PainSc: 2                  Montario Zilka

## 2018-04-15 NOTE — Progress Notes (Signed)
Physical Therapy Treatment Patient Details Name: Ann Bell MRN: [id number] DOB: 1946-08-09 Today's Date: 04/15/2018    History of Present Illness Pt is a 72 y/o female who presents s/p L unicompartmental knee replacement on 04/14/18. PMH significant for peripheral neuropathy, hypothyroidism, glaucoma, chronic back pain, anemia, ALIF L4-L5 02/2017.    PT Comments    Pt progressing well with post-op mobility. Was able to progress ambulation distance and demonstrated safe negotiation of stairs this session. Pt and husband were educated on HEP, car transfer, proper positioning, use of knee immobilizer, and general safety with activity progression.  Pt anticipates d/c home today. Will continue to follow and progress as able per POC.   Follow Up Recommendations  Follow surgeon's recommendation for DC plan and follow-up therapies     Equipment Recommendations  Rolling walker with 5" wheels    Recommendations for Other Services       Precautions / Restrictions Precautions Precautions: Fall;Knee Precaution Comments: Pt was educated on preferred positioning of the knee in extension when resting. NO pillows/rolls/ice packs under knee, but putting a roll under the ankle.  Knee Immobilizer - Left: (Present in room but no order for use) Restrictions Weight Bearing Restrictions: Yes LLE Weight Bearing: Weight bearing as tolerated    Mobility  Bed Mobility Overal bed mobility: Modified Independent Bed Mobility: Supine to Sit           General bed mobility comments: HOB slightly elevated. Pt was able to transition to EOB without assistance.   Transfers Overall transfer level: Needs assistance Equipment used: Rolling walker (2 wheeled) Transfers: Sit to/from Stand Sit to Stand: Supervision         General transfer comment: Light supervision for safety as pt powered up to full stand. Pt demonstrated proper hand placement on seated surface for safety.    Ambulation/Gait Ambulation/Gait assistance: Min guard Gait Distance (Feet): 400 Feet(200' x2) Assistive device: Rolling walker (2 wheeled) Gait Pattern/deviations: Step-through pattern;Decreased stride length;Trunk flexed;Decreased weight shift to left;Decreased dorsiflexion - left Gait velocity: Decreased Gait velocity interpretation: 1.31 - 2.62 ft/sec, indicative of limited community ambulator General Gait Details: VC's for improved posture, increased heel strike, and step-through gait pattern. Pt was able to make corrective changes and maintain throughout gait training.    Stairs Stairs: Yes Stairs assistance: Min guard Stair Management: One rail Left;Step to pattern;Forwards;Sideways Number of Stairs: 10(5x2) General stair comments: Pt was instructed in both forwards and sideways approaches to stair negotiation. She was able to complete well without knee buckle or LOB.    Wheelchair Mobility    Modified Rankin (Stroke Patients Only)       Balance Overall balance assessment: Needs assistance Sitting-balance support: Feet supported;No upper extremity supported Sitting balance-Leahy Scale: Fair     Standing balance support: Bilateral upper extremity supported;During functional activity Standing balance-Leahy Scale: Poor Standing balance comment: Pt was reliant on UE support during dynamic standing activity                            Cognition Arousal/Alertness: Awake/alert Behavior During Therapy: WFL for tasks assessed/performed Overall Cognitive Status: Within Functional Limits for tasks assessed                                        Exercises Total Joint Exercises Quad Sets: 10 reps Heel Slides: 10 reps Straight Leg Raises:  10 reps Long Arc Quad: 10 reps Goniometric ROM: 4-114 AROM    General Comments        Pertinent Vitals/Pain Pain Assessment: Faces Faces Pain Scale: Hurts little more Pain Location: L knee Pain  Descriptors / Indicators: Operative site guarding;Aching Pain Intervention(s): Monitored during session    Home Living                      Prior Function            PT Goals (current goals can now be found in the care plan section) Acute Rehab PT Goals Patient Stated Goal: Home this afternoon PT Goal Formulation: With patient Time For Goal Achievement: 04/22/18 Potential to Achieve Goals: Good Progress towards PT goals: Progressing toward goals    Frequency    7X/week      PT Plan Current plan remains appropriate    Co-evaluation              AM-PAC PT "6 Clicks" Daily Activity  Outcome Measure  Difficulty turning over in bed (including adjusting bedclothes, sheets and blankets)?: None Difficulty moving from lying on back to sitting on the side of the bed? : A Little Difficulty sitting down on and standing up from a chair with arms (e.g., wheelchair, bedside commode, etc,.)?: A Little Help needed moving to and from a bed to chair (including a wheelchair)?: A Little Help needed walking in hospital room?: A Little Help needed climbing 3-5 steps with a railing? : A Little 6 Click Score: 19    End of Session Equipment Utilized During Treatment: Gait belt Activity Tolerance: Patient tolerated treatment well Patient left: in chair;with call bell/phone within reach;with family/visitor present Nurse Communication: Mobility status PT Visit Diagnosis: Unsteadiness on feet (R26.81);Pain;Difficulty in walking, not elsewhere classified (R26.2) Pain - Right/Left: Left Pain - part of body: Knee     Time: 1350-1430 PT Time Calculation (min) (ACUTE ONLY): 40 min  Charges:  $Gait Training: 23-37 mins $Therapeutic Exercise: 8-22 mins                    G Codes:       Ann Bell, PT, DPT Acute Rehabilitation Services Pager: [phone]    Ann Bell 04/15/2018, 2:57 PM

## 2018-04-15 NOTE — Care Management Note (Signed)
Case Management Note  Patient Details  Name: Ann Bell MRN: [id number] Date of Birth: [birth year]  Subjective/Objective:   72 yr old female s/p right total knee arthroplasty.                  Action/Plan: Case manager spoke with patient concerning discharge plan and DME. Patient says she is scheduled to begin outpatient therapy on Monday, April 20, 2018. She will have family support at discharge.    Expected Discharge Date:  04/15/18               Expected Discharge Plan:  OP Rehab(will start ouotpatient on Monday April 20, 2018)  In-House Referral:  NA  Discharge planning Services  CM Consult  Post Acute Care Choice:  NA Choice offered to:  Patient  DME Arranged:  [facility](Has 3in[facility]) DME Agency:  [employer]  HH Arranged:  NA HH Agency:  NA  Status of Service:  Completed, signed off  If discussed at Long Length of Stay Meetings, dates discussed:    Additional Comments:  Durenda Guthrie, RN 04/15/2018, 2:42 PM

## 2018-04-15 NOTE — Evaluation (Signed)
Physical Therapy Evaluation Patient Details Name: Ann Bell MRN: [id number] DOB: [birth year] Today's Date: 04/15/2018   History of Present Illness  Pt is a 72 y/o female who presents s/p L unicompartmental knee replacement on 04/14/18. PMH significant for peripheral neuropathy, hypothyroidism, glaucoma, chronic back pain, anemia, ALIF L4-L5 02/2017.  Clinical Impression  Pt admitted with above diagnosis. Pt currently with functional limitations due to the deficits listed below (see PT Problem List). At the time of PT eval pt was able to perform transfers and ambulation with gross min guard assist to supervision for safety with RW. Pt will require an afternoon treatment session to initiate stair training, as pt reports she anticipates d/c home today. Pt will benefit from skilled PT to increase their independence and safety with mobility to allow discharge to the venue listed below.       Follow Up Recommendations Follow surgeon's recommendation for DC plan and follow-up therapies    Equipment Recommendations  Rolling walker with 5" wheels    Recommendations for Other Services       Precautions / Restrictions Precautions Precautions: Fall;Knee Precaution Comments: Pt was educated on preferred positioning of the knee in extension when resting. NO pillows/rolls/ice packs under knee, but putting a roll under the ankle.  Required Braces or Orthoses: Knee Immobilizer - Left Knee Immobilizer - Left: (Present in room but no order for use) Restrictions Weight Bearing Restrictions: Yes LLE Weight Bearing: Weight bearing as tolerated      Mobility  Bed Mobility Overal bed mobility: Needs Assistance Bed Mobility: Supine to Sit     Supine to sit: Supervision     General bed mobility comments: Supervision for safety. Pt was able to transition to EOB without assistance.   Transfers Overall transfer level: Needs assistance Equipment used: Rolling walker (2 wheeled) Transfers: Sit to/from  Stand Sit to Stand: Min guard         General transfer comment: Hands-on guarding for safety as pt powered up to full stand. Pt was cued for hand placement on seated surface for safety.   Ambulation/Gait Ambulation/Gait assistance: Min guard Gait Distance (Feet): 100 Feet Assistive device: Rolling walker (2 wheeled) Gait Pattern/deviations: Step-through pattern;Decreased stride length;Trunk flexed;Decreased weight shift to left;Decreased dorsiflexion - left Gait velocity: Decreased Gait velocity interpretation: 1.31 - 2.62 ft/sec, indicative of limited community ambulator General Gait Details: VC's for improved posture, increased heel strike, and step-through gait pattern. Pt was able to make corrective changes and maintain throughout gait training.   Stairs            Wheelchair Mobility    Modified Rankin (Stroke Patients Only)       Balance Overall balance assessment: Needs assistance Sitting-balance support: Feet supported;No upper extremity supported Sitting balance-Leahy Scale: Fair     Standing balance support: Bilateral upper extremity supported;During functional activity Standing balance-Leahy Scale: Poor Standing balance comment: Pt was reliant on UE support during dynamic standing activity                             Pertinent Vitals/Pain Pain Assessment: 0-10 Pain Score: 5  Pain Location: L knee Pain Descriptors / Indicators: Operative site guarding;Aching Pain Intervention(s): Monitored during session    Home Living Family/patient expects to be discharged to:: Private residence Living Arrangements: Spouse/significant other Available Help at Discharge: Family;Available 24 hours/day Type of Home: House Home Access: Stairs to enter Entrance Stairs-Rails: Left Entrance Stairs-Number of Steps: 3 in front  door Home Layout: Two level;Able to live on main level with bedroom/bathroom;Full bath on main level Home Equipment: Bedside commode;Cane  - single point;Crutches      Prior Function Level of Independence: Independent               Hand Dominance   Dominant Hand: Right    Extremity/Trunk Assessment   Upper Extremity Assessment Upper Extremity Assessment: Overall WFL for tasks assessed    Lower Extremity Assessment Lower Extremity Assessment: LLE deficits/detail LLE Deficits / Details: Decreased strength and AROM consistent with above mentioned procedure.  LLE: Unable to fully assess due to pain LLE Sensation: decreased light touch    Cervical / Trunk Assessment Cervical / Trunk Assessment: Normal  Communication   Communication: No difficulties  Cognition Arousal/Alertness: Awake/alert Behavior During Therapy: WFL for tasks assessed/performed Overall Cognitive Status: Within Functional Limits for tasks assessed                                        General Comments      Exercises Total Joint Exercises Ankle Circles/Pumps: 10 reps Quad Sets: 10 reps Hip ABduction/ADduction: 10 reps Straight Leg Raises: 10 reps   Assessment/Plan    PT Assessment Patient needs continued PT services  PT Problem List Decreased strength;Decreased range of motion;Decreased activity tolerance;Decreased balance;Decreased mobility;Decreased knowledge of use of DME;Decreased knowledge of precautions;Decreased safety awareness;Pain       PT Treatment Interventions DME instruction;Gait training;Stair training;Functional mobility training;Therapeutic activities;Therapeutic exercise;Neuromuscular re-education;Patient/family education    PT Goals (Current goals can be found in the Care Plan section)  Acute Rehab PT Goals Patient Stated Goal: Home this afternoon PT Goal Formulation: With patient Time For Goal Achievement: 04/22/18 Potential to Achieve Goals: Good    Frequency 7X/week   Barriers to discharge        Co-evaluation               AM-PAC PT "6 Clicks" Daily Activity  Outcome  Measure Difficulty turning over in bed (including adjusting bedclothes, sheets and blankets)?: None Difficulty moving from lying on back to sitting on the side of the bed? : A Little Difficulty sitting down on and standing up from a chair with arms (e.g., wheelchair, bedside commode, etc,.)?: A Little Help needed moving to and from a bed to chair (including a wheelchair)?: A Little Help needed walking in hospital room?: A Little Help needed climbing 3-5 steps with a railing? : A Little 6 Click Score: 19    End of Session Equipment Utilized During Treatment: Gait belt;Left knee immobilizer Activity Tolerance: Patient tolerated treatment well Patient left: in chair;with call bell/phone within reach;with family/visitor present Nurse Communication: Mobility status PT Visit Diagnosis: Unsteadiness on feet (R26.81);Pain;Difficulty in walking, not elsewhere classified (R26.2) Pain - Right/Left: Left Pain - part of body: Knee    Time: 1610-9604 PT Time Calculation (min) (ACUTE ONLY): 25 min   Charges:   PT Evaluation $PT Eval Moderate Complexity: 1 Mod PT Treatments $Gait Training: 8-22 mins   PT G Codes:        [name], PT, DPT Acute Rehabilitation Services Pager: [phone]   [name] 04/15/2018, 10:41 AM

## 2018-04-15 NOTE — Progress Notes (Signed)
AVS given and reviewed with pt and pt's husband. Walker at bedside. All questions answered to satisfaction. Pt escorted off the unit via wheelchair by staff member.

## 2018-04-15 NOTE — Discharge Summary (Signed)
Physician Discharge Summary  Patient ID: Ann Bell MRN: [id number] DOB/AGE: 1945/12/08 72 y.o.  Admit date: 04/14/2018 Discharge date: 04/15/2018  Admission Diagnoses:  Primary localized osteoarthritis of left knee  Discharge Diagnoses:  Principal Problem:   Primary localized osteoarthritis of left knee Active Problems:   S/P knee replacement   Past Medical History:  Diagnosis Date  . Anemia   . Arthritis   . Chronic back pain    spondylolisthesis  . Glaucoma   . Headache   . History of blood transfusion    no abnormal reaction noted  . History of colon polyps    benign  . Hypothyroidism    takes NP Thyroid  . Perforated ulcer (HCC)   . Peripheral neuropathy   . Pneumonia    hx of-yrs ago  . Primary localized osteoarthritis of left knee 04/14/2018    Surgeries: Procedure(s): UNICOMPARTMENTAL KNEE on 04/14/2018   Consultants (if any):   Discharged Condition: Improved  Hospital Course: Ann Bell is an 72 y.o. female who was admitted 04/14/2018 with a diagnosis of Primary localized osteoarthritis of left knee and went to the operating room on 04/14/2018 and underwent the above named procedures.    She was given perioperative antibiotics:  Anti-infectives (From admission, onward)   Start     Dose/Rate Route Frequency Ordered Stop   04/15/18 0600  ceFAZolin (ANCEF) IVPB 2g/100 mL premix     2 g 200 mL/hr over 30 Minutes Intravenous On call to O.R. 04/14/18 1219 04/14/18 1448   04/14/18 2200  ceFAZolin (ANCEF) IVPB 2g/100 mL premix     2 g 200 mL/hr over 30 Minutes Intravenous Every 6 hours 04/14/18 2032 04/15/18 0338   04/14/18 1225  ceFAZolin (ANCEF) 2-4 GM/100ML-% IVPB    Note to Pharmacy:  Kathrene Bongo   : cabinet override      04/14/18 1225 04/14/18 1448    .  She was given sequential compression devices, early ambulation, and aspirin for DVT prophylaxis.  She benefited maximally from the hospital stay and there were no complications.    Recent  vital signs:  Vitals:   04/15/18 0022 04/15/18 0429  BP: 138/60 (!) 123/54  Pulse: 75 63  Resp: 11 18  Temp: 98.5 F (36.9 C) 98.4 F (36.9 C)  SpO2: 96% 97%    Recent laboratory studies:  Lab Results  Component Value Date   HGB 12.8 04/15/2018   HGB 14.1 04/03/2018   HGB 12.3 03/12/2017   Lab Results  Component Value Date   WBC 8.9 04/15/2018   PLT 244 04/15/2018   Lab Results  Component Value Date   INR 1.05 03/10/2017   Lab Results  Component Value Date   NA 137 04/15/2018   K 4.0 04/15/2018   CL 108 04/15/2018   CO2 22 04/15/2018   BUN 14 04/15/2018   CREATININE 0.72 04/15/2018   GLUCOSE 103 (H) 04/15/2018    Discharge Medications:   Allergies as of 04/15/2018      Reactions   Codeine Nausea And Vomiting      Medication List    TAKE these medications   ALIGN 4 MG Caps Take 4 mg by mouth daily.   aspirin EC 325 MG tablet Take 1 tablet (325 mg total) by mouth daily.   baclofen 10 MG tablet Commonly known as:  LIORESAL Take 1 tablet (10 mg total) by mouth 3 (three) times daily. As needed for muscle spasm   DHEA PO Take 1 capsule by  mouth daily.   HYDROcodone-acetaminophen 5-325 MG tablet Commonly known as:  NORCO Take 1-2 tablets by mouth every 6 (six) hours as needed for moderate pain. MAXIMUM TOTAL ACETAMINOPHEN DOSE IS 4000 MG PER DAY   multivitamin with minerals Tabs tablet Take 1 tablet by mouth daily.   NP THYROID 60 MG tablet Generic drug:  thyroid Take 60 mg by mouth daily before breakfast.   ondansetron 4 MG tablet Commonly known as:  ZOFRAN Take 1 tablet (4 mg total) by mouth every 8 (eight) hours as needed for nausea or vomiting.   pantoprazole 40 MG tablet Commonly known as:  PROTONIX Take 40 mg by mouth every other day. In the morning.   sennosides-docusate sodium 8.6-50 MG tablet Commonly known as:  SENOKOT-S Take 2 tablets by mouth daily.   traMADol 50 MG tablet Commonly known as:  ULTRAM Take 1 tablet (50 mg total)  by mouth every 6 (six) hours as needed.   VYZULTA 0.024 % Soln Generic drug:  Latanoprostene Bunod Place 1 drop into both eyes at bedtime. VYZULTA       Diagnostic Studies: Dg Knee Left Port  Result Date: 04/14/2018 CLINICAL DATA:  72 year old female status post left unicompartmental knee replacement. EXAM: PORTABLE LEFT KNEE - 1-2 VIEW COMPARISON:  None. FINDINGS: Portable AP and cross-table lateral views. Arthroplasty hardware in place along the medial compartment of the left knee joint. The hardware appears intact and normally aligned. Surrounding postoperative soft tissue changes including soft tissue gas. No unexpected osseous changes. IMPRESSION: Left knee medial compartment arthroplasty with no adverse features. Electronically Signed   By: Odessa Fleming M.D.   On: 04/14/2018 23:21    Disposition: Discharge disposition: 01-Home or Self Care         Follow-up Information    Teryl Lucy, MD. Schedule an appointment as soon as possible for a visit in 2 weeks.   Specialty:  Orthopedic Surgery Contact information: [address] [place] [address] [phone]            Signed: Eulas Post 04/15/2018, 12:24 PM

## 2018-04-15 NOTE — Progress Notes (Addendum)
Patient ID: Ann Bell, female   DOB: Jun 15, 1946, 72 y.o.   MRN: [id number]     Subjective:  Patient reports pain as mild to moderate.  Patient sitting up on bed and in no acute distress.  Denies any CP or SOB  Objective:   VITALS:   Vitals:   04/14/18 1953 04/14/18 2038 04/15/18 0022 04/15/18 0429  BP:  140/62 138/60 (!) 123/54  Pulse:  82 75 63  Resp:   11 18  Temp: 97.7 F (36.5 C) 98 F (36.7 C) 98.5 F (36.9 C) 98.4 F (36.9 C)  TempSrc:  Oral Oral Oral  SpO2:  100% 96% 97%  Weight:      Height:        ABD soft Sensation intact distally Dorsiflexion/Plantar flexion intact Incision: dressing C/D/I and no drainage   Lab Results  Component Value Date   WBC 8.9 04/15/2018   HGB 12.8 04/15/2018   HCT 39.4 04/15/2018   MCV 89.3 04/15/2018   PLT 244 04/15/2018   BMET    Component Value Date/Time   NA 137 04/15/2018 0426   K 4.0 04/15/2018 0426   CL 108 04/15/2018 0426   CO2 22 04/15/2018 0426   GLUCOSE 103 (H) 04/15/2018 0426   BUN 14 04/15/2018 0426   CREATININE 0.72 04/15/2018 0426   CALCIUM 8.4 (L) 04/15/2018 0426   GFRNONAA >60 04/15/2018 0426   GFRAA >60 04/15/2018 0426     Assessment/Plan: 1 Day Post-Op   Principal Problem:   Primary localized osteoarthritis of left knee Active Problems:   S/P knee replacement   Advance diet Up with therapy  WBAT Dry dressing PRN Follow up with Nevea Spiewak as scheduled DC home     DOUGLAS PARRY, Apolinar Junes 04/15/2018, 10:00 AM  Discussed and agree with above. Teryl Lucy, MD Cell [phone]

## 2018-04-20 ENCOUNTER — Encounter: Payer: Self-pay | Admitting: Physical Therapy

## 2018-04-20 ENCOUNTER — Ambulatory Visit: Payer: Medicare HMO | Attending: Orthopedic Surgery | Admitting: Physical Therapy

## 2018-04-20 DIAGNOSIS — R600 Localized edema: Secondary | ICD-10-CM | POA: Diagnosis present

## 2018-04-20 DIAGNOSIS — M25662 Stiffness of left knee, not elsewhere classified: Secondary | ICD-10-CM | POA: Diagnosis present

## 2018-04-20 DIAGNOSIS — M25562 Pain in left knee: Secondary | ICD-10-CM | POA: Insufficient documentation

## 2018-04-20 DIAGNOSIS — R262 Difficulty in walking, not elsewhere classified: Secondary | ICD-10-CM | POA: Insufficient documentation

## 2018-04-20 NOTE — Therapy (Signed)
[facility] [place] [place] [address] [place], [place], [address] Phone: [phone]   Fax:  [phone]  Physical Therapy Evaluation  Patient Details  Name: Ann Bell MRN: [id number] Date of Birth: 1946/01/20 Referring Provider: Dion Saucier   Encounter Date: 04/20/2018  PT End of Session - 04/20/18 1006    Visit Number  1    Date for PT Re-Evaluation  06/20/18    PT Start Time  0928    PT Stop Time  1012    PT Time Calculation (min)  44 min    Activity Tolerance  Patient tolerated treatment well    Behavior During Therapy  [facility] for tasks assessed/performed       Past Medical History:  Diagnosis Date  . Anemia   . Arthritis   . Chronic back pain    spondylolisthesis  . Glaucoma   . Headache   . History of blood transfusion    no abnormal reaction noted  . History of colon polyps    benign  . Hypothyroidism    takes NP Thyroid  . Perforated ulcer (HCC)   . Peripheral neuropathy   . Pneumonia    hx of-yrs ago  . Primary localized osteoarthritis of left knee 04/14/2018    Past Surgical History:  Procedure Laterality Date  . ABDOMINAL HYSTERECTOMY    . ANTERIOR LAT LUMBAR FUSION N/A 02/28/2017   Procedure: Lumbar four-five Transpsoas lateral interbody fusion with Lumbar four-five Percutaneous pedicle screw fixation, posterolateral arthrodesis with  Mazor ;  Surgeon: Ditty, Loura Halt, MD;  Location: [facility] OR;  Service: Neurosurgery;  Laterality: N/A;  . APPENDECTOMY  1965  . APPLICATION OF ROBOTIC ASSISTANCE FOR SPINAL PROCEDURE N/A 02/28/2017   Procedure: APPLICATION OF ROBOTIC ASSISTANCE FOR SPINAL PROCEDURE;  Surgeon: Ditty, Loura Halt, MD;  Location: [facility] OR;  Service: Neurosurgery;  Laterality: N/A;  . cataract surgery Bilateral   . CESAREAN SECTION     x 2   . COLONOSCOPY WITH ESOPHAGOGASTRODUODENOSCOPY (EGD)    . EPIDURAL BLOCK INJECTION     several times  . ganglion cyst removed from throat    . JOINT  REPLACEMENT    . LUMBAR PERCUTANEOUS PEDICLE SCREW 1 LEVEL N/A 02/28/2017   Procedure: LUMBAR PERCUTANEOUS PEDICLE SCREW LUMBAR FOUR-FIVE;  Surgeon: Ditty, Loura Halt, MD;  Location: [facility] OR;  Service: Neurosurgery;  Laterality: N/A;  . OVARIAN CYST REMOVAL  1965  . PARTIAL KNEE ARTHROPLASTY Left 04/14/2018   Procedure: UNICOMPARTMENTAL KNEE;  Surgeon: Teryl Lucy, MD;  Location: [facility] OR;  Service: Orthopedics;  Laterality: Left;  . REPLACEMENT UNICONDYLAR JOINT KNEE Left 04/14/2018    There were no vitals filed for this visit.   Subjective Assessment - 04/20/18 0925    Subjective  Patient reports that she started having left knee pain in February.  She underwent left medial unicompartmental knee replacement on 04/14/18.  She reports that she tried the exercises that they gave her to do at home, she reports that pain has limited her from moving much    Limitations  Lifting;Standing;Walking;House hold activities    Patient Stated Goals  have good ROM, walk without assistance, have no pain    Currently in Pain?  Yes    Pain Score  5     Pain Location  Knee    Pain Orientation  Left    Pain Descriptors / Indicators  Aching;Sore    Pain Type  Acute pain;Surgical pain    Pain Onset  In the past 7 days    Pain Frequency  Constant    Aggravating Factors   twisting, at night, pain up to 8/10    Pain Relieving Factors  ice, rest, movements 4/10 is the best the pain gets to    Effect of Pain on Daily Activities  limited with everything         [facility] PT Assessment - 04/20/18 0001      Assessment   Medical Diagnosis  left partial TKR    Referring Provider  [place]    Onset Date/Surgical Date  04/14/18    Prior Therapy  no      Precautions   Precautions  None      Balance Screen   Has the patient fallen in the past 6 months  No    Has the patient had a decrease in activity level because of a fear of falling?   No    Is the patient reluctant to leave their home because of a fear of  falling?   No      Home Environment   Additional Comments  very active around the house, yardwork, housework      Prior Function   Level of Independence  Independent    Vocation  Retired    Leisure  walks for exercise, go to gym 4days per week      Observation/Other Assessments-Edema    Edema  Circumferential      Circumferential Edema   Circumferential - Right  36cm    Circumferential - Left   39cm mid patella      ROM / Strength   AROM / PROM / Strength  AROM;PROM;Strength      AROM   AROM Assessment Site  Knee    Right/Left Knee  Left    Left Knee Extension  20    Left Knee Flexion  85      PROM   Overall PROM Comments  pain with passive flexion    PROM Assessment Site  Knee    Right/Left Knee  Left    Left Knee Extension  14    Left Knee Flexion  92      Strength   Overall Strength Comments  4/5 in the available ROM with some pain      Palpation   Palpation comment  a lot of bruising medially, and into the thigh, tender to palpation especially over the scar      Ambulation/Gait   Gait Comments  with a FWW, slow, reports difficulty making a turn      Standardized Balance Assessment   Standardized Balance Assessment  Timed Up and Go Test      Timed Up and Go Test   Normal TUG (seconds)  35                Objective measurements completed on examination: See above findings.              PT Education - 04/20/18 1005    Education Details  low load long duration stretches flexiona nd extension, quad sets and SAQ's    Person(s) Educated  Patient    Methods  Explanation;Demonstration;Tactile cues;Verbal cues;Handout    Comprehension  Verbalized understanding       PT Short Term Goals - 04/20/18 1006      PT SHORT TERM GOAL #1   Title  Independent with initial HEP.     Time  1    Period  Weeks  Status  New        PT Long Term Goals - 04/20/18 1008      PT LONG TERM GOAL #1   Title  Increase ROM of the left knee to 5-120 degrees  flexion    Time  8    Period  Weeks    Status  New      PT LONG TERM GOAL #2   Title  Pain no greater than 3/10     Time  8    Period  Weeks    Status  New      PT LONG TERM GOAL #3   Title  walk without device    Time  8    Period  Weeks    Status  New      PT LONG TERM GOAL #4   Title  go step over step on stairs up and down    Time  8    Period  Weeks    Status  New             Plan - 04/20/18 1006    Clinical Impression Statement  Patient with a medial unicompartmental knee replacement on 04/14/18, she has swelling, significant bruising of the medial knee and thigh area.  Her AROM was 20-85 degrees flexion, she was walking on a FWW, slow and had difficulty with turns, TUG time was 35 seconds.  She would like to walk without device and return to the gym    Clinical Presentation  Evolving    Clinical Decision Making  Low    Rehab Potential  Good    PT Frequency  2x / week    PT Duration  8 weeks    PT Treatment/Interventions  ADLs/Self Care Home Management;Cryotherapy;Clinical cytogeneticist;Therapeutic exercise;Therapeutic activities;Functional mobility training;Stair training;Patient/family education;Manual techniques;Vasopneumatic Device    PT Next Visit Plan  slowly add exercises as tolerated, could try vaso but she reports that pressure hurts    Consulted and Agree with Plan of Care  Patient       Patient will benefit from skilled therapeutic intervention in order to improve the following deficits and impairments:  Abnormal gait, Decreased range of motion, Difficulty walking, Decreased activity tolerance, Pain, Decreased balance, Impaired flexibility, Decreased scar mobility, Decreased mobility, Decreased strength, Increased edema  Visit Diagnosis: Acute pain of left knee - Plan: PT plan of care cert/re-cert  Stiffness of left knee, not elsewhere classified - Plan: PT plan of care cert/re-cert  Difficulty in walking, not elsewhere classified -  Plan: PT plan of care cert/re-cert  Localized edema - Plan: PT plan of care cert/re-cert     Problem List Patient Active Problem List   Diagnosis Date Noted  . Primary localized osteoarthritis of left knee 04/14/2018  . S/P knee replacement 04/14/2018  . Duodenal ulcer 04/10/2018  . Glaucoma 03/30/2018  . GERD (gastroesophageal reflux disease) 03/30/2018  . S/P right colectomy 04/23/2017  . Intractable headache 03/10/2017  . Opioid dependence (HCC) 03/10/2017  . Gait instability 03/10/2017  . Generalized weakness 03/10/2017  . Leukocytosis 03/10/2017  . Hypothyroidism 03/10/2017  . Spondylolisthesis of lumbosacral region 02/28/2017  . S/P lumbar fusion 02/25/2017    Jearld Lesch., PT 04/20/2018, 10:56 AM  [facility] [place] [facility] [address] [place], [place], [address] Phone: [phone]   Fax:  [phone]  Name: Ann Bell MRN: [id number] Date of Birth: 01/24/1946

## 2018-04-28 ENCOUNTER — Ambulatory Visit: Payer: Medicare HMO | Attending: Orthopedic Surgery | Admitting: Physical Therapy

## 2018-04-28 ENCOUNTER — Encounter: Payer: Self-pay | Admitting: Physical Therapy

## 2018-04-28 DIAGNOSIS — M25562 Pain in left knee: Secondary | ICD-10-CM | POA: Diagnosis present

## 2018-04-28 DIAGNOSIS — R262 Difficulty in walking, not elsewhere classified: Secondary | ICD-10-CM | POA: Diagnosis present

## 2018-04-28 DIAGNOSIS — M25662 Stiffness of left knee, not elsewhere classified: Secondary | ICD-10-CM | POA: Insufficient documentation

## 2018-04-28 DIAGNOSIS — R600 Localized edema: Secondary | ICD-10-CM

## 2018-04-28 NOTE — Therapy (Signed)
[address] [place], [place], [address] Phone: [phone]   Fax:  [phone]  Physical Therapy Treatment  Patient Details  Name: Ann Bell MRN: [id number] Date of Birth: 1946-07-03 Referring Provider: Mardelle Matte   Encounter Date: 04/28/2018  PT End of Session - 04/28/18 1223    Visit Number  2    Date for PT Re-Evaluation  06/20/18    PT Start Time  4128    PT Stop Time  1235    PT Time Calculation (min)  51 min    Activity Tolerance  Patient tolerated treatment well    Behavior During Therapy  [facility] for tasks assessed/performed       Past Medical History:  Diagnosis Date  . Anemia   . Arthritis   . Chronic back pain    spondylolisthesis  . Glaucoma   . Headache   . History of blood transfusion    no abnormal reaction noted  . History of colon polyps    benign  . Hypothyroidism    takes NP Thyroid  . Perforated ulcer ([place])   . Peripheral neuropathy   . Pneumonia    hx of-yrs ago  . Primary localized osteoarthritis of left knee 04/14/2018    Past Surgical History:  Procedure Laterality Date  . ABDOMINAL HYSTERECTOMY    . ANTERIOR LAT LUMBAR FUSION N/A 02/28/2017   Procedure: Lumbar four-five Transpsoas lateral interbody fusion with Lumbar four-five Percutaneous pedicle screw fixation, posterolateral arthrodesis with  Mazor ;  Surgeon: Ditty, Kevan Ny, MD;  Location: [place];  Service: Neurosurgery;  Laterality: N/A;  . APPENDECTOMY  1965  . APPLICATION OF ROBOTIC ASSISTANCE FOR SPINAL PROCEDURE N/A 02/28/2017   Procedure: APPLICATION OF ROBOTIC ASSISTANCE FOR SPINAL PROCEDURE;  Surgeon: Ditty, Kevan Ny, MD;  Location: [place];  Service: Neurosurgery;  Laterality: N/A;  . cataract surgery Bilateral   . CESAREAN SECTION     x 2   . COLONOSCOPY WITH ESOPHAGOGASTRODUODENOSCOPY (EGD)    . EPIDURAL BLOCK INJECTION     several times  . ganglion cyst removed from throat    . JOINT  REPLACEMENT    . LUMBAR PERCUTANEOUS PEDICLE SCREW 1 LEVEL N/A 02/28/2017   Procedure: LUMBAR PERCUTANEOUS PEDICLE SCREW LUMBAR FOUR-FIVE;  Surgeon: Ditty, Kevan Ny, MD;  Location: [place];  Service: Neurosurgery;  Laterality: N/A;  . OVARIAN CYST REMOVAL  1965  . PARTIAL KNEE ARTHROPLASTY Left 04/14/2018   Procedure: UNICOMPARTMENTAL KNEE;  Surgeon: Marchia Bond, MD;  Location: [place];  Service: Orthopedics;  Laterality: Left;  . REPLACEMENT UNICONDYLAR JOINT KNEE Left 04/14/2018    There were no vitals filed for this visit.  Subjective Assessment - 04/28/18 1148    Subjective  Pt enters clinic reporting that [facility]    Currently in Pain?  Yes    Pain Score  3     Pain Location  Knee    Pain Orientation  Left                       [facility] Adult PT Treatment/Exercise - 04/28/18 0001      Exercises   Exercises  Knee/Hip      Knee/Hip Exercises: Machines for Strengthening   Cybex Leg Press  20lb 3x10       Knee/Hip Exercises: Standing   Forward Step Up  Left;2 sets;10 reps;Hand Hold: 2;Step Height: 4"  Other Standing Knee Exercises  TKE ball vs wall LLE x15       Knee/Hip Exercises: Seated   Long Arc Quad  Left;2 sets;10 reps;Weights    Long Arc Quad Weight  2 lbs.    Hamstring Curl  Left;2 sets;10 reps    Hamstring Limitations  green tband    Sit to Sand  10 reps;without UE support;1 set blue chair       Modalities   Modalities  Vasopneumatic      Vasopneumatic   Number Minutes Vasopneumatic   10 minutes    Vasopnuematic Location   Knee    Vasopneumatic Pressure  Medium    Vasopneumatic Temperature   lowest       Manual Therapy   Manual Therapy  Passive ROM    Passive ROM  L knee flex and ext                PT Short Term Goals - 04/28/18 1224      PT SHORT TERM GOAL #1   Title  Independent with initial HEP.     Status  Achieved        PT Long Term Goals - 04/28/18 1224      PT LONG TERM  GOAL #2   Title  Pain no greater than 3/10     Status  On-going      PT LONG TERM GOAL #3   Title  walk without device    Status  Partially Met            Plan - 04/28/18 1224    Clinical Impression Statement  Pt enters clinic ambulating without AD. She has good L knee passive and active range of motion. She does lack some L knee extension. [facility] the [facility] and doing bike, NuStep, and UE exercises. Tolerated all of today's activities well.      PT Frequency  2x / week    PT Duration  8 weeks    PT Next Visit Plan  slowly add exercises as tolerated       Patient will benefit from skilled therapeutic intervention in order to improve the following deficits and impairments:  Abnormal gait, Decreased range of motion, Difficulty walking, Decreased activity tolerance, Pain, Decreased balance, Impaired flexibility, Decreased scar mobility, Decreased mobility, Decreased strength, Increased edema  Visit Diagnosis: Acute pain of left knee  Stiffness of left knee, not elsewhere classified  Difficulty in walking, not elsewhere classified  Localized edema     Problem List Patient Active Problem List   Diagnosis Date Noted  . Primary localized osteoarthritis of left knee 04/14/2018  . S/P knee replacement 04/14/2018  . Duodenal ulcer 04/10/2018  . Glaucoma 03/30/2018  . GERD (gastroesophageal reflux disease) 03/30/2018  . S/P right colectomy 04/23/2017  . Intractable headache 03/10/2017  . Opioid dependence ([place]) 03/10/2017  . Gait instability 03/10/2017  . Generalized weakness 03/10/2017  . Leukocytosis 03/10/2017  . Hypothyroidism 03/10/2017  . Spondylolisthesis of lumbosacral region 02/28/2017  . S/P lumbar fusion 02/25/2017    Scot Jun, PTA 04/28/2018, 12:28 PM  [place], [place], [address] Phone: [phone]   Fax:  [phone]  Name: Ann Bell MRN:  [id number] Date of Birth: December 16, 1945

## 2018-05-05 ENCOUNTER — Encounter: Payer: Self-pay | Admitting: Physical Therapy

## 2018-05-05 ENCOUNTER — Ambulatory Visit: Payer: Medicare HMO | Admitting: Physical Therapy

## 2018-05-05 DIAGNOSIS — M25562 Pain in left knee: Secondary | ICD-10-CM | POA: Diagnosis not present

## 2018-05-05 DIAGNOSIS — M25662 Stiffness of left knee, not elsewhere classified: Secondary | ICD-10-CM

## 2018-05-05 DIAGNOSIS — R262 Difficulty in walking, not elsewhere classified: Secondary | ICD-10-CM

## 2018-05-05 DIAGNOSIS — R600 Localized edema: Secondary | ICD-10-CM

## 2018-05-05 NOTE — Therapy (Signed)
[facility] [address] [place], [place], [address] Phone: [phone]   Fax:  [phone]  Physical Therapy Treatment  Patient Details  Name: Ann Bell MRN: [id number] Date of Birth: 12/24/1945 Referring Provider: [place]   Encounter Date: 05/05/2018  PT End of Session - 05/05/18 1050    Visit Number  3    Date for PT Re-Evaluation  06/20/18    PT Start Time  1010    PT Stop Time  1105    PT Time Calculation (min)  55 min    Activity Tolerance  Patient tolerated treatment well    Behavior During Therapy  [facility] for tasks assessed/performed       Past Medical History:  Diagnosis Date  . Anemia   . Arthritis   . Chronic back pain    spondylolisthesis  . Glaucoma   . Headache   . History of blood transfusion    no abnormal reaction noted  . History of colon polyps    benign  . Hypothyroidism    takes NP Thyroid  . Perforated ulcer (HCC)   . Peripheral neuropathy   . Pneumonia    hx of-yrs ago  . Primary localized osteoarthritis of left knee 04/14/2018    Past Surgical History:  Procedure Laterality Date  . ABDOMINAL HYSTERECTOMY    . ANTERIOR LAT LUMBAR FUSION N/A 02/28/2017   Procedure: Lumbar four-five Transpsoas lateral interbody fusion with Lumbar four-five Percutaneous pedicle screw fixation, posterolateral arthrodesis with  Mazor ;  Surgeon: Ditty, Benjamin Jared, MD;  Location: [facility] OR;  Service: Neurosurgery;  Laterality: N/A;  . APPENDECTOMY  1965  . APPLICATION OF ROBOTIC ASSISTANCE FOR SPINAL PROCEDURE N/A 02/28/2017   Procedure: APPLICATION OF ROBOTIC ASSISTANCE FOR SPINAL PROCEDURE;  Surgeon: Ditty, Benjamin Jared, MD;  Location: [facility];  Service: Neurosurgery;  Laterality: N/A;  . cataract surgery Bilateral   . CESAREAN SECTION     x 2   . COLONOSCOPY WITH ESOPHAGOGASTRODUODENOSCOPY (EGD)    . EPIDURAL BLOCK INJECTION     several times  . ganglion cyst removed from throat    . JOINT  REPLACEMENT    . LUMBAR PERCUTANEOUS PEDICLE SCREW 1 LEVEL N/A 02/28/2017   Procedure: LUMBAR PERCUTANEOUS PEDICLE SCREW LUMBAR FOUR-FIVE;  Surgeon: Ditty, Benjamin Jared, MD;  [facility];  Service: Neurosurgery;  Laterality: N/A;  . OVARIAN CYST REMOVAL  1965  . PARTIAL KNEE ARTHROPLASTY Left 04/14/2018   Procedure: UNICOMPARTMENTAL KNEE;  Surgeon: Landau, Joshua, MD;  Location: [facility];  Service: Orthopedics;  Laterality: Left;  . REPLACEMENT UNICONDYLAR JOINT KNEE Left 04/14/2018    There were no vitals filed for this visit.  Subjective Assessment - 05/05/18 1022    Subjective  Patient reports just swelling in the knee is the only thing that bothers her    Currently in Pain?  No/denies         [facility] PT Assessment - 05/05/18 0001      AROM   Left Knee Extension  10    Left Knee Flexion  100                   [facility] Adult PT Treatment/Exercise - 05/05/18 0001      Ambulation/Gait   Gait Comments  no device, up and down stairs step over step with light HHA      Knee/Hip Exercises: Aerobic   Nustep  level 5 x 6 minutes        Knee/Hip Exercises: Machines for Strengthening   Cybex Knee Extension  5# 3x10    Cybex Knee Flexion  25# 3x10    Cybex Leg Press  20lb 3x10       Knee/Hip Exercises: Standing   Other Standing Knee Exercises  resisted gait all directions      Vasopneumatic   Number Minutes Vasopneumatic   10 minutes    Vasopnuematic Location   Knee    Vasopneumatic Pressure  Medium    Vasopneumatic Temperature   lowest       Manual Therapy   Manual Therapy  Passive ROM    Passive ROM  L knee flex and ext                PT Short Term Goals - 04/28/18 1224      PT SHORT TERM GOAL #1   Title  Independent with initial HEP.     Status  Achieved        PT Long Term Goals - 05/05/18 1054      PT LONG TERM GOAL #1   Title  Increase ROM of the left knee to 5-120 degrees flexion    Status  Partially Met      PT LONG TERM GOAL #3   Title   walk without device    Status  Partially Met            Plan - 05/05/18 1050    Clinical Impression Statement  Added weights today without difficulty, her ROM is improving, she is walking very well without device.  Still with bruising and swelling in the left knee.  Able to go up and down stairs step over step with light HHA    PT Next Visit Plan  slowly add exercises as tolerated    Consulted and Agree with Plan of Care  Patient       Patient will benefit from skilled therapeutic intervention in order to improve the following deficits and impairments:  Abnormal gait, Decreased range of motion, Difficulty walking, Decreased activity tolerance, Pain, Decreased balance, Impaired flexibility, Decreased scar mobility, Decreased mobility, Decreased strength, Increased edema  Visit Diagnosis: Acute pain of left knee  Stiffness of left knee, not elsewhere classified  Difficulty in walking, not elsewhere classified  Localized edema     Problem List Patient Active Problem List   Diagnosis Date Noted  . Primary localized osteoarthritis of left knee 04/14/2018  . S/P knee replacement 04/14/2018  . Duodenal ulcer 04/10/2018  . Glaucoma 03/30/2018  . GERD (gastroesophageal reflux disease) 03/30/2018  . S/P right colectomy 04/23/2017  . Intractable headache 03/10/2017  . Opioid dependence (HCC) 03/10/2017  . Gait instability 03/10/2017  . Generalized weakness 03/10/2017  . Leukocytosis 03/10/2017  . Hypothyroidism 03/10/2017  . Spondylolisthesis of lumbosacral region 02/28/2017  . S/P lumbar fusion 02/25/2017    , W., PT 05/05/2018, 10:54 AM  [facility] [address] [place], [place], [address] Phone: [phone]   Fax:  [phone]  Name: Ann Bell MRN: [id number] Date of Birth: 05/06/1946   

## 2018-05-08 ENCOUNTER — Encounter: Payer: Medicare HMO | Admitting: Physical Therapy

## 2018-05-13 ENCOUNTER — Ambulatory Visit (INDEPENDENT_AMBULATORY_CARE_PROVIDER_SITE_OTHER): Payer: Medicare HMO | Admitting: Internal Medicine

## 2018-05-13 ENCOUNTER — Encounter: Payer: Self-pay | Admitting: Internal Medicine

## 2018-05-13 VITALS — BP 126/68 | HR 68 | Temp 98.2°F | Resp 16 | Ht 63.0 in | Wt 133.5 lb

## 2018-05-13 DIAGNOSIS — K219 Gastro-esophageal reflux disease without esophagitis: Secondary | ICD-10-CM

## 2018-05-13 DIAGNOSIS — E039 Hypothyroidism, unspecified: Secondary | ICD-10-CM

## 2018-05-13 DIAGNOSIS — R7303 Prediabetes: Secondary | ICD-10-CM | POA: Diagnosis not present

## 2018-05-13 DIAGNOSIS — Z9049 Acquired absence of other specified parts of digestive tract: Secondary | ICD-10-CM

## 2018-05-13 DIAGNOSIS — Z96659 Presence of unspecified artificial knee joint: Secondary | ICD-10-CM | POA: Diagnosis not present

## 2018-05-13 DIAGNOSIS — N951 Menopausal and female climacteric states: Secondary | ICD-10-CM | POA: Diagnosis not present

## 2018-05-13 NOTE — Patient Instructions (Signed)
Please come back in 3 months for checkup  Okay to decrease aspirin to 81 mg daily  Stay active, watch for   calf pain or swelling.

## 2018-05-13 NOTE — Progress Notes (Signed)
Subjective:    Patient ID: Ann Bell, female    DOB: [birth year], 72 y.o.   MRN: [id number]  DOS:  05/13/2018 Type of visit - description : New patient Interval history: Here to get established, referred by Vinnie Langton, one of my patients. In general, she feels well. Had a knee replacement a month ago, recuperating very well.  Pain is minimal, she is back trying to do her normal physical activity. She was recommended to take aspirin 325 mg twice a day to prevent clots, she is quite concerned because a history of PUD and she has been taking 1 tablet most days.    Review of Systems Denies chest pain, difficulty breathing. No calf pain or swelling No palpitations No nausea, vomiting, diarrhea.  Past Medical History:  Diagnosis Date  . Anemia   . Arthritis   . Chronic back pain    spondylolisthesis  . Glaucoma   . Headache   . History of blood transfusion    no abnormal reaction noted  . History of colon polyps    benign  . Hypothyroidism    takes NP Thyroid  . Perforated ulcer (HCC)   . Peripheral neuropathy   . Pneumonia    hx of-yrs ago  . Primary localized osteoarthritis of left knee 04/14/2018    Past Surgical History:  Procedure Laterality Date  . ABDOMINAL HYSTERECTOMY  1973   still has one ovary  . ANTERIOR LAT LUMBAR FUSION N/A 02/28/2017   Procedure: Lumbar four-five Transpsoas lateral interbody fusion with Lumbar four-five Percutaneous pedicle screw fixation, posterolateral arthrodesis with  Mazor ;  Surgeon: Ditty, Loura Halt, MD;  Location: [facility] OR;  Service: Neurosurgery;  Laterality: N/A;  . APPENDECTOMY  1965  . APPLICATION OF ROBOTIC ASSISTANCE FOR SPINAL PROCEDURE N/A 02/28/2017   Procedure: APPLICATION OF ROBOTIC ASSISTANCE FOR SPINAL PROCEDURE;  Surgeon: Ditty, Loura Halt, MD;  Location: [facility] OR;  Service: Neurosurgery;  Laterality: N/A;  . cataract surgery Bilateral   . CESAREAN SECTION     x2, 1968, 1972  . COLON SURGERY  2018   hemi-colectomy resection  . COLONOSCOPY WITH ESOPHAGOGASTRODUODENOSCOPY (EGD)    . EPIDURAL BLOCK INJECTION     several times  . ganglion cyst removed from throat    . JOINT REPLACEMENT    . LUMBAR PERCUTANEOUS PEDICLE SCREW 1 LEVEL N/A 02/28/2017   Procedure: LUMBAR PERCUTANEOUS PEDICLE SCREW LUMBAR FOUR-FIVE;  Surgeon: Ditty, Loura Halt, MD;  Location: [facility] OR;  Service: Neurosurgery;  Laterality: N/A;  . OVARIAN CYST REMOVAL  1965  . PARTIAL KNEE ARTHROPLASTY Left 04/14/2018   Procedure: UNICOMPARTMENTAL KNEE;  Surgeon: Teryl Lucy, MD;  Location: [facility] OR;  Service: Orthopedics;  Laterality: Left;  . REPLACEMENT UNICONDYLAR JOINT KNEE Left 04/14/2018  . TONSILLECTOMY AND ADENOIDECTOMY  1950   Family History  Problem Relation Age of Onset  . Diabetes Mother   . Breast cancer Maternal Grandmother   . Liver disease Paternal Grandfather   . Hearing loss Father   . Colon cancer Neg Hx     Social History   Socioeconomic History  . Marital status: Married    Spouse name: Not on file  . Number of children: 2  . Years of education: Not on file  . Highest education level: Not on file  Occupational History  . Occupation: n/a  Social Needs  . Financial resource strain: Not on file  . Food insecurity:    Worry: Not on file    Inability: Not on  file  . Transportation needs:    Medical: Not on file    Non-medical: Not on file  Tobacco Use  . Smoking status: Former Smoker    Packs/day: 0.50    Years: 20.00    Pack years: 10.00    Types: Cigarettes    Last attempt to quit: 06/07/1978    Years since quitting: 39.9  . Smokeless tobacco: Never Used  Substance and Sexual Activity  . Alcohol use: Yes    Comment: socially   . Drug use: No  . Sexual activity: Not Currently  Lifestyle  . Physical activity:    Days per week: Not on file    Minutes per session: Not on file  . Stress: Not on file  Relationships  . Social connections:    Talks on phone: Not on file    Gets  together: Not on file    Attends religious service: Not on file    Active member of club or organization: Not on file    Attends meetings of clubs or organizations: Not on file    Relationship status: Not on file  . Intimate partner violence:    Fear of current or ex partner: Not on file    Emotionally abused: Not on file    Physically abused: Not on file    Forced sexual activity: Not on file  Other Topics Concern  . Not on file  Social History Narrative   Lives w/ husband     Family History  Problem Relation Age of Onset  . Diabetes Mother   . Breast cancer Maternal Grandmother   . Liver disease Paternal Grandfather   . Hearing loss Father   . Colon cancer Neg Hx      Allergies as of 05/13/2018      Reactions   Codeine Nausea And Vomiting      Medication List        Accurate as of 05/13/18 11:59 PM. Always use your most recent med list.          aspirin EC 81 MG tablet Take 81 mg by mouth daily.   baclofen 10 MG tablet Commonly known as:  LIORESAL Take 1 tablet (10 mg total) by mouth 3 (three) times daily. As needed for muscle spasm   DHEA PO Take 1 capsule by mouth daily.   multivitamin with minerals Tabs tablet Take 1 tablet by mouth daily.   NALTREXONE HCL PO Take 4.5 mg by mouth daily.   NP THYROID 60 MG tablet Generic drug:  thyroid Take 60 mg by mouth daily before breakfast.   ondansetron 4 MG tablet Commonly known as:  ZOFRAN Take 1 tablet (4 mg total) by mouth every 8 (eight) hours as needed for nausea or vomiting.   pantoprazole 40 MG tablet Commonly known as:  PROTONIX Take 40 mg by mouth every other day. In the morning.   progesterone 100 MG capsule Commonly known as:  PROMETRIUM Take 100 mg by mouth daily.   sennosides-docusate sodium 8.6-50 MG tablet Commonly known as:  SENOKOT-S Take 2 tablets by mouth daily.   Testosterone 20 % Crea 0.25 mg by Does not apply route daily.   traMADol 50 MG tablet Commonly known as:   ULTRAM Take 1 tablet (50 mg total) by mouth every 6 (six) hours as needed.   VYZULTA 0.024 % Soln Generic drug:  Latanoprostene Bunod Place 1 drop into both eyes at bedtime. VYZULTA          Objective:  Physical Exam BP 126/68 (BP Location: Left Arm, Patient Position: Sitting, Cuff Size: Small)   Pulse 68   Temp 98.2 F (36.8 C) (Oral)   Resp 16   Ht 5\' 3"  (1.6 m)   Wt 133 lb 8 oz (60.6 kg)   SpO2 97%   BMI 23.65 kg/m  General:   Well developed, NAD, see BMI.  HEENT:  Normocephalic . Face symmetric, atraumatic Neck: No thyromegaly Lungs:  CTA B Normal respiratory effort, no intercostal retractions, no accessory muscle use. Heart: RRR,  no murmur.  no pretibial edema bilaterally. Calves : Symmetric, soft. Abdomen:  Not distended, soft, non-tender. No rebound or rigidity.   Skin: Not pale. Not jaundice Neurologic:  alert & oriented X3.  Speech normal, gait appropriate for age and unassisted Psych--  Cognition and judgment appear intact.  Cooperative with normal attention span and concentration.  Behavior appropriate. No anxious or depressed appearing.     Assessment & Plan:   Assessment (new patient, referred by [name]). Prediabetes Hypothyroidism DJD Menopause  GI:  - GERD, constipation - 06-2016: Pneumoperitoneum due to perforated duodenal ulcer.  Nonsurgical treatment. - 11-2016: Epigastric pain, had a EGD and colonoscopy, + gastritis, negative H. pylori, fundic gland polyps.  Mild duodenitis. Large tubular adenoma - Right colectomy 03/2017 Glaucoma Follows by Theodora Blow NP, a holistic nurse practitioner  PLAN: Labs from 07-2017: A1c 6.0.  CMP: Creatinine 0.7, albumin 4.9. Ferritin 97 normal. Vitamin D 66.  Normal. CBC normal, hemoglobin 14.7 Free T3 5.5, slightly high.  Free T4 normal.  TSH 0.8 normal. FSH, LH consistent with menopause. Progesterone: 0.3, slightly elevated.  Estrogen 74, high for postmenopausal state Total cholesterol  242, LDL 154, HDL 61.  Prediabetes: A1c 6.0, recheck on RTC Hypothyroidism: Managed elsewhere by  Theodora Blow NP.  Last TSH satisfactory Menopausal: Taking testosterone and progesterone as prescribed by Brials NP. GERD: Currently controlled History of PUD with perforated duodenal ulcer: Patient reluctant to take aspirin 325 mg twice daily prescribed for DVT prevention after TKR, she is 1 month out of surgery, no calf pain or swelling, she is active.  I agree with her concerns, recommend to continue with aspirin but we can try a lower dose of 81 mg.  Watch for leg swelling or pain. Naltrexone: Has been on such medication for about 10 years, strongly denies any history of alcohol or narcotics abuse, was prescribed by her holistic nurse practitioner for cancer prevention. Off label use of medications: Patient aware I won't RF  medications for off label use.  I also said that the use of progesterone/ testosterone for menopausal sx caries  risks.  Offered a Endo and gyn referral. declined Preventive care: Reports she has not done Pap smear in years and she has elected not to pursue mammograms RTC 3 months.   Today, I spent more than  35  min with the patient: >50% of the time counseling regards risk of using hormones off label, reviewing the chart.

## 2018-05-13 NOTE — Progress Notes (Signed)
Pre visit review using our clinic review tool, if applicable. No additional management support is needed unless otherwise documented below in the visit note. 

## 2018-05-14 DIAGNOSIS — R739 Hyperglycemia, unspecified: Secondary | ICD-10-CM | POA: Insufficient documentation

## 2018-05-14 DIAGNOSIS — Z09 Encounter for follow-up examination after completed treatment for conditions other than malignant neoplasm: Secondary | ICD-10-CM | POA: Insufficient documentation

## 2018-05-14 DIAGNOSIS — R7303 Prediabetes: Secondary | ICD-10-CM | POA: Insufficient documentation

## 2018-05-14 DIAGNOSIS — N951 Menopausal and female climacteric states: Secondary | ICD-10-CM | POA: Insufficient documentation

## 2018-05-14 NOTE — Assessment & Plan Note (Signed)
New pt Labs from 07-2017: A1c 6.0.  CMP: Creatinine 0.7, albumin 4.9. Ferritin 97 normal. Vitamin D 66.  Normal. CBC normal, hemoglobin 14.7 Free T3 5.5, slightly high.  Free T4 normal.  TSH 0.8 normal. FSH, LH consistent with menopause. Progesterone: 0.3, slightly elevated.  Estrogen 74, high for postmenopausal state Total cholesterol 242, LDL 154, HDL 61.  Prediabetes: A1c 6.0, recheck on RTC Hypothyroidism: Managed elsewhere by  Theodora Blow NP.  Last TSH satisfactory Menopausal: Taking testosterone and progesterone as prescribed by Brials NP. GERD: Currently controlled History of PUD with perforated duodenal ulcer: Patient reluctant to take aspirin 325 mg twice daily prescribed for DVT prevention after TKR, she is 1 month out of surgery, no calf pain or swelling, she is active.  I agree with her concerns, recommend to continue with aspirin but we can try a lower dose of 81 mg.  Watch for leg swelling or pain. Naltrexone: Has been on such medication for about 10 years, strongly denies any history of alcohol or narcotics abuse, was prescribed by her holistic nurse practitioner for cancer prevention. Off label use of medications: Patient aware I won't RF  medications for off label use.  I also said that the use of progesterone/ testosterone for menopausal sx caries  risks.  Offered a Endo and gyn referral. declined Preventive care: Reports she has not done Pap smear in years and she has elected not to pursue mammograms RTC 3 months.

## 2018-05-15 ENCOUNTER — Telehealth: Payer: Self-pay

## 2018-05-15 ENCOUNTER — Encounter (INDEPENDENT_AMBULATORY_CARE_PROVIDER_SITE_OTHER): Payer: Self-pay

## 2018-05-15 ENCOUNTER — Telehealth: Payer: Self-pay | Admitting: Internal Medicine

## 2018-05-15 MED ORDER — TRAMADOL HCL 50 MG PO TABS: 50.0000 mg | ORAL_TABLET | Freq: Two times a day (BID) | ORAL | 0 refills | Status: DC | PRN

## 2018-05-15 NOTE — Telephone Encounter (Signed)
Chartered loss adjuster phoned pharmacist at [facility]. Per Dr. Drue Novel, OK to d/c his order in favor of ortho doctor order for tramadol. Pharmacy rep. Verbalized understanding.

## 2018-05-15 NOTE — Telephone Encounter (Signed)
Patient request a refill of tramadol previously prescribed by orthopedic for recent knee replacement.  Will do

## 2018-05-18 ENCOUNTER — Encounter: Payer: Self-pay | Admitting: Internal Medicine

## 2018-05-18 LAB — CBC AND DIFFERENTIAL
HEMATOCRIT: 43 (ref 36–46)
Hemoglobin: 14.7 (ref 12.0–16.0)
NEUTROS ABS: 3
Platelets: 253 (ref 150–399)
WBC: 5.3

## 2018-05-18 LAB — LIPID PANEL
CHOLESTEROL: 218 — AB (ref 0–200)
HDL: 53 (ref 35–70)
LDL Cholesterol: 136
Triglycerides: 143 (ref 40–160)

## 2018-05-18 LAB — IRON,TIBC AND FERRITIN PANEL: FERRITIN: 254

## 2018-05-18 LAB — HEPATIC FUNCTION PANEL
ALT: 18 (ref 7–35)
AST: 24 (ref 13–35)
Alkaline Phosphatase: 64 (ref 25–125)
Bilirubin, Total: 0.3

## 2018-05-18 LAB — HEMOGLOBIN A1C: Hemoglobin A1C: 5.9

## 2018-05-18 LAB — TSH: TSH: 1.54 (ref 0.41–5.90)

## 2018-05-18 LAB — VITAMIN D 25 HYDROXY (VIT D DEFICIENCY, FRACTURES): VIT D 25 HYDROXY: 56.1

## 2018-05-18 LAB — BASIC METABOLIC PANEL
BUN: 9 (ref 4–21)
CREATININE: 0.7 (ref 0.5–1.1)
Glucose: 101
POTASSIUM: 4.2 (ref 3.4–5.3)
Sodium: 142 (ref 137–147)

## 2018-05-18 LAB — POCT ERYTHROCYTE SEDIMENTATION RATE, NON-AUTOMATED: Sed Rate: 2

## 2018-05-19 ENCOUNTER — Encounter: Payer: Self-pay | Admitting: Physical Therapy

## 2018-05-19 ENCOUNTER — Ambulatory Visit: Payer: Medicare HMO | Admitting: Physical Therapy

## 2018-05-19 DIAGNOSIS — M25562 Pain in left knee: Secondary | ICD-10-CM

## 2018-05-19 DIAGNOSIS — R262 Difficulty in walking, not elsewhere classified: Secondary | ICD-10-CM

## 2018-05-19 DIAGNOSIS — M25662 Stiffness of left knee, not elsewhere classified: Secondary | ICD-10-CM

## 2018-05-19 DIAGNOSIS — R600 Localized edema: Secondary | ICD-10-CM

## 2018-05-19 NOTE — Therapy (Signed)
[place], [place], [address] Phone: [phone]   Fax:  [phone]  Physical Therapy Treatment  Patient Details  Name: Ann Bell MRN: [id number] Date of Birth: Aug 28, 1946 Referring Provider: Mardelle Matte   Encounter Date: 05/19/2018  PT End of Session - 05/19/18 1350    Visit Number  4    Date for PT Re-Evaluation  06/20/18    PT Start Time  3790    PT Stop Time  1404    PT Time Calculation (min)  56 min    Activity Tolerance  Patient tolerated treatment well    Behavior During Therapy  [facility] for tasks assessed/performed       Past Medical History:  Diagnosis Date  . Anemia   . Arthritis   . Chronic back pain    spondylolisthesis  . Glaucoma   . Headache   . History of blood transfusion    no abnormal reaction noted  . History of colon polyps    benign  . Hypothyroidism    takes NP Thyroid  . Perforated ulcer ([place])   . Peripheral neuropathy   . Pneumonia    hx of-yrs ago  . Primary localized osteoarthritis of left knee 04/14/2018    Past Surgical History:  Procedure Laterality Date  . ABDOMINAL HYSTERECTOMY  1973   still has one ovary  . ANTERIOR LAT LUMBAR FUSION N/A 02/28/2017   Procedure: Lumbar four-five Transpsoas lateral interbody fusion with Lumbar four-five Percutaneous pedicle screw fixation, posterolateral arthrodesis with  Mazor ;  Surgeon: Ditty, Kevan Ny, MD;  Location: [place];  Service: Neurosurgery;  Laterality: N/A;  . APPENDECTOMY  1965  . APPLICATION OF ROBOTIC ASSISTANCE FOR SPINAL PROCEDURE N/A 02/28/2017   Procedure: APPLICATION OF ROBOTIC ASSISTANCE FOR SPINAL PROCEDURE;  Surgeon: Ditty, Kevan Ny, MD;  Location: [place];  Service: Neurosurgery;  Laterality: N/A;  . cataract surgery Bilateral   . CESAREAN SECTION     x2, 1968, 1972  . COLON SURGERY  2018   hemi-colectomy resection  . COLONOSCOPY WITH ESOPHAGOGASTRODUODENOSCOPY (EGD)    . EPIDURAL BLOCK  INJECTION     several times  . ganglion cyst removed from throat    . JOINT REPLACEMENT    . LUMBAR PERCUTANEOUS PEDICLE SCREW 1 LEVEL N/A 02/28/2017   Procedure: LUMBAR PERCUTANEOUS PEDICLE SCREW LUMBAR FOUR-FIVE;  Surgeon: Ditty, Kevan Ny, MD;  Location: [place];  Service: Neurosurgery;  Laterality: N/A;  . OVARIAN CYST REMOVAL  1965  . PARTIAL KNEE ARTHROPLASTY Left 04/14/2018   Procedure: UNICOMPARTMENTAL KNEE;  Surgeon: Marchia Bond, MD;  Location: [place];  Service: Orthopedics;  Laterality: Left;  . REPLACEMENT UNICONDYLAR JOINT KNEE Left 04/14/2018  . TONSILLECTOMY AND ADENOIDECTOMY  1950    There were no vitals filed for this visit.  Subjective Assessment - 05/19/18 1328    Subjective  Patient reports that she feels she is doing great.  Still having pain at 3AM.  6/10 pain    Currently in Pain?  No/denies    Aggravating Factors   reports at night wakes her up at 3AM.    Pain Relieving Factors  ice helps         [facility] PT Assessment - 05/19/18 0001      AROM   Left Knee Extension  12    Left Knee Flexion  122  [place] Adult PT Treatment/Exercise - 05/19/18 0001      Knee/Hip Exercises: Aerobic   Elliptical  R=6 I=10 x 2 minutes    Recumbent Bike  level 0 x 6 minutes      Knee/Hip Exercises: Machines for Strengthening   Cybex Leg Press  20lb 3x10 tried left only iwht 20# "too much", then did without weight x 10      Knee/Hip Exercises: Supine   Short Arc Quad Sets  Left;3 sets;10 reps;Limitations    Short Arc Quad Sets Limitations  3#    Other Supine Knee/Hip Exercises  feet on ball bridges      Vasopneumatic   Number Minutes Vasopneumatic   10 minutes    Vasopnuematic Location   Knee    Vasopneumatic Pressure  Medium    Vasopneumatic Temperature   lowest       Manual Therapy   Manual Therapy  Passive ROM    Passive ROM  L knee flex and ext more focus on extension, passively in sitting and in supine               PT  Short Term Goals - 04/28/18 1224      PT SHORT TERM GOAL #1   Title  Independent with initial HEP.     Status  Achieved        PT Long Term Goals - 05/19/18 1352      PT LONG TERM GOAL #1   Title  Increase ROM of the left knee to 5-120 degrees flexion    Status  Partially Met      PT LONG TERM GOAL #2   Title  Pain no greater than 3/10     Status  Partially Met      PT LONG TERM GOAL #3   Title  walk without device    Status  Achieved      PT LONG TERM GOAL #4   Title  go step over step on stairs up and down    Status  Achieved            Plan - 05/19/18 1350    Clinical Impression Statement  Patient overall doing great, no device, no deviation, able to do stairs step over step up and down.  She had a great increase in flexion but actuall lost ROM for extension.  Much of the treatment today focused on extension.  Gave her low load long duration stretches at home to gain extension    PT Next Visit Plan  assure extension give more to do at gym if possible for her own exericses    Consulted and Agree with Plan of Care  Patient       Patient will benefit from skilled therapeutic intervention in order to improve the following deficits and impairments:  Abnormal gait, Decreased range of motion, Difficulty walking, Decreased activity tolerance, Pain, Decreased balance, Impaired flexibility, Decreased scar mobility, Decreased mobility, Decreased strength, Increased edema  Visit Diagnosis: Acute pain of left knee  Stiffness of left knee, not elsewhere classified  Difficulty in walking, not elsewhere classified  Localized edema     Problem List Patient Active Problem List   Diagnosis Date Noted  . PCP notes >>>>>>>>>>>>>> 05/14/2018  . Prediabetes 05/14/2018  . Menopausal and female climacteric states 05/14/2018  . Primary localized osteoarthritis of left knee 04/14/2018  . S/P knee replacement 04/14/2018  . Duodenal ulcer 04/10/2018  . Glaucoma 03/30/2018  .  GERD (gastroesophageal reflux disease) 03/30/2018  .  S/P right colectomy 04/23/2017  . Intractable headache 03/10/2017  . Gait instability 03/10/2017  . Generalized weakness 03/10/2017  . Leukocytosis 03/10/2017  . Hypothyroidism 03/10/2017  . Spondylolisthesis of lumbosacral region 02/28/2017  . S/P lumbar fusion 02/25/2017    Sumner Boast., PT 05/19/2018, 1:54 PM  [place], [place], [address] Phone: [phone]   Fax:  [phone]  Name: Ann Bell MRN: [id number] Date of Birth: Jan 20, 1946

## 2018-05-21 ENCOUNTER — Ambulatory Visit: Payer: Medicare HMO | Admitting: Physical Therapy

## 2018-05-21 ENCOUNTER — Encounter: Payer: Self-pay | Admitting: Internal Medicine

## 2018-05-21 DIAGNOSIS — M25562 Pain in left knee: Secondary | ICD-10-CM | POA: Diagnosis not present

## 2018-05-21 DIAGNOSIS — R600 Localized edema: Secondary | ICD-10-CM

## 2018-05-21 DIAGNOSIS — R262 Difficulty in walking, not elsewhere classified: Secondary | ICD-10-CM

## 2018-05-21 DIAGNOSIS — M25662 Stiffness of left knee, not elsewhere classified: Secondary | ICD-10-CM

## 2018-05-21 NOTE — Therapy (Signed)
[place] [address] [place], [place], [address] Phone: [phone]   Fax:  [phone]  Physical Therapy Treatment  Patient Details  Name: Ann Bell MRN: [id number] Date of Birth: Jun 04, 1946 Referring Provider: Mardelle Matte   Encounter Date: 05/21/2018  PT End of Session - 05/21/18 1106    Visit Number  5    Date for PT Re-Evaluation  06/20/18    PT Start Time  1106    PT Stop Time  1157    PT Time Calculation (min)  51 min    Activity Tolerance  Patient tolerated treatment well    Behavior During Therapy  [facility] for tasks assessed/performed       Past Medical History:  Diagnosis Date  . Anemia   . Arthritis   . Chronic back pain    spondylolisthesis  . Glaucoma   . Headache   . History of blood transfusion    no abnormal reaction noted  . History of colon polyps    benign  . Hypothyroidism    takes NP Thyroid  . Perforated ulcer ([place])   . Peripheral neuropathy   . Pneumonia    hx of-yrs ago  . Primary localized osteoarthritis of left knee 04/14/2018    Past Surgical History:  Procedure Laterality Date  . ABDOMINAL HYSTERECTOMY  1973   still has one ovary  . ANTERIOR LAT LUMBAR FUSION N/A 02/28/2017   Procedure: Lumbar four-five Transpsoas lateral interbody fusion with Lumbar four-five Percutaneous pedicle screw fixation, posterolateral arthrodesis with  Mazor ;  Surgeon: Ditty, Kevan Ny, MD;  Location: [place];  Service: Neurosurgery;  Laterality: N/A;  . APPENDECTOMY  1965  . APPLICATION OF ROBOTIC ASSISTANCE FOR SPINAL PROCEDURE N/A 02/28/2017   Procedure: APPLICATION OF ROBOTIC ASSISTANCE FOR SPINAL PROCEDURE;  Surgeon: Ditty, Kevan Ny, MD;  Location: [place];  Service: Neurosurgery;  Laterality: N/A;  . cataract surgery Bilateral   . CESAREAN SECTION     x2, 1968, 1972  . COLON SURGERY  2018   hemi-colectomy resection  . COLONOSCOPY WITH ESOPHAGOGASTRODUODENOSCOPY (EGD)    . EPIDURAL BLOCK  INJECTION     several times  . ganglion cyst removed from throat    . JOINT REPLACEMENT    . LUMBAR PERCUTANEOUS PEDICLE SCREW 1 LEVEL N/A 02/28/2017   Procedure: LUMBAR PERCUTANEOUS PEDICLE SCREW LUMBAR FOUR-FIVE;  Surgeon: Ditty, Kevan Ny, MD;  Location: [place];  Service: Neurosurgery;  Laterality: N/A;  . OVARIAN CYST REMOVAL  1965  . PARTIAL KNEE ARTHROPLASTY Left 04/14/2018   Procedure: UNICOMPARTMENTAL KNEE;  Surgeon: Marchia Bond, MD;  Location: [place];  Service: Orthopedics;  Laterality: Left;  . REPLACEMENT UNICONDYLAR JOINT KNEE Left 04/14/2018  . TONSILLECTOMY AND ADENOIDECTOMY  1950    There were no vitals filed for this visit.  Subjective Assessment - 05/21/18 1107    Subjective  Patient was standing up yesterday after her extension stretch and twisted the knee slightly. She had some pain and swelling returned.     Patient Stated Goals  have good ROM, walk without assistance, have no pain    Currently in Pain?  Yes    Pain Score  5     Pain Location  Knee    Pain Orientation  Left    Pain Descriptors / Indicators  Aching;Sore    Pain Type  Acute pain  [place] Adult PT Treatment/Exercise - 05/21/18 0001      Knee/Hip Exercises: Aerobic   Recumbent Bike  level 0 x 6 minutes adjusting       Knee/Hip Exercises: Machines for Strengthening   Cybex Knee Extension  5# 3x10    Cybex Knee Flexion  25# 3x10    Cybex Leg Press  20lb 3x10 tried left only iwht 20# "too much", then did without weight x 10      Knee/Hip Exercises: Supine   Short Arc Quad Sets  Strengthening;Left;10 reps;2 sets 5 sec hold    Short Arc Quad Sets Limitations  3#    Straight Leg Raises  Strengthening;Left;1 set;10 reps 3#    Straight Leg Raise with External Rotation  Strengthening;Left;1 set;10 reps no wt; difficult    Other Supine Knee/Hip Exercises  feet on ball bridges 2 x 10       Modalities   Modalities  Vasopneumatic      Vasopneumatic   Number  Minutes Vasopneumatic   10 minutes    Vasopnuematic Location   Knee    Vasopneumatic Pressure  Medium    Vasopneumatic Temperature   lowest      Manual Therapy   Manual Therapy  Passive ROM    Passive ROM  L knee extension               PT Short Term Goals - 04/28/18 1224      PT SHORT TERM GOAL #1   Title  Independent with initial HEP.     Status  Achieved        PT Long Term Goals - 05/19/18 1352      PT LONG TERM GOAL #1   Title  Increase ROM of the left knee to 5-120 degrees flexion    Status  Partially Met      PT LONG TERM GOAL #2   Title  Pain no greater than 3/10     Status  Partially Met      PT LONG TERM GOAL #3   Title  walk without device    Status  Achieved      PT LONG TERM GOAL #4   Title  go step over step on stairs up and down    Status  Achieved            Plan - 05/21/18 1146    Clinical Impression Statement  Patient did very well with TE despite reports of increased swelling and a little pain yesterday. Quad lag with STR with ER.    PT Treatment/Interventions  ADLs/Self Care Home Management;Cryotherapy;Advice worker;Therapeutic exercise;Therapeutic activities;Functional mobility training;Stair training;Patient/family education;Manual techniques;Vasopneumatic Device    PT Next Visit Plan  assure extension give more to do at gym if possible for her own exericses       Patient will benefit from skilled therapeutic intervention in order to improve the following deficits and impairments:  Abnormal gait, Decreased range of motion, Difficulty walking, Decreased activity tolerance, Pain, Decreased balance, Impaired flexibility, Decreased scar mobility, Decreased mobility, Decreased strength, Increased edema  Visit Diagnosis: Stiffness of left knee, not elsewhere classified  Acute pain of left knee  Difficulty in walking, not elsewhere classified  Localized edema     Problem List Patient Active Problem List    Diagnosis Date Noted  . PCP notes >>>>>>>>>>>>>> 05/14/2018  . Prediabetes 05/14/2018  . Menopausal and female climacteric states 05/14/2018  . Primary localized osteoarthritis of left knee 04/14/2018  . S/P knee  replacement 04/14/2018  . Duodenal ulcer 04/10/2018  . Glaucoma 03/30/2018  . GERD (gastroesophageal reflux disease) 03/30/2018  . S/P right colectomy 04/23/2017  . Intractable headache 03/10/2017  . Gait instability 03/10/2017  . Generalized weakness 03/10/2017  . Leukocytosis 03/10/2017  . Hypothyroidism 03/10/2017  . Spondylolisthesis of lumbosacral region 02/28/2017  . S/P lumbar fusion 02/25/2017    Donnamaria Shands PT 05/21/2018, 11:49 AM  [place], [place], [address] Phone: [phone]   Fax:  [phone]  Name: Ann Bell MRN: [id number] Date of Birth: 1946/07/01

## 2018-06-17 ENCOUNTER — Encounter: Payer: Medicare HMO | Admitting: Physical Therapy

## 2018-06-26 DIAGNOSIS — E569 Vitamin deficiency, unspecified: Secondary | ICD-10-CM | POA: Diagnosis not present

## 2018-06-26 DIAGNOSIS — E039 Hypothyroidism, unspecified: Secondary | ICD-10-CM | POA: Diagnosis not present

## 2018-06-26 DIAGNOSIS — R5382 Chronic fatigue, unspecified: Secondary | ICD-10-CM | POA: Diagnosis not present

## 2018-06-26 DIAGNOSIS — E559 Vitamin D deficiency, unspecified: Secondary | ICD-10-CM | POA: Diagnosis not present

## 2018-06-26 DIAGNOSIS — D510 Vitamin B12 deficiency anemia due to intrinsic factor deficiency: Secondary | ICD-10-CM | POA: Diagnosis not present

## 2018-06-26 DIAGNOSIS — R799 Abnormal finding of blood chemistry, unspecified: Secondary | ICD-10-CM | POA: Diagnosis not present

## 2018-07-24 DIAGNOSIS — E039 Hypothyroidism, unspecified: Secondary | ICD-10-CM | POA: Diagnosis not present

## 2018-07-24 DIAGNOSIS — E785 Hyperlipidemia, unspecified: Secondary | ICD-10-CM | POA: Diagnosis not present

## 2018-07-24 DIAGNOSIS — M545 Low back pain: Secondary | ICD-10-CM | POA: Diagnosis not present

## 2018-07-24 DIAGNOSIS — Z6822 Body mass index (BMI) 22.0-22.9, adult: Secondary | ICD-10-CM | POA: Diagnosis not present

## 2018-07-24 DIAGNOSIS — Z9841 Cataract extraction status, right eye: Secondary | ICD-10-CM | POA: Diagnosis not present

## 2018-07-24 DIAGNOSIS — H409 Unspecified glaucoma: Secondary | ICD-10-CM | POA: Diagnosis not present

## 2018-07-24 DIAGNOSIS — Z9842 Cataract extraction status, left eye: Secondary | ICD-10-CM | POA: Diagnosis not present

## 2018-07-24 DIAGNOSIS — Z96652 Presence of left artificial knee joint: Secondary | ICD-10-CM | POA: Diagnosis not present

## 2018-08-13 ENCOUNTER — Ambulatory Visit (INDEPENDENT_AMBULATORY_CARE_PROVIDER_SITE_OTHER): Payer: Medicare HMO | Admitting: Internal Medicine

## 2018-08-13 ENCOUNTER — Encounter (INDEPENDENT_AMBULATORY_CARE_PROVIDER_SITE_OTHER): Payer: Self-pay

## 2018-08-13 ENCOUNTER — Encounter: Payer: Self-pay | Admitting: Internal Medicine

## 2018-08-13 VITALS — BP 114/58 | HR 69 | Temp 98.0°F | Resp 16 | Ht 63.0 in | Wt 136.1 lb

## 2018-08-13 DIAGNOSIS — Z1159 Encounter for screening for other viral diseases: Secondary | ICD-10-CM

## 2018-08-13 DIAGNOSIS — R7303 Prediabetes: Secondary | ICD-10-CM

## 2018-08-13 DIAGNOSIS — E039 Hypothyroidism, unspecified: Secondary | ICD-10-CM

## 2018-08-13 DIAGNOSIS — Z23 Encounter for immunization: Secondary | ICD-10-CM

## 2018-08-13 LAB — HEPATITIS C ANTIBODY
Hepatitis C Ab: NONREACTIVE
SIGNAL TO CUT-OFF: 0.01 (ref <1.00)

## 2018-08-13 NOTE — Patient Instructions (Signed)
GO TO THE LAB : Get the blood work     GO TO THE FRONT DESK Schedule your next appointment for a  Check up in 1 year    

## 2018-08-13 NOTE — Progress Notes (Signed)
Pre visit review using our clinic review tool, if applicable. No additional management support is needed unless otherwise documented below in the visit note. 

## 2018-08-13 NOTE — Progress Notes (Signed)
Subjective:    Patient ID: Ann Bell, female    DOB: November 09, 1945, 72 y.o.   MRN: [id number]  DOS:  08/13/2018 Type of visit - description : f/u Interval history: She feels well, she brought me a number of labs done elsewhere.  Review of Systems Very active, exercises regularly, no chest pain no difficulty breathing.   Past Medical History:  Diagnosis Date  . Anemia   . Arthritis   . Chronic back pain    spondylolisthesis  . Glaucoma   . Headache   . History of blood transfusion    no abnormal reaction noted  . History of colon polyps    benign  . Hypothyroidism    takes NP Thyroid  . Perforated ulcer (HCC)   . Peripheral neuropathy   . Pneumonia    hx of-yrs ago  . Primary localized osteoarthritis of left knee 04/14/2018    Past Surgical History:  Procedure Laterality Date  . ABDOMINAL HYSTERECTOMY  1973   still has one ovary  . ANTERIOR LAT LUMBAR FUSION N/A 02/28/2017   Procedure: Lumbar four-five Transpsoas lateral interbody fusion with Lumbar four-five Percutaneous pedicle screw fixation, posterolateral arthrodesis with  Mazor ;  Surgeon: Ditty, Loura Halt, MD;  Location: [facility] OR;  Service: Neurosurgery;  Laterality: N/A;  . APPENDECTOMY  1965  . APPLICATION OF ROBOTIC ASSISTANCE FOR SPINAL PROCEDURE N/A 02/28/2017   Procedure: APPLICATION OF ROBOTIC ASSISTANCE FOR SPINAL PROCEDURE;  Surgeon: Ditty, Loura Halt, MD;  Location: [facility] OR;  Service: Neurosurgery;  Laterality: N/A;  . cataract surgery Bilateral   . CESAREAN SECTION     x2, 1968, 1972  . COLON SURGERY  2018   hemi-colectomy resection  . COLONOSCOPY WITH ESOPHAGOGASTRODUODENOSCOPY (EGD)    . EPIDURAL BLOCK INJECTION     several times  . ganglion cyst removed from throat    . JOINT REPLACEMENT    . LUMBAR PERCUTANEOUS PEDICLE SCREW 1 LEVEL N/A 02/28/2017   Procedure: LUMBAR PERCUTANEOUS PEDICLE SCREW LUMBAR FOUR-FIVE;  Surgeon: Ditty, Loura Halt, MD;  Location: [facility] OR;  Service: Neurosurgery;   Laterality: N/A;  . OVARIAN CYST REMOVAL  1965  . PARTIAL KNEE ARTHROPLASTY Left 04/14/2018   Procedure: UNICOMPARTMENTAL KNEE;  Surgeon: Ann Lucy, MD;  Location: [facility] OR;  Service: Orthopedics;  Laterality: Left;  . REPLACEMENT UNICONDYLAR JOINT KNEE Left 04/14/2018  . TONSILLECTOMY AND ADENOIDECTOMY  1950    Social History   Socioeconomic History  . Marital status: Married    Spouse name: Not on file  . Number of children: 2  . Years of education: Not on file  . Highest education level: Not on file  Occupational History  . Occupation: n/a  Social Needs  . Financial resource strain: Not on file  . Food insecurity:    Worry: Not on file    Inability: Not on file  . Transportation needs:    Medical: Not on file    Non-medical: Not on file  Tobacco Use  . Smoking status: Former Smoker    Packs/day: 0.50    Years: 20.00    Pack years: 10.00    Types: Cigarettes    Last attempt to quit: 06/07/1978    Years since quitting: 40.2  . Smokeless tobacco: Never Used  Substance and Sexual Activity  . Alcohol use: Yes    Comment: socially   . Drug use: No  . Sexual activity: Not Currently  Lifestyle  . Physical activity:    Days per week: Not  on file    Minutes per session: Not on file  . Stress: Not on file  Relationships  . Social connections:    Talks on phone: Not on file    Gets together: Not on file    Attends religious service: Not on file    Active member of club or organization: Not on file    Attends meetings of clubs or organizations: Not on file    Relationship status: Not on file  . Intimate partner violence:    Fear of current or ex partner: Not on file    Emotionally abused: Not on file    Physically abused: Not on file    Forced sexual activity: Not on file  Other Topics Concern  . Not on file  Social History Narrative   Lives w/ husband      Allergies as of 08/13/2018      Reactions   Codeine Nausea And Vomiting      Medication List          Accurate as of 08/13/18 11:59 PM. Always use your most recent med list.          aspirin EC 81 MG tablet Take 81 mg by mouth daily.   baclofen 10 MG tablet Commonly known as:  LIORESAL Take 1 tablet (10 mg total) by mouth 3 (three) times daily. As needed for muscle spasm   DHEA PO Take 1 capsule by mouth daily.   latanoprost 0.005 % ophthalmic solution Commonly known as:  XALATAN Place 1 drop into both eyes at bedtime.   multivitamin with minerals Tabs tablet Take 1 tablet by mouth daily.   NALTREXONE HCL PO Take 4.5 mg by mouth daily.   NP THYROID 60 MG tablet Generic drug:  thyroid Take 60 mg by mouth daily before breakfast.   ondansetron 4 MG tablet Commonly known as:  ZOFRAN Take 1 tablet (4 mg total) by mouth every 8 (eight) hours as needed for nausea or vomiting.   pantoprazole 40 MG tablet Commonly known as:  PROTONIX Take 40 mg by mouth every other day. In the morning.   progesterone 100 MG capsule Commonly known as:  PROMETRIUM Take 100 mg by mouth daily.   Testosterone 20 % Crea 0.25 mg by Does not apply route daily.          Objective:   Physical Exam BP (!) 114/58 (BP Location: Right Arm, Patient Position: Sitting, Cuff Size: Small)   Pulse 69   Temp 98 F (36.7 C) (Oral)   Resp 16   Ht 5\' 3"  (1.6 m)   Wt 136 lb 2 oz (61.7 kg)   SpO2 97%   BMI 24.11 kg/m  General:   Well developed, NAD, see BMI.  HEENT:  Normocephalic . Face symmetric, atraumatic Lungs:  CTA B Normal respiratory effort, no intercostal retractions, no accessory muscle use. Heart: RRR,  no murmur.  No pretibial edema bilaterally  Skin: Not pale. Not jaundice Neurologic:  alert & oriented X3.  Speech normal, gait appropriate for age and unassisted Psych--  Cognition and judgment appear intact.  Cooperative with normal attention span and concentration.  Behavior appropriate. No anxious or depressed appearing.      Assessment & Plan:   Assessment (new patient  04/2016, referred by Ann Bell). Prediabetes Hypothyroidism DJD Menopause  GI:  - GERD, constipation - 06-2016: Pneumoperitoneum due to perforated duodenal ulcer.  Nonsurgical treatment. - 11-2016: Epigastric pain, had a EGD and colonoscopy, + gastritis, negative H.  pylori, fundic gland polyps.  Mild duodenitis. Large tubular adenoma - Right colectomy 03/2017 Glaucoma Follows by Theodora Blow NP, a holistic nurse practitioner  PLAN: Labs from 05-18-2018 Hemoglobin A1c 5.9, potassium 4.2, creatinine 0.7, LFTs normal. C-reactive protein 15.93, elevated. Has a number of endocrine results, managed elsewhere, some abnormal. Vitamin D 56.  Total cholesterol 218, LDL 136 11/15/2016: T score -1.6.  Osteopenia.  Prediabetes, last A1c excellent.  No change Hypothyroidism: Managed elsewhere. Increase C-reactive protein: Explained pt this is a marker of cardiovascular risk, it is elevated, offered and recommend a cardiology evaluation, stress test?  Patient said she will discuss with Briles NP Some endocrine results are abnormal, offered a endocrine referral, states she will discuss with Briles NP. Preventive care: Td and pneumonia 23 today. Declined mammogram.  Reports female care is done by [facility] NP Check a hep C screening RTC 1 year

## 2018-08-14 NOTE — Assessment & Plan Note (Signed)
Labs from 05-18-2018 Hemoglobin A1c 5.9, potassium 4.2, creatinine 0.7, LFTs normal. C-reactive protein 15.93, elevated. Has a number of endocrine results, managed elsewhere, some abnormal. Vitamin D 56.  Total cholesterol 218, LDL 136 11/15/2016: T score -1.6.  Osteopenia.  Prediabetes, last A1c excellent.  No change Hypothyroidism: Managed elsewhere. Increase C-reactive protein: Explained pt this is a marker of cardiovascular risk, it is elevated, offered and recommend a cardiology evaluation, stress test?  Patient said she will discuss with Briles NP Some endocrine results are abnormal, offered a endocrine referral, states she will discuss with Briles NP. Preventive care: Td and pneumonia 23 today. Declined mammogram.  Reports female care is done by [facility] NP Check a hep C screening RTC 1 year

## 2018-08-24 DIAGNOSIS — M1712 Unilateral primary osteoarthritis, left knee: Secondary | ICD-10-CM | POA: Diagnosis not present

## 2018-10-08 DIAGNOSIS — Z961 Presence of intraocular lens: Secondary | ICD-10-CM | POA: Diagnosis not present

## 2018-10-08 DIAGNOSIS — H26493 Other secondary cataract, bilateral: Secondary | ICD-10-CM | POA: Diagnosis not present

## 2018-10-08 DIAGNOSIS — H00015 Hordeolum externum left lower eyelid: Secondary | ICD-10-CM | POA: Diagnosis not present

## 2018-10-08 DIAGNOSIS — H04123 Dry eye syndrome of bilateral lacrimal glands: Secondary | ICD-10-CM | POA: Diagnosis not present

## 2018-12-04 DIAGNOSIS — R7303 Prediabetes: Secondary | ICD-10-CM | POA: Diagnosis not present

## 2018-12-04 DIAGNOSIS — M2550 Pain in unspecified joint: Secondary | ICD-10-CM | POA: Diagnosis not present

## 2018-12-04 DIAGNOSIS — R5383 Other fatigue: Secondary | ICD-10-CM | POA: Diagnosis not present

## 2018-12-04 DIAGNOSIS — E568 Deficiency of other vitamins: Secondary | ICD-10-CM | POA: Diagnosis not present

## 2018-12-04 DIAGNOSIS — E039 Hypothyroidism, unspecified: Secondary | ICD-10-CM | POA: Diagnosis not present

## 2018-12-04 DIAGNOSIS — N951 Menopausal and female climacteric states: Secondary | ICD-10-CM | POA: Diagnosis not present

## 2019-04-08 DIAGNOSIS — Z961 Presence of intraocular lens: Secondary | ICD-10-CM | POA: Diagnosis not present

## 2019-04-08 DIAGNOSIS — H04123 Dry eye syndrome of bilateral lacrimal glands: Secondary | ICD-10-CM | POA: Diagnosis not present

## 2019-04-08 DIAGNOSIS — H401132 Primary open-angle glaucoma, bilateral, moderate stage: Secondary | ICD-10-CM | POA: Diagnosis not present

## 2019-04-08 DIAGNOSIS — H26493 Other secondary cataract, bilateral: Secondary | ICD-10-CM | POA: Diagnosis not present

## 2019-04-19 DIAGNOSIS — M5030 Other cervical disc degeneration, unspecified cervical region: Secondary | ICD-10-CM | POA: Diagnosis not present

## 2019-05-13 DIAGNOSIS — E568 Deficiency of other vitamins: Secondary | ICD-10-CM | POA: Diagnosis not present

## 2019-05-13 DIAGNOSIS — R5383 Other fatigue: Secondary | ICD-10-CM | POA: Diagnosis not present

## 2019-05-13 DIAGNOSIS — E559 Vitamin D deficiency, unspecified: Secondary | ICD-10-CM | POA: Diagnosis not present

## 2019-05-13 DIAGNOSIS — E039 Hypothyroidism, unspecified: Secondary | ICD-10-CM | POA: Diagnosis not present

## 2019-05-13 DIAGNOSIS — N951 Menopausal and female climacteric states: Secondary | ICD-10-CM | POA: Diagnosis not present

## 2019-05-13 DIAGNOSIS — R799 Abnormal finding of blood chemistry, unspecified: Secondary | ICD-10-CM | POA: Diagnosis not present

## 2019-05-13 LAB — CBC AND DIFFERENTIAL
Hemoglobin: 15.4 (ref 12.0–16.0)
Neutrophils Absolute: 4
Platelets: 221 (ref 150–399)
WBC: 8

## 2019-05-13 LAB — BASIC METABOLIC PANEL
BUN: 13 (ref 4–21)
Creatinine: 0.7 (ref 0.5–1.1)
Glucose: 106
Potassium: 4.5 (ref 3.4–5.3)
Sodium: 140 (ref 137–147)

## 2019-05-13 LAB — LIPID PANEL
Cholesterol: 232 — AB (ref 0–200)
HDL: 58 (ref 35–70)
LDL Cholesterol: 145
Triglycerides: 147 (ref 40–160)

## 2019-05-13 LAB — TSH: TSH: 0.01 — AB (ref 0.41–5.90)

## 2019-05-13 LAB — HEPATIC FUNCTION PANEL
ALT: 22 (ref 7–35)
AST: 24 (ref 13–35)
Alkaline Phosphatase: 57 (ref 25–125)
Bilirubin, Total: 0.5

## 2019-05-21 ENCOUNTER — Encounter: Payer: Self-pay | Admitting: Internal Medicine

## 2019-06-08 ENCOUNTER — Encounter: Payer: Self-pay | Admitting: Internal Medicine

## 2019-07-08 DIAGNOSIS — H01001 Unspecified blepharitis right upper eyelid: Secondary | ICD-10-CM | POA: Diagnosis not present

## 2019-07-08 DIAGNOSIS — H01002 Unspecified blepharitis right lower eyelid: Secondary | ICD-10-CM | POA: Diagnosis not present

## 2019-07-08 DIAGNOSIS — H26493 Other secondary cataract, bilateral: Secondary | ICD-10-CM | POA: Diagnosis not present

## 2019-07-08 DIAGNOSIS — H401132 Primary open-angle glaucoma, bilateral, moderate stage: Secondary | ICD-10-CM | POA: Diagnosis not present

## 2019-07-09 DIAGNOSIS — E039 Hypothyroidism, unspecified: Secondary | ICD-10-CM | POA: Diagnosis not present

## 2019-07-09 DIAGNOSIS — E8881 Metabolic syndrome: Secondary | ICD-10-CM | POA: Diagnosis not present

## 2019-07-09 DIAGNOSIS — R799 Abnormal finding of blood chemistry, unspecified: Secondary | ICD-10-CM | POA: Diagnosis not present

## 2019-07-09 DIAGNOSIS — D518 Other vitamin B12 deficiency anemias: Secondary | ICD-10-CM | POA: Diagnosis not present

## 2019-07-09 DIAGNOSIS — E559 Vitamin D deficiency, unspecified: Secondary | ICD-10-CM | POA: Diagnosis not present

## 2019-07-09 DIAGNOSIS — R5383 Other fatigue: Secondary | ICD-10-CM | POA: Diagnosis not present

## 2019-07-13 DIAGNOSIS — R030 Elevated blood-pressure reading, without diagnosis of hypertension: Secondary | ICD-10-CM | POA: Diagnosis not present

## 2019-07-13 DIAGNOSIS — M542 Cervicalgia: Secondary | ICD-10-CM | POA: Diagnosis not present

## 2019-07-13 DIAGNOSIS — M4316 Spondylolisthesis, lumbar region: Secondary | ICD-10-CM | POA: Diagnosis not present

## 2019-08-13 ENCOUNTER — Other Ambulatory Visit: Payer: Self-pay

## 2019-08-16 ENCOUNTER — Ambulatory Visit (INDEPENDENT_AMBULATORY_CARE_PROVIDER_SITE_OTHER): Payer: Medicare HMO | Admitting: Internal Medicine

## 2019-08-16 ENCOUNTER — Other Ambulatory Visit: Payer: Self-pay

## 2019-08-16 ENCOUNTER — Encounter: Payer: Self-pay | Admitting: Internal Medicine

## 2019-08-16 VITALS — BP 140/91 | HR 93 | Temp 96.9°F | Resp 16 | Ht 63.0 in | Wt 123.5 lb

## 2019-08-16 DIAGNOSIS — Z0000 Encounter for general adult medical examination without abnormal findings: Secondary | ICD-10-CM | POA: Insufficient documentation

## 2019-08-16 DIAGNOSIS — E039 Hypothyroidism, unspecified: Secondary | ICD-10-CM | POA: Diagnosis not present

## 2019-08-16 DIAGNOSIS — R7303 Prediabetes: Secondary | ICD-10-CM

## 2019-08-16 DIAGNOSIS — N951 Menopausal and female climacteric states: Secondary | ICD-10-CM | POA: Diagnosis not present

## 2019-08-16 NOTE — Progress Notes (Signed)
Pre visit review using our clinic review tool, if applicable. No additional management support is needed unless otherwise documented below in the visit note. 

## 2019-08-16 NOTE — Progress Notes (Signed)
Subjective:    Patient ID: Ann Bell, female    DOB: [birth year], 73 y.o.   MRN: [id number]  DOS:  08/16/2019 Type of visit - description: f/u  Reports she is doing great. Labs from other practitioners reviewed. She reports weight loss recently. Elevated BP: BP today is elevated, typically 120/70. Medication list reviewed, 6-week ago she started Trulicity.   Review of Systems  Denies fever chills No chest pain no difficulty breathing No nausea, vomiting, diarrhea. She remains extremely active Past Medical History:  Diagnosis Date  . Anemia   . Arthritis   . Chronic back pain    spondylolisthesis  . Glaucoma   . Headache   . History of blood transfusion    no abnormal reaction noted  . History of colon polyps    benign  . Hypothyroidism    takes NP Thyroid  . Perforated ulcer (HCC)   . Peripheral neuropathy   . Pneumonia    hx of-yrs ago  . Primary localized osteoarthritis of left knee 04/14/2018    Past Surgical History:  Procedure Laterality Date  . ABDOMINAL HYSTERECTOMY  1973   still has one ovary  . ANTERIOR LAT LUMBAR FUSION N/A 02/28/2017   Procedure: Lumbar four-five Transpsoas lateral interbody fusion with Lumbar four-five Percutaneous pedicle screw fixation, posterolateral arthrodesis with  Mazor ;  Surgeon: Ditty, Loura HaltBenjamin Jared, MD;  Location: [facility] OR;  Service: Neurosurgery;  Laterality: N/A;  . APPENDECTOMY  1965  . APPLICATION OF ROBOTIC ASSISTANCE FOR SPINAL PROCEDURE N/A 02/28/2017   Procedure: APPLICATION OF ROBOTIC ASSISTANCE FOR SPINAL PROCEDURE;  Surgeon: Ditty, Loura HaltBenjamin Jared, MD;  [facility];  Service: Neurosurgery;  Laterality: N/A;  . cataract surgery Bilateral   . CESAREAN SECTION     x2, 1968, 1972  . COLON SURGERY  2018   hemi-colectomy resection  . COLONOSCOPY WITH ESOPHAGOGASTRODUODENOSCOPY (EGD)    . EPIDURAL BLOCK INJECTION     several times  . ganglion cyst removed from throat    . JOINT REPLACEMENT    . LUMBAR PERCUTANEOUS  PEDICLE SCREW 1 LEVEL N/A 02/28/2017   Procedure: LUMBAR PERCUTANEOUS PEDICLE SCREW LUMBAR FOUR-FIVE;  Surgeon: Ditty, Loura HaltBenjamin Jared, MD;  Location: [facility] OR;  Service: Neurosurgery;  Laterality: N/A;  . OVARIAN CYST REMOVAL  1965  . PARTIAL KNEE ARTHROPLASTY Left 04/14/2018   Procedure: UNICOMPARTMENTAL KNEE;  Surgeon: Teryl LucyLandau, Joshua, MD;  [facility];  Service: Orthopedics;  Laterality: Left;  . REPLACEMENT UNICONDYLAR JOINT KNEE Left 04/14/2018  . TONSILLECTOMY AND ADENOIDECTOMY  1950    Social History   Socioeconomic History  . Marital status: Married    Spouse name: Not on file  . Number of children: 2  . Years of education: Not on file  . Highest education level: Not on file  Occupational History  . Occupation: n/a  Social Needs  . Financial resource strain: Not on file  . Food insecurity    Worry: Not on file    Inability: Not on file  . Transportation needs    Medical: Not on file    Non-medical: Not on file  Tobacco Use  . Smoking status: Former Smoker    Packs/day: 0.50    Years: 20.00    Pack years: 10.00    Types: Cigarettes    Quit date: 06/07/1978    Years since quitting: 41.2  . Smokeless tobacco: Never Used  Substance and Sexual Activity  . Alcohol use: Yes    Comment: socially   .  Drug use: No  . Sexual activity: Not Currently  Lifestyle  . Physical activity    Days per week: Not on file    Minutes per session: Not on file  . Stress: Not on file  Relationships  . Social Herbalist on phone: Not on file    Gets together: Not on file    Attends religious service: Not on file    Active member of club or organization: Not on file    Attends meetings of clubs or organizations: Not on file    Relationship status: Not on file  . Intimate partner violence    Fear of current or ex partner: Not on file    Emotionally abused: Not on file    Physically abused: Not on file    Forced sexual activity: Not on file  Other Topics Concern  . Not on  file  Social History Narrative   Lives w/ husband      Allergies as of 08/16/2019      Reactions   Codeine Nausea And Vomiting      Medication List       Accurate as of August 16, 2019 11:59 PM. If you have any questions, ask your nurse or doctor.        STOP taking these medications   baclofen 10 MG tablet Commonly known as: LIORESAL Stopped by: Kathlene November, MD   pantoprazole 40 MG tablet Commonly known as: PROTONIX Stopped by: Kathlene November, MD     TAKE these medications   aspirin EC 81 MG tablet Take 81 mg by mouth daily.   DHEA PO Take 1 capsule by mouth daily.   latanoprost 0.005 % ophthalmic solution Commonly known as: XALATAN Place 1 drop into both eyes at bedtime.   multivitamin with minerals Tabs tablet Take 1 tablet by mouth daily.   NALTREXONE HCL PO Take 4.5 mg by mouth daily.   NP Thyroid 60 MG tablet Generic drug: thyroid Take 60 mg by mouth daily before breakfast.   ondansetron 4 MG tablet Commonly known as: Zofran Take 1 tablet (4 mg total) by mouth every 8 (eight) hours as needed for nausea or vomiting.   progesterone 100 MG capsule Commonly known as: PROMETRIUM Take 100 mg by mouth daily.   Testosterone 20 % Crea 0.25 mg by Does not apply route daily.   Trulicity 1.5 EP/3.2RJ Sopn Generic drug: Dulaglutide Inject 1 Syringe into the skin once a week.           Objective:   Physical Exam BP (!) 140/91 (BP Location: Left Arm, Patient Position: Sitting, Cuff Size: Small)   Pulse 93   Temp (!) 96.9 F (36.1 C) (Temporal)   Resp 16   Ht 5\' 3"  (1.6 m)   Wt 123 lb 8 oz (56 kg)   SpO2 100%   BMI 21.88 kg/m   General: Well developed, NAD, BMI noted Neck: No  thyromegaly  HEENT:  Normocephalic . Face symmetric, atraumatic Lungs:  CTA B Normal respiratory effort, no intercostal retractions, no accessory muscle use. Heart: RRR,  no murmur.  No pretibial edema bilaterally  Abdomen:  Not distended, soft, non-tender. No rebound or  rigidity.   Skin: Exposed areas without rash. Not pale. Not jaundice Neurologic:  alert & oriented X3.  Speech normal, gait appropriate for age and unassisted Strength symmetric and appropriate for age.  Psych: Cognition and judgment appear intact.  Cooperative with normal attention span and concentration.  Behavior appropriate.  No anxious or depressed appearing.     Assessment     Assessment (new patient 04/2016, referred by [name]). Prediabetes Hypothyroidism DJD Menopause  GI:  - GERD, constipation - 06-2016: Pneumoperitoneum due to perforated duodenal ulcer.  Nonsurgical treatment. - 11-2016: Epigastric pain, had a EGD and colonoscopy, + gastritis, negative H. pylori, fundic gland polyps.  Mild duodenitis. Large tubular adenoma - Right colectomy 03/2017 Glaucoma Follows by Theodora Blow NP, a holistic nurse practitioner  PLAN: Labs from 05/13/2019: Potassium 4.5, liver test normal, creatinine 0.7. Total cholesterol 232, LDL 145. Hemoglobin 15.4 TSH 0.01 Prediabetes: 6 weeks ago was prescribed Trulicity, by another practitioner.  I do not see an A1c.  Advised patient that Trulicity has pros and cons and is indicated only for diabetes defined as a A1c of more than 6.5. Hypothyroidism: Last TSH suppressed, advised patient that she is taking excessive amounts of thyroid, again she will discuss that with her other practitioner. Menopausal: Taking Prometrium, testosterone.  I clearly told the patient that I am not in charge of her HRT and advised her to call me if she ever likes to see endocrinologist.  She is aware I do not support HRT in that manner. CPX: Request not to do a physical exam, just a follow-up, her physical exam is done by Lovell Sheehan, NP.  Nevertheless, data was reviewed RTC 1 year

## 2019-08-16 NOTE — Patient Instructions (Addendum)
Please schedule Medicare Wellness with Glenard Haring.   GO TO THE FRONT DESK Schedule your next appointment   In 1 year   Check the  blood pressure 2 or 3 times a month   BP GOAL is between 110/65 and  135/85. If it is consistently higher or lower, let me know

## 2019-08-17 NOTE — Assessment & Plan Note (Signed)
This is not a physical exam but data was reviewed: Td 2019 PNM 13: 2018 PNM 23 2019 Zoster 2017 shingrix 05/2019 Flu shot : had one  Female care : done elsewhere ; reports not further PAPs MMG: yearly per pt , no records  Bone density 10-2016: T score -1.6 Colonoscopy: 2018, next per GI

## 2019-08-17 NOTE — Assessment & Plan Note (Signed)
Labs from 05/13/2019: Potassium 4.5, liver test normal, creatinine 0.7. Total cholesterol 232, LDL 145. Hemoglobin 15.4 TSH 0.01 Prediabetes: 6 weeks ago was prescribed Trulicity, by another practitioner.  I do not see an A1c.  Advised patient that Trulicity has pros and cons and is indicated only for diabetes defined as a A1c of more than 6.5. Hypothyroidism: Last TSH suppressed, advised patient that she is taking excessive amounts of thyroid, again she will discuss that with her other practitioner. Menopausal: Taking Prometrium, testosterone.  I clearly told the patient that I am not in charge of her HRT and advised her to call me if she ever likes to see endocrinologist.  She is aware I do not support HRT in that manner. CPX: Request not to do a physical exam, just a follow-up, her physical exam is done by Bethel Born, NP.  Nevertheless, data was reviewed RTC 1 year

## 2019-08-20 DIAGNOSIS — H11441 Conjunctival cysts, right eye: Secondary | ICD-10-CM | POA: Diagnosis not present

## 2019-08-20 DIAGNOSIS — H26493 Other secondary cataract, bilateral: Secondary | ICD-10-CM | POA: Diagnosis not present

## 2019-08-20 DIAGNOSIS — H401132 Primary open-angle glaucoma, bilateral, moderate stage: Secondary | ICD-10-CM | POA: Diagnosis not present

## 2019-08-20 DIAGNOSIS — Z961 Presence of intraocular lens: Secondary | ICD-10-CM | POA: Diagnosis not present

## 2019-09-01 DIAGNOSIS — M5030 Other cervical disc degeneration, unspecified cervical region: Secondary | ICD-10-CM | POA: Diagnosis not present

## 2019-09-06 DIAGNOSIS — M542 Cervicalgia: Secondary | ICD-10-CM | POA: Diagnosis not present

## 2019-09-13 DIAGNOSIS — M47812 Spondylosis without myelopathy or radiculopathy, cervical region: Secondary | ICD-10-CM | POA: Insufficient documentation

## 2019-09-15 DIAGNOSIS — M542 Cervicalgia: Secondary | ICD-10-CM | POA: Diagnosis not present

## 2019-09-15 DIAGNOSIS — M47812 Spondylosis without myelopathy or radiculopathy, cervical region: Secondary | ICD-10-CM | POA: Diagnosis not present

## 2019-10-01 DIAGNOSIS — M47812 Spondylosis without myelopathy or radiculopathy, cervical region: Secondary | ICD-10-CM | POA: Diagnosis not present

## 2019-10-01 DIAGNOSIS — M542 Cervicalgia: Secondary | ICD-10-CM | POA: Diagnosis not present

## 2019-10-14 DIAGNOSIS — M47812 Spondylosis without myelopathy or radiculopathy, cervical region: Secondary | ICD-10-CM | POA: Diagnosis not present

## 2019-10-14 DIAGNOSIS — M5030 Other cervical disc degeneration, unspecified cervical region: Secondary | ICD-10-CM | POA: Diagnosis not present

## 2019-10-14 DIAGNOSIS — M542 Cervicalgia: Secondary | ICD-10-CM | POA: Diagnosis not present

## 2019-11-26 ENCOUNTER — Ambulatory Visit: Payer: Medicare HMO

## 2019-12-04 ENCOUNTER — Ambulatory Visit: Payer: Medicare HMO | Attending: Internal Medicine

## 2019-12-04 DIAGNOSIS — Z23 Encounter for immunization: Secondary | ICD-10-CM | POA: Insufficient documentation

## 2019-12-04 NOTE — Progress Notes (Signed)
   Covid-19 Vaccination Clinic  Name:  Ann Bell    MRN: [id number] DOB: Apr 15, 1946  12/04/2019  Ms. Want was observed post Covid-19 immunization for 15 minutes without incidence. She was provided with Vaccine Information Sheet and instruction to access the V-Safe system.   [name] was instructed to call 911 with any severe reactions post vaccine: Marland Kitchen Difficulty breathing  . Swelling of your face and throat  . A fast heartbeat  . A bad rash all over your body  . Dizziness and weakness    Immunizations Administered    Name Date Dose VIS Date Route   Pfizer COVID-19 Vaccine 12/04/2019 11:10 AM 0.3 mL 10/08/2019 Intramuscular   Manufacturer: [employer], [employer]   Lot: [id number]   NDC: [address]-4

## 2019-12-14 DIAGNOSIS — R799 Abnormal finding of blood chemistry, unspecified: Secondary | ICD-10-CM | POA: Diagnosis not present

## 2019-12-14 DIAGNOSIS — R7303 Prediabetes: Secondary | ICD-10-CM | POA: Diagnosis not present

## 2019-12-14 DIAGNOSIS — E039 Hypothyroidism, unspecified: Secondary | ICD-10-CM | POA: Diagnosis not present

## 2019-12-14 DIAGNOSIS — Z780 Asymptomatic menopausal state: Secondary | ICD-10-CM | POA: Diagnosis not present

## 2019-12-14 DIAGNOSIS — L309 Dermatitis, unspecified: Secondary | ICD-10-CM | POA: Diagnosis not present

## 2019-12-14 DIAGNOSIS — E785 Hyperlipidemia, unspecified: Secondary | ICD-10-CM | POA: Diagnosis not present

## 2019-12-14 DIAGNOSIS — R5382 Chronic fatigue, unspecified: Secondary | ICD-10-CM | POA: Diagnosis not present

## 2019-12-14 DIAGNOSIS — M2550 Pain in unspecified joint: Secondary | ICD-10-CM | POA: Diagnosis not present

## 2019-12-27 DIAGNOSIS — R197 Diarrhea, unspecified: Secondary | ICD-10-CM | POA: Diagnosis not present

## 2019-12-27 DIAGNOSIS — M2550 Pain in unspecified joint: Secondary | ICD-10-CM | POA: Diagnosis not present

## 2019-12-27 DIAGNOSIS — R7303 Prediabetes: Secondary | ICD-10-CM | POA: Diagnosis not present

## 2019-12-27 DIAGNOSIS — L309 Dermatitis, unspecified: Secondary | ICD-10-CM | POA: Diagnosis not present

## 2019-12-27 DIAGNOSIS — R785 Finding of other psychotropic drug in blood: Secondary | ICD-10-CM | POA: Diagnosis not present

## 2019-12-27 DIAGNOSIS — E8881 Metabolic syndrome: Secondary | ICD-10-CM | POA: Diagnosis not present

## 2019-12-27 DIAGNOSIS — E790 Hyperuricemia without signs of inflammatory arthritis and tophaceous disease: Secondary | ICD-10-CM | POA: Diagnosis not present

## 2019-12-27 DIAGNOSIS — E039 Hypothyroidism, unspecified: Secondary | ICD-10-CM | POA: Diagnosis not present

## 2019-12-27 DIAGNOSIS — E785 Hyperlipidemia, unspecified: Secondary | ICD-10-CM | POA: Diagnosis not present

## 2019-12-27 DIAGNOSIS — Z780 Asymptomatic menopausal state: Secondary | ICD-10-CM | POA: Diagnosis not present

## 2019-12-28 ENCOUNTER — Ambulatory Visit: Payer: Medicare HMO

## 2019-12-28 ENCOUNTER — Ambulatory Visit: Payer: Medicare HMO | Attending: Internal Medicine

## 2019-12-28 DIAGNOSIS — Z23 Encounter for immunization: Secondary | ICD-10-CM | POA: Insufficient documentation

## 2019-12-28 NOTE — Progress Notes (Signed)
   Covid-19 Vaccination Clinic  Name:  JAMELL OPFER    MRN: [id number] DOB: January 17, 1946  12/28/2019  Ms. Switalski was observed post Covid-19 immunization for 15 minutes without incident. She was provided with Vaccine Information Sheet and instruction to access the V-Safe system.   Ms. Timmons was instructed to call 911 with any severe reactions post vaccine: Marland Kitchen Difficulty breathing  . Swelling of face and throat  . A fast heartbeat  . A bad rash all over body  . Dizziness and weakness   Immunizations Administered    Name Date Dose VIS Date Route   Pfizer COVID-19 Vaccine 12/28/2019  9:03 AM 0.3 mL 10/08/2019 Intramuscular   Manufacturer: [employer], [employer]   Lot: [id number]   NDC: [address]-2

## 2019-12-31 DIAGNOSIS — H11441 Conjunctival cysts, right eye: Secondary | ICD-10-CM | POA: Diagnosis not present

## 2019-12-31 DIAGNOSIS — H401132 Primary open-angle glaucoma, bilateral, moderate stage: Secondary | ICD-10-CM | POA: Diagnosis not present

## 2019-12-31 DIAGNOSIS — H26493 Other secondary cataract, bilateral: Secondary | ICD-10-CM | POA: Diagnosis not present

## 2019-12-31 DIAGNOSIS — H0100A Unspecified blepharitis right eye, upper and lower eyelids: Secondary | ICD-10-CM | POA: Diagnosis not present

## 2020-02-03 DIAGNOSIS — E785 Hyperlipidemia, unspecified: Secondary | ICD-10-CM | POA: Diagnosis not present

## 2020-02-03 DIAGNOSIS — R5383 Other fatigue: Secondary | ICD-10-CM | POA: Diagnosis not present

## 2020-02-03 DIAGNOSIS — E039 Hypothyroidism, unspecified: Secondary | ICD-10-CM | POA: Diagnosis not present

## 2020-02-03 DIAGNOSIS — E568 Deficiency of other vitamins: Secondary | ICD-10-CM | POA: Diagnosis not present

## 2020-05-11 DIAGNOSIS — H401132 Primary open-angle glaucoma, bilateral, moderate stage: Secondary | ICD-10-CM | POA: Diagnosis not present

## 2020-06-29 ENCOUNTER — Telehealth: Payer: Self-pay | Admitting: Internal Medicine

## 2020-06-29 DIAGNOSIS — E039 Hypothyroidism, unspecified: Secondary | ICD-10-CM

## 2020-06-29 NOTE — Telephone Encounter (Signed)
Pt came in office stating is wanting a referral with End[facility]ist ([facility] at our office) due to tyroid issues, stating that her lab results number for tyroid results are high. Please advise. (pt tel [phone])

## 2020-06-29 NOTE — Telephone Encounter (Signed)
Referral placed.

## 2020-07-27 DIAGNOSIS — R7303 Prediabetes: Secondary | ICD-10-CM | POA: Diagnosis not present

## 2020-07-27 DIAGNOSIS — E039 Hypothyroidism, unspecified: Secondary | ICD-10-CM | POA: Diagnosis not present

## 2020-07-27 DIAGNOSIS — M2550 Pain in unspecified joint: Secondary | ICD-10-CM | POA: Diagnosis not present

## 2020-07-27 DIAGNOSIS — R5383 Other fatigue: Secondary | ICD-10-CM | POA: Diagnosis not present

## 2020-07-27 DIAGNOSIS — Z780 Asymptomatic menopausal state: Secondary | ICD-10-CM | POA: Diagnosis not present

## 2020-07-27 DIAGNOSIS — E785 Hyperlipidemia, unspecified: Secondary | ICD-10-CM | POA: Diagnosis not present

## 2020-09-01 DIAGNOSIS — E039 Hypothyroidism, unspecified: Secondary | ICD-10-CM | POA: Diagnosis not present

## 2020-09-08 DIAGNOSIS — H26493 Other secondary cataract, bilateral: Secondary | ICD-10-CM | POA: Diagnosis not present

## 2020-09-08 DIAGNOSIS — K5090 Crohn's disease, unspecified, without complications: Secondary | ICD-10-CM | POA: Diagnosis not present

## 2020-09-08 DIAGNOSIS — H04123 Dry eye syndrome of bilateral lacrimal glands: Secondary | ICD-10-CM | POA: Diagnosis not present

## 2020-09-08 DIAGNOSIS — H11441 Conjunctival cysts, right eye: Secondary | ICD-10-CM | POA: Diagnosis not present

## 2020-09-08 DIAGNOSIS — H401132 Primary open-angle glaucoma, bilateral, moderate stage: Secondary | ICD-10-CM | POA: Diagnosis not present

## 2020-09-12 ENCOUNTER — Encounter: Payer: Self-pay | Admitting: Internal Medicine

## 2020-09-12 ENCOUNTER — Ambulatory Visit: Payer: Medicare HMO | Admitting: Internal Medicine

## 2020-09-12 ENCOUNTER — Other Ambulatory Visit: Payer: Self-pay

## 2020-09-12 VITALS — BP 116/70 | HR 66 | Temp 98.5°F | Resp 12 | Ht 63.0 in | Wt 127.0 lb

## 2020-09-12 DIAGNOSIS — E039 Hypothyroidism, unspecified: Secondary | ICD-10-CM

## 2020-09-12 MED ORDER — THYROID 60 MG PO TABS: 60.0000 mg | ORAL_TABLET | Freq: Every day | ORAL | 6 refills | Status: DC

## 2020-09-12 NOTE — Progress Notes (Signed)
Name: Ann Bell  MRN/ DOB: [id number], December 22, 1945    Age/ Sex: 74 y.o., female    PCP: Ann Plump, MD   Reason for Endocrinology Evaluation: Hypothyroidism     Date of Initial Endocrinology Evaluation: 09/12/2020     HPI: [name] is a 74 y.o. female with a past medical history of Hypothyroidism. The patient presented for initial endocrinology clinic visit on 09/12/2020 for consultative assistance with her hypothyroidism .   Pt follows with a Holistic doctor and is currently on NP thyroid, testosterone cream and progesterone as well as Naltrexone .    She has been diagnosed with hypothyroidism years ago. She denies prior to surgery or radiation exposure   She denies fatigue  She stays active Denies diarrhea, palpitations or tremors  Denies local neck symptoms.   She is on NP thyroid 90 mg daily     HISTORY:  Past Medical History:  Past Medical History:  Diagnosis Date  . Anemia   . Arthritis   . Chronic back pain    spondylolisthesis  . Glaucoma   . Headache   . History of blood transfusion    no abnormal reaction noted  . History of colon polyps    benign  . Hypothyroidism    takes NP Thyroid  . Perforated ulcer (HCC)   . Peripheral neuropathy   . Pneumonia    hx of-yrs ago  . Primary localized osteoarthritis of left knee 04/14/2018   Past Surgical History:  Past Surgical History:  Procedure Laterality Date  . ABDOMINAL HYSTERECTOMY  1973   still has one ovary  . ANTERIOR LAT LUMBAR FUSION N/A 02/28/2017   Procedure: Lumbar four-five Transpsoas lateral interbody fusion with Lumbar four-five Percutaneous pedicle screw fixation, posterolateral arthrodesis with  Mazor ;  Surgeon: Ditty, Loura Halt, MD;  Location: [facility] OR;  Service: Neurosurgery;  Laterality: N/A;  . APPENDECTOMY  1965  . APPLICATION OF ROBOTIC ASSISTANCE FOR SPINAL PROCEDURE N/A 02/28/2017   Procedure: APPLICATION OF ROBOTIC ASSISTANCE FOR SPINAL PROCEDURE;  Surgeon: Ditty,  Loura Halt, MD;  Location: [facility] OR;  Service: Neurosurgery;  Laterality: N/A;  . cataract surgery Bilateral   . CESAREAN SECTION     x2, 1968, 1972  . COLON SURGERY  2018   hemi-colectomy resection  . COLONOSCOPY WITH ESOPHAGOGASTRODUODENOSCOPY (EGD)    . EPIDURAL BLOCK INJECTION     several times  . ganglion cyst removed from throat    . JOINT REPLACEMENT    . LUMBAR PERCUTANEOUS PEDICLE SCREW 1 LEVEL N/A 02/28/2017   Procedure: LUMBAR PERCUTANEOUS PEDICLE SCREW LUMBAR FOUR-FIVE;  Surgeon: Ditty, Loura Halt, MD;  Location: [facility] OR;  Service: Neurosurgery;  Laterality: N/A;  . OVARIAN CYST REMOVAL  1965  . PARTIAL KNEE ARTHROPLASTY Left 04/14/2018   Procedure: UNICOMPARTMENTAL KNEE;  Surgeon: Teryl Lucy, MD;  Location: [facility] OR;  Service: Orthopedics;  Laterality: Left;  . REPLACEMENT UNICONDYLAR JOINT KNEE Left 04/14/2018  . TONSILLECTOMY AND ADENOIDECTOMY  1950    Social History:  reports that she quit smoking about 42 years ago. Her smoking use included cigarettes. She has a 10.00 pack-year smoking history. She has never used smokeless tobacco. She reports current alcohol use. She reports that she does not use drugs. Family History: family history includes Breast cancer in her maternal grandmother; Diabetes in her mother; Hearing loss in her father; Liver disease in her paternal grandfather.   HOME MEDICATIONS: Allergies as of 09/12/2020      Reactions  Codeine Nausea And Vomiting      Medication List       Accurate as of September 12, 2020 10:47 AM. If you have any questions, ask your nurse or doctor.        STOP taking these medications   aspirin EC 81 MG tablet Stopped by: Scarlette Shorts, MD   ondansetron 4 MG tablet Commonly known as: Zofran Stopped by: Scarlette Shorts, MD   Trulicity 1.5 MG/0.5ML Sopn Generic drug: Dulaglutide Stopped by: Scarlette Shorts, MD     TAKE these medications   DHEA PO Take 1 capsule by mouth daily.   latanoprost  0.005 % ophthalmic solution Commonly known as: XALATAN Place 1 drop into both eyes at bedtime.   multivitamin with minerals Tabs tablet Take 1 tablet by mouth daily.   NALTREXONE HCL PO Take 4.5 mg by mouth daily.   NP Thyroid 60 MG tablet Generic drug: thyroid Take 60 mg by mouth daily before breakfast.   progesterone 100 MG capsule Commonly known as: PROMETRIUM Take 100 mg by mouth daily.   Testosterone 20 % Crea 0.25 mg by Does not apply route daily.         REVIEW OF SYSTEMS: A comprehensive ROS was conducted with the patient and is negative except as per HPI    OBJECTIVE:  VS: BP 116/70 (BP Location: Right Arm, Cuff Size: Normal)   Pulse 66   Temp 98.5 F (36.9 C) (Oral)   Resp 12   Ht 5\' 3"  (1.6 m)   Wt 127 lb (57.6 kg)   SpO2 95%   BMI 22.50 kg/m    Wt Readings from Last 3 Encounters:  09/12/20 127 lb (57.6 kg)  08/16/19 123 lb 8 oz (56 kg)  08/13/18 136 lb 2 oz (61.7 kg)     EXAM: General: Pt appears well and is in NAD  Neck: General: Supple without adenopathy. Thyroid: Thyroid size normal.  No goiter or nodules appreciated. No thyroid bruit.  Lungs: Clear with good BS bilat with no rales, rhonchi, or wheezes  Heart: Auscultation: RRR.  Abdomen: Normoactive bowel sounds, soft, nontender, without masses or organomegaly palpable  Extremities:  BL LE: No pretibial edema normal ROM and strength.  Skin: Hair: Texture and amount normal with gender appropriate distribution Skin Inspection: No rashes Skin Palpation: Skin temperature, texture, and thickness normal to palpation  Neuro: Cranial nerves: II - XII grossly intact  Motor: Normal strength throughout DTRs: 2+ and symmetric in UE without delay in relaxation phase  Mental Status: Judgment, insight: Intact Orientation: Oriented to time, place, and person Mood and affect: No depression, anxiety, or agitation     DATA REVIEWED:   Results for [name], [name] (MRN Ricky Ala) as of 09/13/2020  10:42  Ref. Range 05/13/2019 00:00  TSH Latest Ref Range: 0.41 - 5.90  0.01 (A)    ASSESSMENT/PLAN/RECOMMENDATIONS:   Hypothyroidism :   - She is clinically euthyroid  - She had labs through [facility]  ~1.5 weeks ago , these are not available but she tells me she has low TSH for a whole.  - I have given her the option of switching to levothyroxine vs continuing on NP thyroid and she would like to remain on the latter. We discussed increased risk of cardiac arrhythmia as well as increased bone resorption with low TSH.  Medications : Decrease NP thyroid to 60 mg daily    Labs in 6 weeks Follow-up in 3 months    Signed electronically  by: Lyndle Herrlich, MD  [facility] Endocrinology  [facility] [address] [place] [place], [place] [address] Phone: [phone] FAX: [phone]   CC: Ann Plump, MD [address] [place] [address] Phone: [phone] Fax: [phone]   Return to Endocrinology clinic as below: No future appointments.

## 2020-09-12 NOTE — Patient Instructions (Signed)
-   Decrease NP thyroid to 60 mg daily

## 2020-09-13 ENCOUNTER — Ambulatory Visit: Payer: Medicare HMO | Admitting: Internal Medicine

## 2020-09-20 DIAGNOSIS — E039 Hypothyroidism, unspecified: Secondary | ICD-10-CM | POA: Diagnosis not present

## 2020-10-03 DIAGNOSIS — Z981 Arthrodesis status: Secondary | ICD-10-CM | POA: Diagnosis not present

## 2020-10-24 ENCOUNTER — Other Ambulatory Visit (INDEPENDENT_AMBULATORY_CARE_PROVIDER_SITE_OTHER): Payer: Medicare HMO

## 2020-10-24 ENCOUNTER — Other Ambulatory Visit: Payer: Self-pay

## 2020-10-24 DIAGNOSIS — E039 Hypothyroidism, unspecified: Secondary | ICD-10-CM | POA: Diagnosis not present

## 2020-10-24 LAB — TSH: TSH: 2.24 u[IU]/mL (ref 0.35–4.50)

## 2020-11-02 DIAGNOSIS — H401132 Primary open-angle glaucoma, bilateral, moderate stage: Secondary | ICD-10-CM | POA: Diagnosis not present

## 2020-11-02 DIAGNOSIS — H11441 Conjunctival cysts, right eye: Secondary | ICD-10-CM | POA: Diagnosis not present

## 2020-11-02 DIAGNOSIS — H26493 Other secondary cataract, bilateral: Secondary | ICD-10-CM | POA: Diagnosis not present

## 2020-12-18 NOTE — Progress Notes (Signed)
Name: Ann Bell  MRN/ DOB: [id number], 12-31-1945    Age/ Sex: 75 y.o., female     PCP: Wanda Plump, MD   Reason for Endocrinology Evaluation: Hypothyroidism     Initial Endocrinology Clinic Visit: 09/12/2020    PATIENT IDENTIFIER: Ann Bell is a 75 y.o., female with a past medical history of hypothyroidism. She has followed with [facility] since 09/12/2020 for consultative assistance with management of her hypothyroidism.   HISTORICAL SUMMARY: T Pt follows with a Holistic doctor and is currently on NP thyroid, testosterone cream and progesterone as well as Naltrexone .  She has been diagnosed with hypothyroidism years ago. She denies prior to surgery or radiation exposure . With a TSH suppressed at < 0.01 uIU/Ml , we reduced NP thyroid dose      SUBJECTIVE:     Today (12/19/2020):  Ann Bell is here for a follow up on hypothyroidism   Weight has been stable   She stays active Denies diarrhea, palpitations or tremors  Denies local neck symptoms.    She is on NP thyroid 60 mg daily      HISTORY:  Past Medical History:  Past Medical History:  Diagnosis Date   Anemia    Arthritis    Chronic back pain    spondylolisthesis   Glaucoma    Headache    History of blood transfusion    no abnormal reaction noted   History of colon polyps    benign   Hypothyroidism    takes NP Thyroid   Perforated ulcer (HCC)    Peripheral neuropathy    Pneumonia    hx of-yrs ago   Primary localized osteoarthritis of left knee 04/14/2018   Past Surgical History:  Past Surgical History:  Procedure Laterality Date   ABDOMINAL HYSTERECTOMY  1973   still has one ovary   ANTERIOR LAT LUMBAR FUSION N/A 02/28/2017   Procedure: Lumbar four-five Transpsoas lateral interbody fusion with Lumbar four-five Percutaneous pedicle screw fixation, posterolateral arthrodesis with  Mazor ;  Surgeon: Ditty, Loura Halt, MD;  Location: [facility] OR;  Service:  Neurosurgery;  Laterality: N/A;   APPENDECTOMY  1965   APPLICATION OF ROBOTIC ASSISTANCE FOR SPINAL PROCEDURE N/A 02/28/2017   Procedure: APPLICATION OF ROBOTIC ASSISTANCE FOR SPINAL PROCEDURE;  Surgeon: Ditty, Loura Halt, MD;  Location: [facility] OR;  Service: Neurosurgery;  Laterality: N/A;   cataract surgery Bilateral    CESAREAN SECTION     x2, 1968, 1972   COLON SURGERY  2018   hemi-colectomy resection   COLONOSCOPY WITH ESOPHAGOGASTRODUODENOSCOPY (EGD)     EPIDURAL BLOCK INJECTION     several times   ganglion cyst removed from throat     JOINT REPLACEMENT     LUMBAR PERCUTANEOUS PEDICLE SCREW 1 LEVEL N/A 02/28/2017   Procedure: LUMBAR PERCUTANEOUS PEDICLE SCREW LUMBAR FOUR-FIVE;  Surgeon: Ditty, Loura Halt, MD;  Location: [facility] OR;  Service: Neurosurgery;  Laterality: N/A;   OVARIAN CYST REMOVAL  1965   PARTIAL KNEE ARTHROPLASTY Left 04/14/2018   Procedure: UNICOMPARTMENTAL KNEE;  Surgeon: Teryl Lucy, MD;  Location: [facility] OR;  Service: Orthopedics;  Laterality: Left;   REPLACEMENT UNICONDYLAR JOINT KNEE Left 04/14/2018   TONSILLECTOMY AND ADENOIDECTOMY  1950   Social History:  reports that she quit smoking about 42 years ago. Her smoking use included cigarettes. She has a 10.00 pack-year smoking history. She has never used smokeless tobacco. She reports current alcohol use. She reports that she does not use  drugs. Family History:  Family History  Problem Relation Age of Onset   Diabetes Mother    Breast cancer Maternal Grandmother    Liver disease Paternal Grandfather    Hearing loss Father    Colon cancer Neg Hx    CAD Neg Hx      HOME MEDICATIONS: Allergies as of 12/19/2020   No Known Allergies     Medication List       Accurate as of December 19, 2020  4:30 PM. If you have any questions, ask your nurse or doctor.        DHEA PO Take 1 capsule by mouth daily.   latanoprost 0.005 % ophthalmic solution Commonly known as: XALATAN Place 1 drop into  both eyes at bedtime.   multivitamin with minerals Tabs tablet Take 1 tablet by mouth daily.   NALTREXONE HCL PO Take 4.5 mg by mouth daily.   progesterone 100 MG capsule Commonly known as: PROMETRIUM Take 100 mg by mouth daily.   Testosterone 20 % Crea 0.25 mg by Does not apply route daily.   thyroid 60 MG tablet Commonly known as: NP Thyroid Take 1 tablet (60 mg total) by mouth daily before breakfast.         OBJECTIVE:   PHYSICAL EXAM: VS: BP 118/72    Pulse 70    Ht 5\' 3"  (1.6 m)    Wt 129 lb 8 oz (58.7 kg)    SpO2 92%    BMI 22.94 kg/m    EXAM: General: Pt appears well and is in NAD  Neck: General: Supple without adenopathy. Thyroid: Thyroid size normal.  No goiter or nodules appreciated.  Lungs: Clear with good BS bilat with no rales, rhonchi, or wheezes  Heart: Auscultation: RRR.  Abdomen: Normoactive bowel sounds, soft, nontender, without masses or organomegaly palpable  Extremities:  BL LE: No pretibial edema normal ROM and strength.  Mental Status: Judgment, insight: Intact Orientation: Oriented to time, place, and person Mood and affect: No depression, anxiety, or agitation     DATA REVIEWED:  Results for GURBANI, FIGGE (MRN Ricky Ala) as of 12/19/2020 16:30  Ref. Range 12/19/2020 11:23  TSH Latest Ref Range: 0.35 - 4.50 uIU/mL 1.33     ASSESSMENT / PLAN / RECOMMENDATIONS:   Hypothyroidism:    - Pt is clinically euthyroid  - No local neck symptom - takes NP thyroid appropriately  - No changes today as TSH is normal    Medications   Continue NP thyroid 60 mg daily    F/U in 6 months    Signed electronically by: 12/21/2020, MD  [facility] Endocrinology  [facility] [address] [place] [place], [place] Phone: [phone] FAX: [phone]      CC: [phone], MD [address] [place] [facility] [place] Phone: [phone]  Fax: [phone]   Return to Endocrinology  clinic as below: Future Appointments  Date Time Provider Department Center  06/19/2021 10:00 AM 06/21/2021, MD [id number]

## 2020-12-19 ENCOUNTER — Encounter: Payer: Self-pay | Admitting: Internal Medicine

## 2020-12-19 ENCOUNTER — Ambulatory Visit (INDEPENDENT_AMBULATORY_CARE_PROVIDER_SITE_OTHER): Payer: Medicare HMO | Admitting: Internal Medicine

## 2020-12-19 ENCOUNTER — Other Ambulatory Visit: Payer: Self-pay

## 2020-12-19 VITALS — BP 118/72 | HR 70 | Ht 63.0 in | Wt 129.5 lb

## 2020-12-19 DIAGNOSIS — E039 Hypothyroidism, unspecified: Secondary | ICD-10-CM | POA: Diagnosis not present

## 2020-12-19 LAB — TSH: TSH: 1.33 u[IU]/mL (ref 0.35–4.50)

## 2020-12-19 MED ORDER — THYROID 60 MG PO TABS: 60.0000 mg | ORAL_TABLET | Freq: Every day | ORAL | 2 refills | Status: DC

## 2020-12-19 NOTE — Patient Instructions (Addendum)
-   Continue  NP thyroid  60 mg daily

## 2021-03-02 DIAGNOSIS — Z961 Presence of intraocular lens: Secondary | ICD-10-CM | POA: Diagnosis not present

## 2021-03-02 DIAGNOSIS — H401132 Primary open-angle glaucoma, bilateral, moderate stage: Secondary | ICD-10-CM | POA: Diagnosis not present

## 2021-03-02 DIAGNOSIS — H0102B Squamous blepharitis left eye, upper and lower eyelids: Secondary | ICD-10-CM | POA: Diagnosis not present

## 2021-03-02 DIAGNOSIS — H0102A Squamous blepharitis right eye, upper and lower eyelids: Secondary | ICD-10-CM | POA: Diagnosis not present

## 2021-03-05 DIAGNOSIS — H401132 Primary open-angle glaucoma, bilateral, moderate stage: Secondary | ICD-10-CM | POA: Diagnosis not present

## 2021-03-12 DIAGNOSIS — H401132 Primary open-angle glaucoma, bilateral, moderate stage: Secondary | ICD-10-CM | POA: Diagnosis not present

## 2021-05-11 DIAGNOSIS — H401132 Primary open-angle glaucoma, bilateral, moderate stage: Secondary | ICD-10-CM | POA: Diagnosis not present

## 2021-05-11 DIAGNOSIS — H04123 Dry eye syndrome of bilateral lacrimal glands: Secondary | ICD-10-CM | POA: Diagnosis not present

## 2021-05-11 DIAGNOSIS — K5090 Crohn's disease, unspecified, without complications: Secondary | ICD-10-CM | POA: Diagnosis not present

## 2021-06-19 ENCOUNTER — Other Ambulatory Visit: Payer: Self-pay

## 2021-06-19 ENCOUNTER — Ambulatory Visit (INDEPENDENT_AMBULATORY_CARE_PROVIDER_SITE_OTHER): Payer: Medicare HMO | Admitting: Internal Medicine

## 2021-06-19 ENCOUNTER — Encounter: Payer: Self-pay | Admitting: Internal Medicine

## 2021-06-19 VITALS — BP 116/76 | HR 75 | Temp 98.3°F | Resp 18 | Ht 63.0 in | Wt 129.2 lb

## 2021-06-19 DIAGNOSIS — M1909 Primary osteoarthritis, other specified site: Secondary | ICD-10-CM | POA: Diagnosis not present

## 2021-06-19 DIAGNOSIS — Z7185 Encounter for immunization safety counseling: Secondary | ICD-10-CM

## 2021-06-19 DIAGNOSIS — E039 Hypothyroidism, unspecified: Secondary | ICD-10-CM

## 2021-06-19 DIAGNOSIS — R739 Hyperglycemia, unspecified: Secondary | ICD-10-CM

## 2021-06-19 NOTE — Patient Instructions (Signed)
Proceed with a flu shot this fall  GO TO THE FRONT DESK, PLEASE SCHEDULE YOUR APPOINTMENTS Come back for   a checkup in 1 year

## 2021-06-19 NOTE — Progress Notes (Signed)
Subjective:    Patient ID: Ann Bell, female    DOB: 1946-10-26, 75 y.o.   MRN: [id number]  DOS:  06/19/2021 Type of visit - description: f/u Here for a routine checkup, last seen 07-2019. In general feels well, she remains active and has no major concerns except right hand pain at the base of the thumb  Review of Systems See above   Past Medical History:  Diagnosis Date   Anemia    Arthritis    Chronic back pain    spondylolisthesis   Glaucoma    Headache    History of blood transfusion    no abnormal reaction noted   History of colon polyps    benign   Hypothyroidism    takes NP Thyroid   Perforated ulcer (HCC)    Peripheral neuropathy    Pneumonia    hx of-yrs ago   Primary localized osteoarthritis of left knee 04/14/2018    Past Surgical History:  Procedure Laterality Date   ABDOMINAL HYSTERECTOMY  1973   still has one ovary   ANTERIOR LAT LUMBAR FUSION N/A 02/28/2017   Procedure: Lumbar four-five Transpsoas lateral interbody fusion with Lumbar four-five Percutaneous pedicle screw fixation, posterolateral arthrodesis with  Mazor ;  Surgeon: Ditty, Loura Halt, MD;  Location: [facility] OR;  Service: Neurosurgery;  Laterality: N/A;   APPENDECTOMY  1965   APPLICATION OF ROBOTIC ASSISTANCE FOR SPINAL PROCEDURE N/A 02/28/2017   Procedure: APPLICATION OF ROBOTIC ASSISTANCE FOR SPINAL PROCEDURE;  Surgeon: Ditty, Loura Halt, MD;  Location: [facility] OR;  Service: Neurosurgery;  Laterality: N/A;   cataract surgery Bilateral    CESAREAN SECTION     x2, 1968, 1972   COLON SURGERY  2018   hemi-colectomy resection   COLONOSCOPY WITH ESOPHAGOGASTRODUODENOSCOPY (EGD)     EPIDURAL BLOCK INJECTION     several times   ganglion cyst removed from throat     JOINT REPLACEMENT     LUMBAR PERCUTANEOUS PEDICLE SCREW 1 LEVEL N/A 02/28/2017   Procedure: LUMBAR PERCUTANEOUS PEDICLE SCREW LUMBAR FOUR-FIVE;  Surgeon: Ditty, Loura Halt, MD;  Location: [facility] OR;  Service: Neurosurgery;  Laterality:  N/A;   OVARIAN CYST REMOVAL  1965   PARTIAL KNEE ARTHROPLASTY Left 04/14/2018   Procedure: UNICOMPARTMENTAL KNEE;  Surgeon: Teryl Lucy, MD;  Location: [facility] OR;  Service: Orthopedics;  Laterality: Left;   REPLACEMENT UNICONDYLAR JOINT KNEE Left 04/14/2018   TONSILLECTOMY AND ADENOIDECTOMY  1950    Allergies as of 06/19/2021   No Known Allergies      Medication List        Accurate as of June 19, 2021  2:41 PM. If you have any questions, ask your nurse or doctor.          DHEA PO Take 1 capsule by mouth daily.   latanoprost 0.005 % ophthalmic solution Commonly known as: XALATAN Place 1 drop into both eyes at bedtime.   multivitamin with minerals Tabs tablet Take 1 tablet by mouth daily.   NALTREXONE HCL PO Take 4.5 mg by mouth daily.   progesterone 100 MG capsule Commonly known as: PROMETRIUM Take 100 mg by mouth daily.   Testosterone 20 % Crea 0.25 mg by Does not apply route daily.   thyroid 60 MG tablet Commonly known as: NP Thyroid Take 1 tablet (60 mg total) by mouth daily before breakfast.           Objective:   Physical Exam BP 116/76 (BP Location: Left Arm, Patient Position: Sitting, Cuff  Size: Small)   Pulse 75   Temp 98.3 F (36.8 C) (Oral)   Resp 18   Ht 5\' 3"  (1.6 m)   Wt 129 lb 4 oz (58.6 kg)   SpO2 98%   BMI 22.90 kg/m  General:   Well developed, NAD, BMI noted.  HEENT:  Normocephalic . Face symmetric, atraumatic Lungs:  CTA B Normal respiratory effort, no intercostal retractions, no accessory muscle use. Heart: RRR,  no murmur.  Abdomen:  Not distended, soft, non-tender. No rebound or rigidity.   MSK: Hands with bony changes consistent with DJD but no synovitis Lower extremities: no pretibial edema bilaterally  Neurologic:  alert & oriented X3.  Speech normal, gait appropriate for age and unassisted Psych--  Cognition and judgment appear intact.  Cooperative with normal attention span and concentration.  Behavior  appropriate. No anxious or depressed appearing.     Assessment     Assessment (new patient 04/2016, referred by 05/2016). Prediabetes Hypothyroidism DJD Menopause  GI:  - GERD, constipation - 06-2016: Pneumoperitoneum due to perforated duodenal ulcer.  Nonsurgical treatment. - 11-2016: Epigastric pain, had a EGD and colonoscopy, + gastritis, negative H. pylori, fundic gland polyps.  Mild duodenitis. Large tubular adenoma - Right colectomy 03/2017 Glaucoma Follows by 04/2017 NP, a holistic nurse practitioner  PLAN: Prediabetes: Follow elsewhere. Hypothyroidism: Per Endo DJD: Having pain at the base of the right thumb, she will call for a referral to Ortho when ready. Glaucoma: Follow-up by ophthalmology Preventive care: Tdap 2019 Shingrix x2 COVID VAX x4 PNM 13: 2018.  PNM 23: 2019. Had a colonoscopy 2018, recommend to contact GI (name?) to see about her next cscope She sees  2019, NP for her routine care including female care. States will have labs drawn soon will send me a copy RTC 1 year  Spent 18 minutes, reviewing notes from Endo, assessing DJD of the hand.  Also providing vaccine advise. Plan to review labs that will be sent to me soon.  Next This visit occurred during the SARS-CoV-2 public health emergency.  Safety protocols were in place, including screening questions prior to the visit, additional usage of staff PPE, and extensive cleaning of exam room while observing appropriate contact time as indicated for disinfecting solutions.

## 2021-06-19 NOTE — Assessment & Plan Note (Signed)
Prediabetes: Follow elsewhere. Hypothyroidism: Per Endo DJD: Having pain at the base of the right thumb, she will call for a referral to Ortho when ready. Glaucoma: Follow-up by ophthalmology Preventive care: Tdap 2019 Shingrix x2 COVID VAX x4 PNM 13: 2018.  PNM 23: 2019. Had a colonoscopy 2018, recommend to contact GI (name?) to see about her next cscope She sees  Lovell Sheehan, NP for her routine care including female care. States will have labs drawn soon will send me a copy RTC 1 year

## 2021-07-20 DIAGNOSIS — D529 Folate deficiency anemia, unspecified: Secondary | ICD-10-CM | POA: Diagnosis not present

## 2021-07-20 DIAGNOSIS — E569 Vitamin deficiency, unspecified: Secondary | ICD-10-CM | POA: Diagnosis not present

## 2021-07-20 DIAGNOSIS — M2550 Pain in unspecified joint: Secondary | ICD-10-CM | POA: Diagnosis not present

## 2021-07-20 DIAGNOSIS — E785 Hyperlipidemia, unspecified: Secondary | ICD-10-CM | POA: Diagnosis not present

## 2021-07-20 DIAGNOSIS — E039 Hypothyroidism, unspecified: Secondary | ICD-10-CM | POA: Diagnosis not present

## 2021-07-20 DIAGNOSIS — D510 Vitamin B12 deficiency anemia due to intrinsic factor deficiency: Secondary | ICD-10-CM | POA: Diagnosis not present

## 2021-07-20 DIAGNOSIS — R799 Abnormal finding of blood chemistry, unspecified: Secondary | ICD-10-CM | POA: Diagnosis not present

## 2021-07-20 DIAGNOSIS — E559 Vitamin D deficiency, unspecified: Secondary | ICD-10-CM | POA: Diagnosis not present

## 2021-07-20 DIAGNOSIS — Z780 Asymptomatic menopausal state: Secondary | ICD-10-CM | POA: Diagnosis not present

## 2021-07-20 DIAGNOSIS — R5382 Chronic fatigue, unspecified: Secondary | ICD-10-CM | POA: Diagnosis not present

## 2021-07-20 DIAGNOSIS — R7303 Prediabetes: Secondary | ICD-10-CM | POA: Diagnosis not present

## 2021-07-24 ENCOUNTER — Encounter: Payer: Self-pay | Admitting: Internal Medicine

## 2021-07-24 ENCOUNTER — Other Ambulatory Visit: Payer: Self-pay

## 2021-07-24 ENCOUNTER — Ambulatory Visit (INDEPENDENT_AMBULATORY_CARE_PROVIDER_SITE_OTHER): Payer: Medicare HMO | Admitting: Internal Medicine

## 2021-07-24 VITALS — BP 114/76 | HR 80 | Ht 63.0 in | Wt 130.0 lb

## 2021-07-24 DIAGNOSIS — E039 Hypothyroidism, unspecified: Secondary | ICD-10-CM | POA: Diagnosis not present

## 2021-07-24 DIAGNOSIS — Z23 Encounter for immunization: Secondary | ICD-10-CM

## 2021-07-24 NOTE — Progress Notes (Signed)
Name: Ann Bell  MRN/ DOB: [id number], 1946/06/29    Age/ Sex: 75 y.o., female     PCP: Wanda Plump, MD   Reason for Endocrinology Evaluation: Hypothyroidism     Initial Endocrinology Clinic Visit: 09/12/2020    PATIENT IDENTIFIER: [name] is a 75 y.o., female with a past medical history of hypothyroidism. She has followed with [facility] since 09/12/2020 for consultative assistance with management of her hypothyroidism.   HISTORICAL SUMMARY: T Pt follows with a [facility] doctor and is currently on NP thyroid, testosterone cream and progesterone as well as Naltrexone .   She has been diagnosed with hypothyroidism years ago. She denies prior to surgery or radiation exposure . With a TSH suppressed at < 0.01 uIU/Ml , we reduced NP thyroid dose        SUBJECTIVE:     Today (07/24/2021):  Ann Bell is here for a follow up on hypothyroidism   Weight has been stable  She continues to exercise  Denies diarrhea or constipation  Denies palpitations  Denies  tremors  Has noted hair loss briefly but this has been improving  She is on Biotin     She is on NP thyroid 60 mg daily      HISTORY:  Past Medical History:  Past Medical History:  Diagnosis Date   Anemia    Arthritis    Chronic back pain    spondylolisthesis   Glaucoma    Headache    History of blood transfusion    no abnormal reaction noted   History of colon polyps    benign   Hypothyroidism    takes NP Thyroid   Perforated ulcer (HCC)    Peripheral neuropathy    Pneumonia    hx of-yrs ago   Primary localized osteoarthritis of left knee 04/14/2018   Past Surgical History:  Past Surgical History:  Procedure Laterality Date   ABDOMINAL HYSTERECTOMY  1973   still has one ovary   ANTERIOR LAT LUMBAR FUSION N/A 02/28/2017   Procedure: Lumbar four-five Transpsoas lateral interbody fusion with Lumbar four-five Percutaneous pedicle screw fixation, posterolateral arthrodesis with   Mazor ;  Surgeon: Ditty, Loura Halt, MD;  Location: [facility] OR;  Service: Neurosurgery;  Laterality: N/A;   APPENDECTOMY  1965   APPLICATION OF ROBOTIC ASSISTANCE FOR SPINAL PROCEDURE N/A 02/28/2017   Procedure: APPLICATION OF ROBOTIC ASSISTANCE FOR SPINAL PROCEDURE;  Surgeon: Ditty, Loura Halt, MD;  Location: [facility] OR;  Service: Neurosurgery;  Laterality: N/A;   cataract surgery Bilateral    CESAREAN SECTION     x2, 1968, 1972   COLON SURGERY  2018   hemi-colectomy resection   COLONOSCOPY WITH ESOPHAGOGASTRODUODENOSCOPY (EGD)     EPIDURAL BLOCK INJECTION     several times   ganglion cyst removed from throat     JOINT REPLACEMENT     LUMBAR PERCUTANEOUS PEDICLE SCREW 1 LEVEL N/A 02/28/2017   Procedure: LUMBAR PERCUTANEOUS PEDICLE SCREW LUMBAR FOUR-FIVE;  Surgeon: Ditty, Loura Halt, MD;  Location: [facility] OR;  Service: Neurosurgery;  Laterality: N/A;   OVARIAN CYST REMOVAL  1965   PARTIAL KNEE ARTHROPLASTY Left 04/14/2018   Procedure: UNICOMPARTMENTAL KNEE;  Surgeon: Teryl Lucy, MD;  Location: [facility] OR;  Service: Orthopedics;  Laterality: Left;   REPLACEMENT UNICONDYLAR JOINT KNEE Left 04/14/2018   TONSILLECTOMY AND ADENOIDECTOMY  1950   Social History:  reports that she quit smoking about 43 years ago. Her smoking use included cigarettes. She has a  10.00 pack-year smoking history. She has never used smokeless tobacco. She reports current alcohol use. She reports that she does not use drugs. Family History:  Family History  Problem Relation Age of Onset   Diabetes Mother    Breast cancer Maternal Grandmother    Liver disease Paternal Grandfather    Hearing loss Father    Colon cancer Neg Hx    CAD Neg Hx      HOME MEDICATIONS: Allergies as of 07/24/2021   No Known Allergies      Medication List        Accurate as of July 24, 2021  1:43 PM. If you have any questions, ask your nurse or doctor.          STOP taking these medications    thyroid 60 MG tablet Commonly  known as: NP Thyroid Stopped by: Ann Shorts, MD       TAKE these medications    DHEA PO Take 1 capsule by mouth daily.   latanoprost 0.005 % ophthalmic solution Commonly known as: XALATAN Place 1 drop into both eyes at bedtime.   multivitamin with minerals Tabs tablet Take 1 tablet by mouth daily.   NALTREXONE HCL PO Take 4.5 mg by mouth daily.   progesterone 100 MG capsule Commonly known as: PROMETRIUM Take 100 mg by mouth daily.   Testosterone 20 % Crea 0.25 mg by Does not apply route daily.          OBJECTIVE:   PHYSICAL EXAM: VS: BP 114/76 (BP Location: Left Arm, Patient Position: Sitting, Cuff Size: Small)   Pulse 80   Ht 5\' 3"  (1.6 m)   Wt 130 lb (59 kg)   SpO2 96%   BMI 23.03 kg/m    EXAM: General: Pt appears well and is in NAD  Neck: General: Supple without adenopathy. Thyroid: Thyroid size normal.  No goiter or nodules appreciated.  Lungs: Clear with good BS bilat with no rales, rhonchi, or wheezes  Heart: Auscultation: RRR.  Abdomen: Normoactive bowel sounds, soft, nontender, without masses or organomegaly palpable  Extremities:  BL LE: No pretibial edema normal ROM and strength.  Mental Status: Judgment, insight: Intact Orientation: Oriented to time, place, and person Mood and affect: No depression, anxiety, or agitation     DATA REVIEWED:  Results for MAKEBA, DELCASTILLO (MRN Ricky Ala) as of 12/19/2020 16:30  Ref. Range 12/19/2020 11:23  TSH Latest Ref Range: 0.35 - 4.50 uIU/mL 1.33     ASSESSMENT / PLAN / RECOMMENDATIONS:   Hypothyroidism:    - Pt is clinically euthyroid  - No local neck symptom - takes NP thyroid appropriately  - Pt is on Biotin and unable to repeat TSH today, she will return in a few days after she hold Biotin for at least 2-3 days    Medications   Continue NP thyroid 60 mg daily    F/U in 1 yr     Signed electronically by: 12/21/2020, MD  [facility] Endocrinology  [facility] [address] [place] [place], [place] Phone: [phone] FAX: [phone]      CC: [phone], MD [address] [facility] [place] Phone: [phone]  Fax: [phone]   Return to Endocrinology clinic as below: Future Appointments  Date Time Provider Department Center  06/25/2022  1:00 PM 06/27/2022, MD [id number]

## 2021-07-24 NOTE — Patient Instructions (Addendum)
-   Continue  NP thyroid  60 mg daily   - Please HOLD Biotin 2-3 days prior to any future thyroid blood work

## 2021-07-30 ENCOUNTER — Other Ambulatory Visit (INDEPENDENT_AMBULATORY_CARE_PROVIDER_SITE_OTHER): Payer: Medicare HMO

## 2021-07-30 ENCOUNTER — Other Ambulatory Visit: Payer: Self-pay

## 2021-07-30 DIAGNOSIS — E039 Hypothyroidism, unspecified: Secondary | ICD-10-CM | POA: Diagnosis not present

## 2021-07-30 LAB — TSH: TSH: 1.65 u[IU]/mL (ref 0.35–5.50)

## 2021-07-31 ENCOUNTER — Other Ambulatory Visit: Payer: Self-pay | Admitting: Internal Medicine

## 2021-07-31 MED ORDER — THYROID 60 MG PO TABS: 60.0000 mg | ORAL_TABLET | Freq: Every day | ORAL | 3 refills | Status: DC

## 2021-09-06 ENCOUNTER — Telehealth: Payer: Self-pay | Admitting: Internal Medicine

## 2021-09-06 NOTE — Telephone Encounter (Signed)
Left message for patient to call back and schedule Medicare Annual Wellness Visit (AWV) in office.  ° °If not able to come in office, please offer to do virtually or by telephone.  Left office number and my jabber #[phone]. ° °Due for AWVI ° °Please schedule at anytime with Nurse Health Advisor. °  °

## 2021-09-14 DIAGNOSIS — H04123 Dry eye syndrome of bilateral lacrimal glands: Secondary | ICD-10-CM | POA: Diagnosis not present

## 2021-09-14 DIAGNOSIS — H401132 Primary open-angle glaucoma, bilateral, moderate stage: Secondary | ICD-10-CM | POA: Diagnosis not present

## 2021-09-14 DIAGNOSIS — H26493 Other secondary cataract, bilateral: Secondary | ICD-10-CM | POA: Diagnosis not present

## 2021-09-14 DIAGNOSIS — Z961 Presence of intraocular lens: Secondary | ICD-10-CM | POA: Diagnosis not present

## 2021-09-18 ENCOUNTER — Other Ambulatory Visit: Payer: Self-pay

## 2021-09-18 ENCOUNTER — Ambulatory Visit (INDEPENDENT_AMBULATORY_CARE_PROVIDER_SITE_OTHER): Payer: Medicare HMO

## 2021-09-18 ENCOUNTER — Telehealth (HOSPITAL_BASED_OUTPATIENT_CLINIC_OR_DEPARTMENT_OTHER): Payer: Self-pay

## 2021-09-18 VITALS — BP 128/82 | HR 79 | Temp 98.8°F | Resp 16 | Ht 63.0 in | Wt 132.0 lb

## 2021-09-18 DIAGNOSIS — Z0000 Encounter for general adult medical examination without abnormal findings: Secondary | ICD-10-CM | POA: Diagnosis not present

## 2021-09-18 DIAGNOSIS — Z780 Asymptomatic menopausal state: Secondary | ICD-10-CM | POA: Diagnosis not present

## 2021-09-18 NOTE — Progress Notes (Addendum)
Subjective:   Ann Bell is a 75 y.o. female who presents for Medicare Annual (Subsequent) preventive examination.  Review of Systems     Cardiac Risk Factors include: advanced age (>57men, >39 women)     Objective:    Today's Vitals   09/18/21 1020 09/18/21 1025  BP: 128/82   Pulse: 79   Resp: 16   Temp: 98.8 F (37.1 C)   TempSrc: Oral   SpO2: 98%   Weight: 132 lb (59.9 kg)   Height: 5\' 3"  (1.6 m)   PainSc:  0-No pain   Body mass index is 23.38 kg/m.  Advanced Directives 09/18/2021 04/03/2018 05/16/2017 03/11/2017 03/10/2017 03/01/2017 02/25/2017  Does Patient Have a Medical Advance Directive? Yes Yes Yes - Yes Yes Yes  Type of Advance Directive Healthcare Power of [place];Living will Healthcare Power of [place];Living will - - Living will Healthcare Power of [place];Living will Healthcare Power of [place];Living will  Does patient want to make changes to medical advance directive? - No - Patient declined - No - Patient declined - No - Patient declined -  Copy of Healthcare Power of Attorney in Chart? No - copy requested No - copy requested - - Yes No - copy requested Yes    Current Medications (verified) Outpatient Encounter Medications as of 09/18/2021  Medication Sig   latanoprost (XALATAN) 0.005 % ophthalmic solution Place 1 drop into both eyes at bedtime.   Multiple Vitamin (MULTIVITAMIN WITH MINERALS) TABS tablet Take 1 tablet by mouth daily.   NALTREXONE HCL PO Take 4.5 mg by mouth daily.   Nutritional Supplements (DHEA PO) Take 1 capsule by mouth daily.   progesterone (PROMETRIUM) 100 MG capsule Take 100 mg by mouth daily.   Testosterone 20 % CREA 0.25 mg by Does not apply route daily.   thyroid (NP THYROID) 60 MG tablet Take 1 tablet (60 mg total) by mouth daily before breakfast.   No facility-administered encounter medications on file as of 09/18/2021.    Allergies (verified) Patient has no known allergies.   History: Past Medical History:  Diagnosis  Date   Anemia    Arthritis    Chronic back pain    spondylolisthesis   Glaucoma    Headache    History of blood transfusion    no abnormal reaction noted   History of colon polyps    benign   Hypothyroidism    takes NP Thyroid   Perforated ulcer (HCC)    Peripheral neuropathy    Pneumonia    hx of-yrs ago   Primary localized osteoarthritis of left knee 04/14/2018   Past Surgical History:  Procedure Laterality Date   ABDOMINAL HYSTERECTOMY  1973   still has one ovary   ANTERIOR LAT LUMBAR FUSION N/A 02/28/2017   Procedure: Lumbar four-five Transpsoas lateral interbody fusion with Lumbar four-five Percutaneous pedicle screw fixation, posterolateral arthrodesis with  Mazor ;  Surgeon: Ditty, Loura Halt, MD;  Location: [facility] OR;  Service: Neurosurgery;  Laterality: N/A;   APPENDECTOMY  1965   APPLICATION OF ROBOTIC ASSISTANCE FOR SPINAL PROCEDURE N/A 02/28/2017   Procedure: APPLICATION OF ROBOTIC ASSISTANCE FOR SPINAL PROCEDURE;  Surgeon: Ditty, Loura Halt, MD;  Location: [facility] OR;  Service: Neurosurgery;  Laterality: N/A;   cataract surgery Bilateral    CESAREAN SECTION     x2, 1968, 1972   COLON SURGERY  2018   hemi-colectomy resection   COLONOSCOPY WITH ESOPHAGOGASTRODUODENOSCOPY (EGD)     EPIDURAL BLOCK INJECTION     several times  ganglion cyst removed from throat     JOINT REPLACEMENT     LUMBAR PERCUTANEOUS PEDICLE SCREW 1 LEVEL N/A 02/28/2017   Procedure: LUMBAR PERCUTANEOUS PEDICLE SCREW LUMBAR FOUR-FIVE;  Surgeon: Ditty, Loura Halt, MD;  Location: [facility] OR;  Service: Neurosurgery;  Laterality: N/A;   OVARIAN CYST REMOVAL  1965   PARTIAL KNEE ARTHROPLASTY Left 04/14/2018   Procedure: UNICOMPARTMENTAL KNEE;  Surgeon: Teryl Lucy, MD;  Location: [facility] OR;  Service: Orthopedics;  Laterality: Left;   REPLACEMENT UNICONDYLAR JOINT KNEE Left 04/14/2018   TONSILLECTOMY AND ADENOIDECTOMY  1950   Family History  Problem Relation Age of Onset   Diabetes Mother    Breast cancer  Maternal Grandmother    Liver disease Paternal Grandfather    Hearing loss Father    Colon cancer Neg Hx    CAD Neg Hx    Social History   Socioeconomic History   Marital status: Married    Spouse name: Not on file   Number of children: 2   Years of education: Not on file   Highest education level: Not on file  Occupational History   Occupation: n/a  Tobacco Use   Smoking status: Former    Packs/day: 0.50    Years: 20.00    Pack years: 10.00    Types: Cigarettes    Quit date: 06/07/1978    Years since quitting: 43.3   Smokeless tobacco: Never  Vaping Use   Vaping Use: Never used  Substance and Sexual Activity   Alcohol use: Yes    Comment: socially    Drug use: No   Sexual activity: Not Currently  Other Topics Concern   Not on file  Social History Narrative   Lives w/ husband   Social Determinants of Health   Financial Resource Strain: Low Risk    Difficulty of Paying Living Expenses: Not hard at all  Food Insecurity: No Food Insecurity   Worried About Programme researcher, broadcasting/film/video in the Last Year: Never true   Ran Out of Food in the Last Year: Never true  Transportation Needs: No Transportation Needs   Lack of Transportation (Medical): No   Lack of Transportation (Non-Medical): No  Physical Activity: Sufficiently Active   Days of Exercise per Week: 7 days   Minutes of Exercise per Session: 60 min  Stress: No Stress Concern Present   Feeling of Stress : Only a little  Social Connections: Press photographer of Communication with Friends and Family: More than three times a week   Frequency of Social Gatherings with Friends and Family: More than three times a week   Attends Religious Services: More than 4 times per year   Active Member of [employer] or Organizations: Yes   Attends Engineer, structural: More than 4 times per year   Marital Status: Married    Tobacco Counseling Counseling given: Not Answered   Clinical Intake:  Pre-visit  preparation completed: Yes  Pain : 0-10 Pain Score: 0-No pain (comes & goes) Pain Type: Chronic pain Pain Location: Wrist Pain Orientation: Right Pain Onset: More than a month ago Pain Frequency: Intermittent     BMI - recorded: 23.38 Nutritional Status: BMI of 19-24  Normal Nutritional Risks: None Diabetes: No  How often do you need to have someone help you when you read instructions, pamphlets, or other written materials from your doctor or pharmacy?: 1 - Never  Diabetic?No  Interpreter Needed?: No  Information entered by :: Thomasenia Sales LPN  Activities of Daily Living In your present state of health, do you have any difficulty performing the following activities: 09/18/2021 06/19/2021  Hearing? N N  Vision? N N  Difficulty concentrating or making decisions? N N  Walking or climbing stairs? N N  Dressing or bathing? N N  Doing errands, shopping? N N  Preparing Food and eating ? N -  Using the Toilet? N -  In the past six months, have you accidently leaked urine? N -  Do you have problems with loss of bowel control? N -  Managing your Medications? N -  Managing your Finances? N -  Housekeeping or managing your Housekeeping? N -  Some recent data might be hidden    Patient Care Team: Wanda Plump, MD as PCP - General (Internal Medicine) Teryl Lucy, MD as Consulting Physician (Orthopedic Surgery) Campbell Lerner, MD as Referring Physician (Surgery) Nelson Chimes, MD as Consulting Physician (Ophthalmology) Bridgette Habermann, NP as Nurse Practitioner  Indicate any recent Medical Services you may have received from other than [facility] providers in the past year (date may be approximate).     Assessment:   This is a routine wellness examination for Makyna.  Hearing/Vision screen Hearing Screening - Comments:: No issues Vision Screening - Comments:: Last eye exam-08/2021-Dr. Sherrine Maples  Dietary issues and exercise activities discussed: Current Exercise Habits:  Structured exercise class, Type of exercise: yoga;stretching;strength training/weights, Time (Minutes): 60, Frequency (Times/Week): 7, Weekly Exercise (Minutes/Week): 420, Intensity: Moderate, Exercise limited by: None identified   Goals Addressed             This Visit's Progress    Patient Stated       Continue sessions with personal trainer       Depression Screen PHQ 2/9 Scores 09/18/2021 06/19/2021 08/16/2019 05/13/2018  PHQ - 2 Score 0 0 0 0    Fall Risk Fall Risk  09/18/2021 06/19/2021 08/16/2019 05/13/2018  Falls in the past year? 0 0 0 No  Number falls in past yr: 0 0 - -  Injury with Fall? 0 0 - -  Follow up Falls prevention discussed Falls evaluation completed Falls evaluation completed -    FALL RISK PREVENTION PERTAINING TO THE HOME:  Any stairs in or around the home? Yes  If so, are there any without handrails? No  Home free of loose throw rugs in walkways, pet beds, electrical cords, etc? No  Adequate lighting in your home to reduce risk of falls? Yes   ASSISTIVE DEVICES UTILIZED TO PREVENT FALLS:  Life alert? No  Use of a cane, walker or w/c? No  Grab bars in the bathroom? Yes  Shower chair or bench in shower? No  Elevated toilet seat or a handicapped toilet? Yes   TIMED UP AND GO:  Was the test performed? Yes .  Length of time to ambulate 10 feet: 10 sec.   Gait steady and fast without use of assistive device  Cognitive Function:Normal cognitive status assessed by direct observation by this Nurse Health Advisor. No abnormalities found.          Immunizations Immunization History  Administered Date(s) Administered   Fluad Quad(high Dose 65+) 07/24/2021   H1N1 10/13/2008   Influenza, High Dose Seasonal PF 09/02/2017   Influenza, Seasonal, Injecte, Preservative Fre 06/26/2019   Influenza,inj,Quad PF,6+ Mos 10/30/2013   Influenza-Unspecified 08/07/2018   PFIZER(Purple Top)SARS-COV-2 Vaccination 12/04/2019, 12/28/2019, 08/21/2020, 03/14/2021    Pfizer Covid-19 Vaccine Bivalent Booster 89yrs & up 08/07/2021   Pneumococcal Conjugate-13 10/30/2016  Pneumococcal Polysaccharide-23 08/13/2018   Td 08/13/2018   Zoster Recombinat (Shingrix) 06/26/2019, 08/27/2019   Zoster, Live 04/29/2013    TDAP status: Up to date  Flu Vaccine status: Up to date  Pneumococcal vaccine status: Up to date  Covid-19 vaccine status: Completed vaccines  Qualifies for Shingles Vaccine? No   Zostavax completed Yes   Shingrix Completed?: Yes  Screening Tests Health Maintenance  Topic Date Due   TETANUS/TDAP  08/13/2028   Pneumonia Vaccine 68+ Years old  Completed   INFLUENZA VACCINE  Completed   DEXA SCAN  Completed   COVID-19 Vaccine  Completed   Hepatitis C Screening  Completed   Zoster Vaccines- Shingrix  Completed   HPV VACCINES  Aged Out   COLONOSCOPY (Pts 45-50yrs Insurance coverage will need to be confirmed)  Discontinued    Health Maintenance  There are no preventive care reminders to display for this patient.   Colorectal cancer screening: No longer required.   Mammogram status: Declined  Bone Density status: Ordered today. Pt provided with contact info and advised to call to schedule appt.  Lung Cancer Screening: (Low Dose CT Chest recommended if Age 13-80 years, 30 pack-year currently smoking OR have quit w/in 15years.) does not qualify.     Additional Screening:  Hepatitis C Screening:  Completed 08/13/2018  Vision Screening: Recommended annual ophthalmology exams for early detection of glaucoma and other disorders of the eye. Is the patient up to date with their annual eye exam?  Yes  Who is the provider or what is the name of the office in which the patient attends annual eye exams? Dr. Sherrine Maples    Dental Screening: Recommended annual dental exams for proper oral hygiene  Community Resource Referral / Chronic Care Management: CRR required this visit?  No   CCM required this visit?  No      Plan:     I have  personally reviewed and noted the following in the patient's chart:   Medical and social history Use of alcohol, tobacco or illicit drugs  Current medications and supplements including opioid prescriptions.  Functional ability and status Nutritional status Physical activity Advanced directives List of other physicians Hospitalizations, surgeries, and ER visits in previous 12 months Vitals Screenings to include cognitive, depression, and falls Referrals and appointments  In addition, I have reviewed and discussed with patient certain preventive protocols, quality metrics, and best practice recommendations. A written personalized care plan for preventive services as well as general preventive health recommendations were provided to patient.   Due to this being a telephonic visit, the after visit summary with patients personalized plan was offered to patient via mail or my-chart. Patient would like to access on my-chart.   Roanna Raider, LPN   [id number]/07/[id number]  Nurse Health Advisor  Nurse Notes: None  I have reviewed and agree with Health Coaches documentation.  Willow Ora, MD

## 2021-09-18 NOTE — Patient Instructions (Signed)
Ann Bell , Thank you for taking time to come for your Medicare Wellness Visit. I appreciate your ongoing commitment to your health goals. Please review the following plan we discussed and let me know if I can assist you in the future.   Screening recommendations/referrals: Colonoscopy: No longer required Mammogram: Declined Bone Density: Ordered today. Someone will call you to schedule. Recommended yearly ophthalmology/optometry visit for glaucoma screening and checkup Recommended yearly dental visit for hygiene and checkup  Vaccinations: Influenza vaccine: Up to date Pneumococcal vaccine: Up to date Tdap vaccine: Up to date Shingles vaccine: Completed vaccines   Covid-19:Up to date  Advanced directives: Please bring a copy of Living Will and/or Healthcare Power of Attorney for your chart.   Conditions/risks identified: See problem list  Next appointment: Follow up in one year for your annual wellness visit 09/23/2022 @1 :00   Preventive Care 65 Years and Older, Female Preventive care refers to lifestyle choices and visits with your health care provider that can promote health and wellness. What does preventive care include? A yearly physical exam. This is also called an annual well check. Dental exams once or twice a year. Routine eye exams. Ask your health care provider how often you should have your eyes checked. Personal lifestyle choices, including: Daily care of your teeth and gums. Regular physical activity. Eating a healthy diet. Avoiding tobacco and drug use. Limiting alcohol use. Practicing safe sex. Taking low-dose aspirin every day. Taking vitamin and mineral supplements as recommended by your health care provider. What happens during an annual well check? The services and screenings done by your health care provider during your annual well check will depend on your age, overall health, lifestyle risk factors, and family history of disease. Counseling  Your  health care provider may ask you questions about your: Alcohol use. Tobacco use. Drug use. Emotional well-being. Home and relationship well-being. Sexual activity. Eating habits. History of falls. Memory and ability to understand (cognition). Work and work . Reproductive health. Screening  You may have the following tests or measurements: Height, weight, and BMI. Blood pressure. Lipid and cholesterol levels. These may be checked every 5 years, or more frequently if you are over 81 years old. Skin check. Lung cancer screening. You may have this screening every year starting at age 30 if you have a 30-pack-year history of smoking and currently smoke or have quit within the past 15 years. Fecal occult blood test (FOBT) of the stool. You may have this test every year starting at age 38. Flexible sigmoidoscopy or colonoscopy. You may have a sigmoidoscopy every 5 years or a colonoscopy every 10 years starting at age 85. Hepatitis C blood test. Hepatitis B blood test. Sexually transmitted disease (STD) testing. Diabetes screening. This is done by checking your blood sugar (glucose) after you have not eaten for a while (fasting). You may have this done every 1-3 years. Bone density scan. This is done to screen for osteoporosis. You may have this done starting at age 45. Mammogram. This may be done every 1-2 years. Talk to your health care provider about how often you should have regular mammograms. Talk with your health care provider about your test results, treatment options, and if necessary, the need for more tests. Vaccines  Your health care provider may recommend certain vaccines, such as: Influenza vaccine. This is recommended every year. Tetanus, diphtheria, and acellular pertussis (Tdap, Td) vaccine. You may need a Td booster every 10 years. Zoster vaccine. You may need this after age  60. Pneumococcal 13-valent conjugate (PCV13) vaccine. One dose is recommended after age  70. Pneumococcal polysaccharide (PPSV23) vaccine. One dose is recommended after age 47. Talk to your health care provider about which screenings and vaccines you need and how often you need them. This information is not intended to replace advice given to you by your health care provider. Make sure you discuss any questions you have with your health care provider. Document Released: 11/10/2015 Document Revised: 07/03/2016 Document Reviewed: 08/15/2015 Elsevier Interactive Patient Education  2017 [place] Prevention in the Home Falls can cause injuries. They can happen to people of all ages. There are many things you can do to make your home safe and to help prevent falls. What can I do on the outside of my home? Regularly fix the edges of walkways and driveways and fix any cracks. Remove anything that might make you trip as you walk through a door, such as a raised step or threshold. Trim any bushes or trees on the path to your home. Use bright outdoor lighting. Clear any walking paths of anything that might make someone trip, such as rocks or tools. Regularly check to see if handrails are loose or broken. Make sure that both sides of any steps have handrails. Any raised decks and porches should have guardrails on the edges. Have any leaves, snow, or ice cleared regularly. Use sand or salt on walking paths during winter. Clean up any spills in your garage right away. This includes oil or grease spills. What can I do in the bathroom? Use night lights. Install grab bars by the toilet and in the tub and shower. Do not use towel bars as grab bars. Use non-skid mats or decals in the tub or shower. If you need to sit down in the shower, use a plastic, non-slip stool. Keep the floor dry. Clean up any water that spills on the floor as soon as it happens. Remove soap buildup in the tub or shower regularly. Attach bath mats securely with double-sided non-slip rug tape. Do not have throw  rugs and other things on the floor that can make you trip. What can I do in the bedroom? Use night lights. Make sure that you have a light by your bed that is easy to reach. Do not use any sheets or blankets that are too big for your bed. They should not hang down onto the floor. Have a firm chair that has side arms. You can use this for support while you get dressed. Do not have throw rugs and other things on the floor that can make you trip. What can I do in the kitchen? Clean up any spills right away. Avoid walking on wet floors. Keep items that you use a lot in easy-to-reach places. If you need to reach something above you, use a strong step stool that has a grab bar. Keep electrical cords out of the way. Do not use floor polish or wax that makes floors slippery. If you must use wax, use non-skid floor wax. Do not have throw rugs and other things on the floor that can make you trip. What can I do with my stairs? Do not leave any items on the stairs. Make sure that there are handrails on both sides of the stairs and use them. Fix handrails that are broken or loose. Make sure that handrails are as long as the stairways. Check any carpeting to make sure that it is firmly attached to the stairs. Fix  any carpet that is loose or worn. Avoid having throw rugs at the top or bottom of the stairs. If you do have throw rugs, attach them to the floor with carpet tape. Make sure that you have a light switch at the top of the stairs and the bottom of the stairs. If you do not have them, ask someone to add them for you. What else can I do to help prevent falls? Wear shoes that: Do not have high heels. Have rubber bottoms. Are comfortable and fit you well. Are closed at the toe. Do not wear sandals. If you use a stepladder: Make sure that it is fully opened. Do not climb a closed stepladder. Make sure that both sides of the stepladder are locked into place. Ask someone to hold it for you, if  possible. Clearly mark and make sure that you can see: Any grab bars or handrails. First and last steps. Where the edge of each step is. Use tools that help you move around (mobility aids) if they are needed. These include: Canes. Walkers. Scooters. Crutches. Turn on the lights when you go into a dark area. Replace any light bulbs as soon as they burn out. Set up your furniture so you have a clear path. Avoid moving your furniture around. If any of your floors are uneven, fix them. If there are any pets around you, be aware of where they are. Review your medicines with your doctor. Some medicines can make you feel dizzy. This can increase your chance of falling. Ask your doctor what other things that you can do to help prevent falls. This information is not intended to replace advice given to you by your health care provider. Make sure you discuss any questions you have with your health care provider. Document Released: 08/10/2009 Document Revised: 03/21/2016 Document Reviewed: 11/18/2014 Elsevier Interactive Patient Education  2017 [employer].

## 2021-09-28 ENCOUNTER — Other Ambulatory Visit (HOSPITAL_BASED_OUTPATIENT_CLINIC_OR_DEPARTMENT_OTHER): Payer: Self-pay | Admitting: Internal Medicine

## 2021-09-28 DIAGNOSIS — Z780 Asymptomatic menopausal state: Secondary | ICD-10-CM

## 2021-10-01 ENCOUNTER — Ambulatory Visit (HOSPITAL_BASED_OUTPATIENT_CLINIC_OR_DEPARTMENT_OTHER)
Admission: RE | Admit: 2021-10-01 | Discharge: 2021-10-01 | Disposition: A | Discharge status: Home / Self Care | Payer: Medicare HMO | Source: Ambulatory Visit | Attending: Internal Medicine | Admitting: Internal Medicine

## 2021-10-01 ENCOUNTER — Other Ambulatory Visit: Payer: Self-pay

## 2021-10-01 DIAGNOSIS — Z780 Asymptomatic menopausal state: Secondary | ICD-10-CM | POA: Insufficient documentation

## 2021-10-01 DIAGNOSIS — M85851 Other specified disorders of bone density and structure, right thigh: Secondary | ICD-10-CM | POA: Diagnosis not present

## 2022-01-31 ENCOUNTER — Other Ambulatory Visit (HOSPITAL_BASED_OUTPATIENT_CLINIC_OR_DEPARTMENT_OTHER): Payer: Self-pay | Admitting: Family Medicine

## 2022-01-31 ENCOUNTER — Ambulatory Visit (HOSPITAL_BASED_OUTPATIENT_CLINIC_OR_DEPARTMENT_OTHER)
Admission: RE | Admit: 2022-01-31 | Discharge: 2022-01-31 | Disposition: A | Discharge status: Home / Self Care | Payer: Medicare HMO | Source: Ambulatory Visit | Attending: Family Medicine | Admitting: Family Medicine

## 2022-01-31 DIAGNOSIS — M7989 Other specified soft tissue disorders: Secondary | ICD-10-CM | POA: Diagnosis not present

## 2022-01-31 DIAGNOSIS — M79641 Pain in right hand: Secondary | ICD-10-CM

## 2022-01-31 DIAGNOSIS — M25441 Effusion, right hand: Secondary | ICD-10-CM | POA: Diagnosis not present

## 2022-01-31 DIAGNOSIS — Z6821 Body mass index (BMI) 21.0-21.9, adult: Secondary | ICD-10-CM | POA: Diagnosis not present

## 2022-02-07 DIAGNOSIS — H401132 Primary open-angle glaucoma, bilateral, moderate stage: Secondary | ICD-10-CM | POA: Diagnosis not present

## 2022-02-07 DIAGNOSIS — H0102A Squamous blepharitis right eye, upper and lower eyelids: Secondary | ICD-10-CM | POA: Diagnosis not present

## 2022-02-07 DIAGNOSIS — H04123 Dry eye syndrome of bilateral lacrimal glands: Secondary | ICD-10-CM | POA: Diagnosis not present

## 2022-02-07 DIAGNOSIS — H26493 Other secondary cataract, bilateral: Secondary | ICD-10-CM | POA: Diagnosis not present

## 2022-03-07 DIAGNOSIS — Z1589 Genetic susceptibility to other disease: Secondary | ICD-10-CM | POA: Diagnosis not present

## 2022-03-07 DIAGNOSIS — M2550 Pain in unspecified joint: Secondary | ICD-10-CM | POA: Diagnosis not present

## 2022-03-07 DIAGNOSIS — R5382 Chronic fatigue, unspecified: Secondary | ICD-10-CM | POA: Diagnosis not present

## 2022-03-07 DIAGNOSIS — R799 Abnormal finding of blood chemistry, unspecified: Secondary | ICD-10-CM | POA: Diagnosis not present

## 2022-03-07 DIAGNOSIS — E039 Hypothyroidism, unspecified: Secondary | ICD-10-CM | POA: Diagnosis not present

## 2022-03-07 DIAGNOSIS — L649 Androgenic alopecia, unspecified: Secondary | ICD-10-CM | POA: Diagnosis not present

## 2022-03-07 DIAGNOSIS — E785 Hyperlipidemia, unspecified: Secondary | ICD-10-CM | POA: Diagnosis not present

## 2022-03-07 DIAGNOSIS — Z780 Asymptomatic menopausal state: Secondary | ICD-10-CM | POA: Diagnosis not present

## 2022-03-07 DIAGNOSIS — R7303 Prediabetes: Secondary | ICD-10-CM | POA: Diagnosis not present

## 2022-04-12 ENCOUNTER — Emergency Department (HOSPITAL_BASED_OUTPATIENT_CLINIC_OR_DEPARTMENT_OTHER)
Admission: EM | Admit: 2022-04-12 | Discharge: 2022-04-12 | Disposition: A | Discharge status: Home / Self Care | Payer: Medicare HMO | Attending: Emergency Medicine | Admitting: Emergency Medicine

## 2022-04-12 ENCOUNTER — Other Ambulatory Visit: Payer: Self-pay

## 2022-04-12 ENCOUNTER — Encounter (HOSPITAL_BASED_OUTPATIENT_CLINIC_OR_DEPARTMENT_OTHER): Payer: Self-pay

## 2022-04-12 DIAGNOSIS — R21 Rash and other nonspecific skin eruption: Secondary | ICD-10-CM

## 2022-04-12 DIAGNOSIS — L739 Follicular disorder, unspecified: Secondary | ICD-10-CM | POA: Diagnosis not present

## 2022-04-12 MED ORDER — CEPHALEXIN 250 MG PO CAPS: 250.0000 mg | ORAL_CAPSULE | Freq: Three times a day (TID) | ORAL | 0 refills | Status: DC

## 2022-04-12 MED ORDER — HYDROXYZINE HCL 10 MG PO TABS: 10.0000 mg | ORAL_TABLET | Freq: Four times a day (QID) | ORAL | 0 refills | Status: DC | PRN

## 2022-04-12 NOTE — ED Triage Notes (Signed)
States she has had a generalized rash since Monday. States has been dealing with inflammation/itching recently. Recently prescribed a cream that helped with the itching.

## 2022-04-12 NOTE — ED Provider Notes (Signed)
[facility] EMERGENCY DEPARTMENT Provider Note   CSN: [id number] Arrival date & time: 04/12/22  3818     History  Chief Complaint  Patient presents with   Rash    Ann Bell is a 76 y.o. female.  76 year old female presents with complaint of diffuse body itching and rash.  Rash started on Monday.  He states that she has had head itching x 1.5 years, applying a cream (Clotrimazole with Betamethasone which she applies to areas that itch with heat exposure (under arms, under breasts, etc).  But this is not helping.  She does work outside in her garden regularly.  No known exposure to anyone else with a rash.  Denies changes in lotions, soaps, detergents.  No new medications.  No other complaints or concerns.        Home Medications Prior to Admission medications   Medication Sig Start Date End Date Taking? Authorizing Provider  cephALEXin (KEFLEX) 250 MG capsule Take 1 capsule (250 mg total) by mouth 3 (three) times daily for 10 days. 04/12/22 04/22/22 Yes Jeannie Fend, PA-C  hydrOXYzine (ATARAX) 10 MG tablet Take 1 tablet (10 mg total) by mouth every 6 (six) hours as needed for itching. 04/12/22  Yes Jeannie Fend, PA-C  latanoprost (XALATAN) 0.005 % ophthalmic solution Place 1 drop into both eyes at bedtime. 07/20/18   [provider]  Multiple Vitamin (MULTIVITAMIN WITH MINERALS) TABS tablet Take 1 tablet by mouth daily.    [provider]  NALTREXONE HCL PO Take 4.5 mg by mouth daily.    [provider]  Nutritional Supplements (DHEA PO) Take 1 capsule by mouth daily.    [provider]  progesterone (PROMETRIUM) 100 MG capsule Take 100 mg by mouth daily.    [provider]  Testosterone 20 % CREA 0.25 mg by Does not apply route daily.    [provider]  thyroid (NP THYROID) 60 MG tablet Take 1 tablet (60 mg total) by mouth daily before breakfast. 07/31/21   Shamleffer, Konrad Dolores, MD      Allergies     Patient has no known allergies.    Review of Systems   Review of Systems Negative except as per HPI Physical Exam Updated Vital Signs BP (!) 144/86 (BP Location: Left Arm)   Pulse 65   Temp 98.1 F (36.7 C) (Oral)   Resp 18   Ht 5\' 3"  (1.6 m)   Wt 54.4 kg   SpO2 100%   BMI 21.26 kg/m  Physical Exam Vitals and nursing note reviewed.  Constitutional:      General: She is not in acute distress.    Appearance: She is well-developed. She is not diaphoretic.  HENT:     Head: Normocephalic and atraumatic.  Pulmonary:     Effort: Pulmonary effort is normal.  Musculoskeletal:        General: No swelling or tenderness.     Right lower leg: No edema.     Left lower leg: No edema.  Skin:    General: Skin is warm and dry.     Findings: Rash present.     Comments: Rash to lower extremities seems to be focused at hair follicles.  Nonspecific raised rash to upper extremities and trunk.  No rash noted to scalp, palms, soles of feet.  Neurological:     Mental Status: She is alert and oriented to person, place, and time.     Sensory: No sensory deficit.  Motor: No weakness.  Psychiatric:        Behavior: Behavior normal.     ED Results / Procedures / Treatments   Labs (all labs ordered are listed, but only abnormal results are displayed) Labs Reviewed - No data to display  EKG None  Radiology No results found.  Procedures Procedures    Medications Ordered in ED Medications - No data to display  ED Course/ Medical Decision Making/ A&P                           Medical Decision Making Risk Prescription drug management.   76 year old female with concern for rash as above.  Not responding to her topical steroid cream.  On exam, nonspecific rash to upper extremities and trunk however rash to lower extremities does appear to be consistent with folliculitis.  Given extent of rash, will treat with oral Keflex.  Recommend patient recheck with her primary care provider.   Also given hydroxyzine for her itching.        Final Clinical Impression(s) / ED Diagnoses Final diagnoses:  Rash  Folliculitis    Rx / DC Orders ED Discharge Orders          Ordered    cephALEXin (KEFLEX) 250 MG capsule  3 times daily        04/12/22 1132    hydrOXYzine (ATARAX) 10 MG tablet  Every 6 hours PRN        04/12/22 1133              Jeannie Fend, PA-C 04/12/22 1144    Terrilee Files, MD 04/12/22 [phone]

## 2022-04-12 NOTE — Discharge Instructions (Signed)
Take Keflex as prescribed and complete the full course. Take hydroxyzine as needed as prescribed for itching.  This medication can make you drowsy, do not take if you are driving. Recheck with your primary care provider.

## 2022-04-12 NOTE — ED Notes (Signed)
Discharge instructions reviewed with patient. Patient verbalizes understanding, no further questions at this time. Medications/prescriptions and follow up information provided. No acute distress noted at time of departure.  

## 2022-04-16 ENCOUNTER — Telehealth: Payer: Self-pay | Admitting: Internal Medicine

## 2022-04-16 NOTE — Telephone Encounter (Signed)
Okay to use 3:20pm appt tomorrow- 6/21.

## 2022-04-16 NOTE — Telephone Encounter (Signed)
Pt was seen at the ED this weekend for a rash, they recommended she follow up with pcp. She would like to follow up a soon as possible as she feels sxs are not getting better. Advised pt he is fully booked this and she could be seen by another provider sooner.  She declined and asked a message be sent to see what pcp can do. Please advise.

## 2022-04-17 ENCOUNTER — Ambulatory Visit (INDEPENDENT_AMBULATORY_CARE_PROVIDER_SITE_OTHER): Payer: Medicare HMO | Admitting: Internal Medicine

## 2022-04-17 ENCOUNTER — Encounter: Payer: Self-pay | Admitting: Internal Medicine

## 2022-04-17 VITALS — BP 136/76 | HR 84 | Temp 97.6°F | Resp 16 | Ht 63.0 in | Wt 125.5 lb

## 2022-04-17 DIAGNOSIS — L209 Atopic dermatitis, unspecified: Secondary | ICD-10-CM

## 2022-04-17 DIAGNOSIS — T7840XD Allergy, unspecified, subsequent encounter: Secondary | ICD-10-CM | POA: Diagnosis not present

## 2022-04-17 MED ORDER — PREDNISONE 10 MG PO TABS: ORAL_TABLET | ORAL | 0 refills | Status: DC

## 2022-04-17 MED ORDER — HYDROXYZINE HCL 10 MG PO TABS: 10.0000 mg | ORAL_TABLET | Freq: Three times a day (TID) | ORAL | 0 refills | Status: DC | PRN

## 2022-04-17 MED ORDER — LEVOTHYROXINE SODIUM 75 MCG PO TABS: 75.0000 ug | ORAL_TABLET | Freq: Every day | ORAL | Status: AC

## 2022-04-17 NOTE — Patient Instructions (Addendum)
We are referring you to a allergist   If you do not hear from them in the next few days please let me know  Okay to stop the antibiotic called Keflex.  Take prednisone as prescribed for few days  For itching, take hydroxyzine every 12 to 8 hours as needed.  Call if severe symptoms  Seek medical attention if severe lip or tongue swelling

## 2022-04-17 NOTE — Progress Notes (Signed)
Subjective:    Patient ID: Ann Bell, female    DOB: 04-12-1946, 76 y.o.   MRN: [id number]  DOS:  04/17/2022 Type of visit - description: ER follow-up  Seen at the ER 04/12/2022: She reports a history of itchy scalp for more than a year however symptoms really got severe before the ER visit: Generalized pruritus and redness of the skin without hives;  sxs particularly noticeable in areas where pressure is applied (for instance around the belt). + Dermatographism Her lip was slightly swollen she said. She denies cough or wheezing. No fever or chills.  Denies any new pets, detergents, foods. New medications?:  NP thyroid was switched to levothyroxine several weeks ago but otherwise denies taking any new medications or supplements.  Exam at the ER show a rash mostly in the lower extremities and seems to be concentrated in the follicles. Folliculitis?  Rx Keflex.  Also hydroxyzine for itching.  She has noted no major improvement except that hydroxyzine every 6 hours does help the itching.    Wt Readings from Last 3 Encounters:  04/17/22 125 lb 8 oz (56.9 kg)  04/12/22 120 lb (54.4 kg)  09/18/21 132 lb (59.9 kg)    Review of Systems Some weight loss noted, she think is related to increased exercise.  Past Medical History:  Diagnosis Date   Anemia    Arthritis    Chronic back pain    spondylolisthesis   Glaucoma    Headache    History of blood transfusion    no abnormal reaction noted   History of colon polyps    benign   Hypothyroidism    takes NP Thyroid   Perforated ulcer (HCC)    Peripheral neuropathy    Pneumonia    hx of-yrs ago   Primary localized osteoarthritis of left knee 04/14/2018    Past Surgical History:  Procedure Laterality Date   ABDOMINAL HYSTERECTOMY  1973   still has one ovary   ANTERIOR LAT LUMBAR FUSION N/A 02/28/2017   Procedure: Lumbar four-five Transpsoas lateral interbody fusion with Lumbar four-five Percutaneous pedicle screw fixation,  posterolateral arthrodesis with  Mazor ;  Surgeon: Ditty, Loura Halt, MD;  Location: [facility] OR;  Service: Neurosurgery;  Laterality: N/A;   APPENDECTOMY  1965   APPLICATION OF ROBOTIC ASSISTANCE FOR SPINAL PROCEDURE N/A 02/28/2017   Procedure: APPLICATION OF ROBOTIC ASSISTANCE FOR SPINAL PROCEDURE;  Surgeon: Ditty, Loura Halt, MD;  Location: [facility] OR;  Service: Neurosurgery;  Laterality: N/A;   cataract surgery Bilateral    CESAREAN SECTION     x2, 1968, 1972   COLON SURGERY  2018   hemi-colectomy resection   COLONOSCOPY WITH ESOPHAGOGASTRODUODENOSCOPY (EGD)     EPIDURAL BLOCK INJECTION     several times   ganglion cyst removed from throat     JOINT REPLACEMENT     LUMBAR PERCUTANEOUS PEDICLE SCREW 1 LEVEL N/A 02/28/2017   Procedure: LUMBAR PERCUTANEOUS PEDICLE SCREW LUMBAR FOUR-FIVE;  Surgeon: Ditty, Loura Halt, MD;  Location: [facility] OR;  Service: Neurosurgery;  Laterality: N/A;   OVARIAN CYST REMOVAL  1965   PARTIAL KNEE ARTHROPLASTY Left 04/14/2018   Procedure: UNICOMPARTMENTAL KNEE;  Surgeon: Teryl Lucy, MD;  Location: [facility] OR;  Service: Orthopedics;  Laterality: Left;   REPLACEMENT UNICONDYLAR JOINT KNEE Left 04/14/2018   TONSILLECTOMY AND ADENOIDECTOMY  1950    Current Outpatient Medications  Medication Instructions   cephALEXin (KEFLEX) 250 mg, Oral, 3 times daily   hydrOXYzine (ATARAX) 10 mg, Oral, Every 6  hours PRN   latanoprost (XALATAN) 0.005 % ophthalmic solution 1 drop, Both Eyes, Daily at bedtime   Multiple Vitamin (MULTIVITAMIN WITH MINERALS) TABS tablet 1 tablet, Oral, Daily   NALTREXONE HCL PO 4.5 mg, Oral, Daily   Nutritional Supplements (DHEA PO) 1 capsule, Oral, Daily   progesterone (PROMETRIUM) 100 mg, Oral, Daily   Testosterone 0.25 mg, Does not apply, Daily   thyroid (NP THYROID) 60 mg, Oral, Daily before breakfast       Objective:   Physical Exam BP 136/76   Pulse 84   Temp 97.6 F (36.4 C) (Oral)   Resp 16   Ht 5\' 3"  (1.6 m)   Wt 125 lb 8 oz (56.9  kg)   SpO2 96%   BMI 22.23 kg/m  General:   Well developed, NAD, BMI noted. HEENT:  Normocephalic . Face symmetric, atraumatic.  Lips normal Lungs:  CTA B Normal respiratory effort, no intercostal retractions, no accessory muscle use. Heart: RRR,  no murmur.  Lower extremities: no pretibial edema bilaterally  Skin:  Few areas of macular erythema at this point mostly at the neck and chest.  Back is essentially clear. Lower extremities: As reported by the ER doctor, some redness around the follicles are noted.  See picture Neurologic:  alert & oriented X3.  Speech normal, gait appropriate for age and unassisted Psych--  Cognition and judgment appear intact.  Cooperative with normal attention span and concentration.  Behavior appropriate. No anxious or depressed appearing.       Assessment      Assessment (new patient 04/2016, referred by 05/2016). Prediabetes Hypothyroidism DJD Menopause  GI:  - GERD, constipation - 06-2016: Pneumoperitoneum due to perforated duodenal ulcer.  Nonsurgical treatment. - 11-2016: Epigastric pain, had a EGD and colonoscopy, + gastritis, negative H. pylori, fundic gland polyps.  Mild duodenitis. Large tubular adenoma - Right colectomy 03/2017 Glaucoma Follows by 04/2017 NP, a holistic nurse practitioner  PLAN: Allergic reaction: Generalized pruritus and redness with no apparent hives. Etiology unclear. Plan: Stop Keflex as rx at the ER, I am not sure she has folliculitis. Round of prednisone Continue Atarax 3 times daily as needed Refer to allergist.  If severe symptoms seek medical attention (shortness of breath, lip swelling). Patient verbalized understanding. Hypothyroidism: Managed elsewhere, encouraged to find out her most recent TSH with the other provider.  I am concerned about her weight loss, TSH may be suppressed.

## 2022-04-18 NOTE — Assessment & Plan Note (Signed)
Allergic reaction: Generalized pruritus and redness with no apparent hives. Etiology unclear. Plan: Stop Keflex as rx at the ER, I am not sure she has folliculitis. Round of prednisone Continue Atarax 3 times daily as needed Refer to allergist.  If severe symptoms seek medical attention (shortness of breath, lip swelling). Patient verbalized understanding. Hypothyroidism: Managed elsewhere, encouraged to find out her most recent TSH with the other provider.  I am concerned about her weight loss, TSH may be suppressed.

## 2022-05-16 DIAGNOSIS — L2089 Other atopic dermatitis: Secondary | ICD-10-CM | POA: Diagnosis not present

## 2022-05-17 DIAGNOSIS — E039 Hypothyroidism, unspecified: Secondary | ICD-10-CM | POA: Diagnosis not present

## 2022-06-04 ENCOUNTER — Ambulatory Visit: Payer: Self-pay | Admitting: Internal Medicine

## 2022-06-21 DIAGNOSIS — H0102A Squamous blepharitis right eye, upper and lower eyelids: Secondary | ICD-10-CM | POA: Diagnosis not present

## 2022-06-21 DIAGNOSIS — H11441 Conjunctival cysts, right eye: Secondary | ICD-10-CM | POA: Diagnosis not present

## 2022-06-21 DIAGNOSIS — H26493 Other secondary cataract, bilateral: Secondary | ICD-10-CM | POA: Diagnosis not present

## 2022-06-21 DIAGNOSIS — H401132 Primary open-angle glaucoma, bilateral, moderate stage: Secondary | ICD-10-CM | POA: Diagnosis not present

## 2022-06-24 ENCOUNTER — Ambulatory Visit: Payer: Self-pay | Admitting: Internal Medicine

## 2022-06-25 ENCOUNTER — Encounter: Payer: Medicare HMO | Admitting: Internal Medicine

## 2022-07-04 ENCOUNTER — Encounter: Payer: Self-pay | Admitting: Internal Medicine

## 2022-07-04 ENCOUNTER — Ambulatory Visit (INDEPENDENT_AMBULATORY_CARE_PROVIDER_SITE_OTHER): Payer: Medicare HMO | Admitting: Internal Medicine

## 2022-07-04 VITALS — BP 132/80 | HR 87 | Temp 98.3°F | Resp 16 | Ht 63.0 in | Wt 125.0 lb

## 2022-07-04 DIAGNOSIS — Z0000 Encounter for general adult medical examination without abnormal findings: Secondary | ICD-10-CM | POA: Diagnosis not present

## 2022-07-04 DIAGNOSIS — Z1211 Encounter for screening for malignant neoplasm of colon: Secondary | ICD-10-CM | POA: Diagnosis not present

## 2022-07-04 DIAGNOSIS — E039 Hypothyroidism, unspecified: Secondary | ICD-10-CM

## 2022-07-04 DIAGNOSIS — R739 Hyperglycemia, unspecified: Secondary | ICD-10-CM

## 2022-07-04 LAB — LIPID PANEL
Cholesterol: 205 mg/dL — ABNORMAL HIGH (ref 0–200)
HDL: 57.1 mg/dL (ref >39.00)
LDL Cholesterol: 111 mg/dL — ABNORMAL HIGH (ref 0–99)
NonHDL: 147.5
Total CHOL/HDL Ratio: 4
Triglycerides: 184 mg/dL — ABNORMAL HIGH (ref 0.0–149.0)
VLDL: 36.8 mg/dL (ref 0.0–40.0)

## 2022-07-04 LAB — CBC WITH DIFFERENTIAL/PLATELET
Basophils Absolute: 0.1 10*3/uL (ref 0.0–0.1)
Basophils Relative: 1.3 % (ref 0.0–3.0)
Eosinophils Absolute: 0.3 10*3/uL (ref 0.0–0.7)
Eosinophils Relative: 4.3 % (ref 0.0–5.0)
HCT: 44.4 % (ref 36.0–46.0)
Hemoglobin: 14.8 g/dL (ref 12.0–15.0)
Lymphocytes Relative: 24.9 % (ref 12.0–46.0)
Lymphs Abs: 1.9 10*3/uL (ref 0.7–4.0)
MCHC: 33.2 g/dL (ref 30.0–36.0)
MCV: 89.9 fl (ref 78.0–100.0)
Monocytes Absolute: 1 10*3/uL (ref 0.1–1.0)
Monocytes Relative: 13 % — ABNORMAL HIGH (ref 3.0–12.0)
Neutro Abs: 4.3 10*3/uL (ref 1.4–7.7)
Neutrophils Relative %: 56.5 % (ref 43.0–77.0)
Platelets: 143 10*3/uL — ABNORMAL LOW (ref 150.0–400.0)
RBC: 4.94 Mil/uL (ref 3.87–5.11)
RDW: 14.1 % (ref 11.5–15.5)
WBC: 7.5 10*3/uL (ref 4.0–10.5)

## 2022-07-04 LAB — COMPREHENSIVE METABOLIC PANEL
ALT: 17 U/L (ref 0–35)
AST: 23 U/L (ref 0–37)
Albumin: 4.3 g/dL (ref 3.5–5.2)
Alkaline Phosphatase: 48 U/L (ref 39–117)
BUN: 17 mg/dL (ref 6–23)
CO2: 30 mEq/L (ref 19–32)
Calcium: 9.4 mg/dL (ref 8.4–10.5)
Chloride: 100 mEq/L (ref 96–112)
Creatinine, Ser: 0.73 mg/dL (ref 0.40–1.20)
GFR: 78.73 mL/min (ref >60.00)
Glucose, Bld: 94 mg/dL (ref 70–99)
Potassium: 4 mEq/L (ref 3.5–5.1)
Sodium: 137 mEq/L (ref 135–145)
Total Bilirubin: 0.5 mg/dL (ref 0.2–1.2)
Total Protein: 6.8 g/dL (ref 6.0–8.3)

## 2022-07-04 LAB — HEMOGLOBIN A1C: Hgb A1c MFr Bld: 6.1 % (ref 4.6–6.5)

## 2022-07-04 NOTE — Assessment & Plan Note (Signed)
Here for CPX Prediabetes: Check A1c Hypothyroidism: Managed elsewhere, currently on Synthroid.  Encouraged to get her blood checked regularly. She also take testosterone and other supplements.  Encouraged to discuss risk - benefits with  prescriber. DJD: See HPI, recommend Voltaren gel for right wrist, avoidance of certain movements for shoulder pain and Tylenol. Allergic reaction: See LOV, symptoms quickly resolved, saw dermatology, allergist visit pending. RTC 1 year

## 2022-07-04 NOTE — Patient Instructions (Addendum)
Recommend to proceed with the following vaccines at your pharmacy:  Covid booster (bivalent)  Flu shot-high dose  For pain at the right thumb: Voltaren gel twice daily as needed For pain at the R shoulder: Avoid movements that trigger the pain.  Tylenol as needed.  Call for a referral to sports medicine if needed.  Get your thyroid checked regularly Discussed risk and benefits of taking testosterone with your other provider  We will refer you to the gastroenterologist for consideration of another colonoscopy  Recommend to get a healthcare power of attorney copy to [place] to be scanned in your chart   GO TO THE LAB : Get the blood work     GO TO THE FRONT DESK, PLEASE SCHEDULE YOUR APPOINTMENTS Come back for a physical exam in 1 year

## 2022-07-04 NOTE — Progress Notes (Signed)
Subjective:    Patient ID: Ann Bell, female    DOB: 07-24-1946, 76 y.o.   MRN: [id number]  DOS:  07/04/2022 Type of visit - description: cpx Here for CPX. Also complaining of chronic pain at the base of the right thumb. Also started an exercise program at the gym, has developed anterior right shoulder pain with certain movements.  Symptoms are not severe. Has a chronic discomfort at the right posterior neck, no change for years. Was seen recently with an allergic reaction, symptoms quickly resolved.   Review of Systems  Other than above, a 14 point review of systems is negative     Past Medical History:  Diagnosis Date   Anemia    Arthritis    Chronic back pain    spondylolisthesis   Glaucoma    Headache    History of blood transfusion    no abnormal reaction noted   History of colon polyps    benign   Hypothyroidism    takes NP Thyroid   Perforated ulcer (HCC)    Peripheral neuropathy    Pneumonia    hx of-yrs ago   Primary localized osteoarthritis of left knee 04/14/2018    Past Surgical History:  Procedure Laterality Date   ABDOMINAL HYSTERECTOMY  1973   still has one ovary   ANTERIOR LAT LUMBAR FUSION N/A 02/28/2017   Procedure: Lumbar four-five Transpsoas lateral interbody fusion with Lumbar four-five Percutaneous pedicle screw fixation, posterolateral arthrodesis with  Mazor ;  Surgeon: Ditty, Loura Halt, MD;  Location: [facility] OR;  Service: Neurosurgery;  Laterality: N/A;   APPENDECTOMY  1965   APPLICATION OF ROBOTIC ASSISTANCE FOR SPINAL PROCEDURE N/A 02/28/2017   Procedure: APPLICATION OF ROBOTIC ASSISTANCE FOR SPINAL PROCEDURE;  Surgeon: Ditty, Loura Halt, MD;  Location: [facility] OR;  Service: Neurosurgery;  Laterality: N/A;   cataract surgery Bilateral    CESAREAN SECTION     x2, 1968, 1972   COLON SURGERY  2018   hemi-colectomy resection   COLONOSCOPY WITH ESOPHAGOGASTRODUODENOSCOPY (EGD)     EPIDURAL BLOCK INJECTION     several times   ganglion cyst  removed from throat     JOINT REPLACEMENT     LUMBAR PERCUTANEOUS PEDICLE SCREW 1 LEVEL N/A 02/28/2017   Procedure: LUMBAR PERCUTANEOUS PEDICLE SCREW LUMBAR FOUR-FIVE;  Surgeon: Ditty, Loura Halt, MD;  Location: [facility] OR;  Service: Neurosurgery;  Laterality: N/A;   OVARIAN CYST REMOVAL  1965   PARTIAL KNEE ARTHROPLASTY Left 04/14/2018   Procedure: UNICOMPARTMENTAL KNEE;  Surgeon: Teryl Lucy, MD;  Location: [facility] OR;  Service: Orthopedics;  Laterality: Left;   REPLACEMENT UNICONDYLAR JOINT KNEE Left 04/14/2018   TONSILLECTOMY AND ADENOIDECTOMY  1950   Social History   Socioeconomic History   Marital status: Married    Spouse name: Not on file   Number of children: 2   Years of education: Not on file   Highest education level: Not on file  Occupational History   Occupation: n/a  Tobacco Use   Smoking status: Former    Packs/day: 0.50    Years: 20.00    Total pack years: 10.00    Types: Cigarettes    Quit date: 06/07/1978    Years since quitting: 44.1   Smokeless tobacco: Never  Vaping Use   Vaping Use: Never used  Substance and Sexual Activity   Alcohol use: Yes    Comment: socially    Drug use: No   Sexual activity: Not Currently  Other Topics Concern  Not on file  Social History Narrative   Lives w/ husband   Social Determinants of Health   Financial Resource Strain: Low Risk  (09/18/2021)   Overall Financial Resource Strain (CARDIA)    Difficulty of Paying Living Expenses: Not hard at all  Food Insecurity: No Food Insecurity (09/18/2021)   Hunger Vital Sign    Worried About Running Out of Food in the Last Year: Never true    Ran Out of Food in the Last Year: Never true  Transportation Needs: No Transportation Needs (09/18/2021)   PRAPARE - Administrator, Civil Service (Medical): No    Lack of Transportation (Non-Medical): No  Physical Activity: Sufficiently Active (09/18/2021)   Exercise Vital Sign    Days of Exercise per Week: 7 days    Minutes of  Exercise per Session: 60 min  Stress: No Stress Concern Present (09/18/2021)   [employer] of Occupational Health - Occupational Stress Questionnaire    Feeling of Stress : Only a little  Social Connections: Socially Integrated (09/18/2021)   Social Connection and Isolation Panel [NHANES]    Frequency of Communication with Friends and Family: More than three times a week    Frequency of Social Gatherings with Friends and Family: More than three times a week    Attends Religious Services: More than 4 times per year    Active Member of [employer] or Organizations: Yes    Attends Engineer, structural: More than 4 times per year    Marital Status: Married  Catering manager Violence: Not At Risk (09/18/2021)   Humiliation, Afraid, Rape, and Kick questionnaire    Fear of Current or Ex-Partner: No    Emotionally Abused: No    Physically Abused: No    Sexually Abused: No     Current Outpatient Medications  Medication Instructions   hydrOXYzine (ATARAX) 10 mg, Oral, Every 8 hours PRN   latanoprost (XALATAN) 0.005 % ophthalmic solution 1 drop, Both Eyes, Daily at bedtime   levothyroxine (SYNTHROID) 75 mcg, Oral, Daily   Multiple Vitamin (MULTIVITAMIN WITH MINERALS) TABS tablet 1 tablet, Oral, Daily   NALTREXONE HCL PO 4.5 mg, Oral, Daily   Nutritional Supplements (DHEA PO) 1 capsule, Oral, Daily   progesterone (PROMETRIUM) 100 mg, Oral, Daily   Testosterone 0.25 mg, Does not apply, Daily       Objective:   Physical Exam BP 132/80   Pulse 87   Temp 98.3 F (36.8 C) (Oral)   Resp 16   Ht 5\' 3"  (1.6 m)   Wt 125 lb (56.7 kg)   SpO2 98%   BMI 22.14 kg/m  General: Well developed, NAD, BMI noted Neck: No  thyromegaly  HEENT:  Normocephalic . Face symmetric, atraumatic Lungs:  CTA B Normal respiratory effort, no intercostal retractions, no accessory muscle use. Heart: RRR,  no murmur.  Abdomen:  Not distended, soft, non-tender. No rebound or rigidity.   Lower  extremities: no pretibial edema bilaterally MSK: Wrists with DJD changes bilaterally, no effusion.  Range of motion is appropriate Shoulders: Bilateral ROM appropriate.  Normal to palpation. Skin: Exposed areas without rash. Not pale. Not jaundice Neurologic:  alert & oriented X3.  Speech normal, gait appropriate for age and unassisted Strength symmetric and appropriate for age.  Psych: Cognition and judgment appear intact.  Cooperative with normal attention span and concentration.  Behavior appropriate. No anxious or depressed appearing.     Assessment    Assessment (new patient 04/2016, referred by 05/2016).  Prediabetes Hypothyroidism DJD Menopause  GI:  - GERD, constipation - 06-2016: Pneumoperitoneum due to perforated duodenal ulcer.  Nonsurgical treatment. - 11-2016: Epigastric pain, had a EGD and colonoscopy, + gastritis, negative H. pylori, fundic gland polyps.  Mild duodenitis. Large tubular adenoma - Right colectomy 03/2017 Glaucoma Follows by Theodora Blow NP, a holistic nurse practitioner  PLAN: Here for CPX Prediabetes: Check A1c Hypothyroidism: Managed elsewhere, currently on Synthroid.  Encouraged to get her blood checked regularly. She also take testosterone and other supplements.  Encouraged to discuss risk - benefits with  prescriber. DJD: See HPI, recommend Voltaren gel for right wrist, avoidance of certain movements for shoulder pain and Tylenol. Allergic reaction: See LOV, symptoms quickly resolved, saw dermatology, allergist visit pending. RTC 1 year

## 2022-07-04 NOTE — Assessment & Plan Note (Signed)
Td 2019 PNM 13: 2018 PNM 23 2019 Zoster 2017 S/p shingrix x2  Covid booster rec  Flu shot recommended  Female care : done elsewhere ; reports not further PAPs MMG: no records  available to me, pt aware, rec MMG q year Bone density 10-2016: T score -1.6, 09-2021 T score - 1.4 Colonoscopy: 2018, next per GI  @ [facility].  Refer to GI. Labs: CMP, FLP, CBC, A1c Has a POA, recommend to send a copy.

## 2022-07-30 ENCOUNTER — Ambulatory Visit: Payer: Medicare HMO | Admitting: Internal Medicine

## 2022-09-12 DIAGNOSIS — L649 Androgenic alopecia, unspecified: Secondary | ICD-10-CM | POA: Diagnosis not present

## 2022-09-12 DIAGNOSIS — Z780 Asymptomatic menopausal state: Secondary | ICD-10-CM | POA: Diagnosis not present

## 2022-09-12 DIAGNOSIS — Z1589 Genetic susceptibility to other disease: Secondary | ICD-10-CM | POA: Diagnosis not present

## 2022-09-12 DIAGNOSIS — R5382 Chronic fatigue, unspecified: Secondary | ICD-10-CM | POA: Diagnosis not present

## 2022-09-12 DIAGNOSIS — M2550 Pain in unspecified joint: Secondary | ICD-10-CM | POA: Diagnosis not present

## 2022-09-12 DIAGNOSIS — R7303 Prediabetes: Secondary | ICD-10-CM | POA: Diagnosis not present

## 2022-09-12 DIAGNOSIS — E039 Hypothyroidism, unspecified: Secondary | ICD-10-CM | POA: Diagnosis not present

## 2022-09-12 DIAGNOSIS — E785 Hyperlipidemia, unspecified: Secondary | ICD-10-CM | POA: Diagnosis not present

## 2022-09-12 DIAGNOSIS — R799 Abnormal finding of blood chemistry, unspecified: Secondary | ICD-10-CM | POA: Diagnosis not present

## 2022-09-23 ENCOUNTER — Ambulatory Visit: Payer: Medicare HMO

## 2022-10-18 DIAGNOSIS — H401132 Primary open-angle glaucoma, bilateral, moderate stage: Secondary | ICD-10-CM | POA: Diagnosis not present

## 2022-10-18 DIAGNOSIS — Z961 Presence of intraocular lens: Secondary | ICD-10-CM | POA: Diagnosis not present

## 2022-10-18 DIAGNOSIS — H26493 Other secondary cataract, bilateral: Secondary | ICD-10-CM | POA: Diagnosis not present

## 2022-10-18 DIAGNOSIS — H04123 Dry eye syndrome of bilateral lacrimal glands: Secondary | ICD-10-CM | POA: Diagnosis not present

## 2023-01-07 ENCOUNTER — Telehealth: Payer: Self-pay | Admitting: Internal Medicine

## 2023-01-07 NOTE — Telephone Encounter (Signed)
Copied from [place] [phone]. Topic: Medicare AWV >> Jan 07, 2023  9:12 AM Devoria Glassing wrote: Reason for CRM: Called patient to schedule Medicare Annual Wellness Visit (AWV). Left message for patient to call back and schedule Medicare Annual Wellness Visit (AWV).  Last date of AWV: 09/08/21  Please schedule an appointment at any time with Beatris Ship, [place] .  If any questions, please contact me.  Thank you ,  Sherol Dade; [place] Direct Dial: [phone]

## 2023-01-08 ENCOUNTER — Telehealth: Payer: Self-pay | Admitting: Internal Medicine

## 2023-01-08 NOTE — Telephone Encounter (Signed)
Contacted Winfield Rast to schedule their annual wellness visit. Appointment made for 01/15/2023.  Sherol Dade; Care Guide [facility] Direct Dial: [phone]

## 2023-01-15 ENCOUNTER — Ambulatory Visit: Payer: Medicare Other

## 2023-02-06 ENCOUNTER — Encounter: Payer: Self-pay | Admitting: Internal Medicine

## 2023-03-13 ENCOUNTER — Ambulatory Visit (INDEPENDENT_AMBULATORY_CARE_PROVIDER_SITE_OTHER): Payer: Medicare Other | Admitting: *Deleted

## 2023-03-13 DIAGNOSIS — Z0000 Encounter for general adult medical examination without abnormal findings: Secondary | ICD-10-CM

## 2023-03-13 NOTE — Progress Notes (Signed)
Subjective:   Ann Bell is a 77 y.o. female who presents for Medicare Annual (Subsequent) preventive examination.  I connected with  Ann Bell on 03/13/23 by a audio enabled telemedicine application and verified that I am speaking with the correct person using two identifiers.  Patient Location: Home  Provider Location: Office/Clinic  I discussed the limitations of evaluation and management by telemedicine. The patient expressed understanding and agreed to proceed.   Review of Systems     Cardiac Risk Factors include: advanced age (>76men, >61 women)     Objective:    There were no vitals filed for this visit. There is no height or weight on file to calculate BMI.     03/13/2023    1:45 PM 04/12/2022   10:10 AM 09/18/2021   10:29 AM 04/03/2018    2:09 PM 05/16/2017   10:10 AM 03/11/2017    8:00 PM 03/10/2017    9:05 AM  Advanced Directives  Does Patient Have a Medical Advance Directive? Yes Yes Yes Yes Yes  Yes  Type of Estate agent of [place];Living will Living will;Healthcare Power of [employer] Power of [place];Living will Healthcare Power of [place];Living will   Living will  Does patient want to make changes to medical advance directive? No - Patient declined   No - Patient declined  No - Patient declined   Copy of Healthcare Power of Attorney in Chart? No - copy requested No - copy requested No - copy requested No - copy requested   Yes    Current Medications (verified) Outpatient Encounter Medications as of 03/13/2023  Medication Sig   latanoprost (XALATAN) 0.005 % ophthalmic solution Place 1 drop into both eyes at bedtime.   levothyroxine (SYNTHROID) 75 MCG tablet Take 1 tablet (75 mcg total) by mouth daily.   Multiple Vitamin (MULTIVITAMIN WITH MINERALS) TABS tablet Take 1 tablet by mouth daily.   NALTREXONE HCL PO Take 4.5 mg by mouth daily.   Nutritional Supplements (DHEA PO) Take 1 capsule by mouth daily.   progesterone  (PROMETRIUM) 100 MG capsule Take 100 mg by mouth daily.   Testosterone 20 % CREA 0.25 mg by Does not apply route daily.   [DISCONTINUED] hydrOXYzine (ATARAX) 10 MG tablet Take 1 tablet (10 mg total) by mouth every 8 (eight) hours as needed for itching. (Patient not taking: Reported on 07/04/2022)   No facility-administered encounter medications on file as of 03/13/2023.    Allergies (verified) Patient has no known allergies.   History: Past Medical History:  Diagnosis Date   Anemia    Arthritis    Chronic back pain    spondylolisthesis   Glaucoma    Headache    History of blood transfusion    no abnormal reaction noted   History of colon polyps    benign   Hypothyroidism    takes NP Thyroid   Perforated ulcer (HCC)    Peripheral neuropathy    Pneumonia    hx of-yrs ago   Primary localized osteoarthritis of left knee 04/14/2018   Past Surgical History:  Procedure Laterality Date   ABDOMINAL HYSTERECTOMY  1973   still has one ovary   ANTERIOR LAT LUMBAR FUSION N/A 02/28/2017   Procedure: Lumbar four-five Transpsoas lateral interbody fusion with Lumbar four-five Percutaneous pedicle screw fixation, posterolateral arthrodesis with  Mazor ;  Surgeon: Ditty, Loura Halt, MD;  Location: [facility] OR;  Service: Neurosurgery;  Laterality: N/A;   APPENDECTOMY  1965   APPLICATION  OF ROBOTIC ASSISTANCE FOR SPINAL PROCEDURE N/A 02/28/2017   Procedure: APPLICATION OF ROBOTIC ASSISTANCE FOR SPINAL PROCEDURE;  Surgeon: Ditty, Loura Halt, MD;  Location: [facility] OR;  Service: Neurosurgery;  Laterality: N/A;   cataract surgery Bilateral    CESAREAN SECTION     x2, 1968, 1972   COLON SURGERY  2018   hemi-colectomy resection   COLONOSCOPY WITH ESOPHAGOGASTRODUODENOSCOPY (EGD)     EPIDURAL BLOCK INJECTION     several times   ganglion cyst removed from throat     JOINT REPLACEMENT     LUMBAR PERCUTANEOUS PEDICLE SCREW 1 LEVEL N/A 02/28/2017   Procedure: LUMBAR PERCUTANEOUS PEDICLE SCREW LUMBAR  FOUR-FIVE;  Surgeon: Ditty, Loura Halt, MD;  Location: [facility] OR;  Service: Neurosurgery;  Laterality: N/A;   OVARIAN CYST REMOVAL  1965   PARTIAL KNEE ARTHROPLASTY Left 04/14/2018   Procedure: UNICOMPARTMENTAL KNEE;  Surgeon: Teryl Lucy, MD;  Location: [facility] OR;  Service: Orthopedics;  Laterality: Left;   REPLACEMENT UNICONDYLAR JOINT KNEE Left 04/14/2018   TONSILLECTOMY AND ADENOIDECTOMY  1950   Family History  Problem Relation Age of Onset   Diabetes Mother    Breast cancer Maternal Grandmother    Liver disease Paternal Grandfather    Hearing loss Father    Colon cancer Neg Hx    CAD Neg Hx    Social History   Socioeconomic History   Marital status: Married    Spouse name: Not on file   Number of children: 2   Years of education: Not on file   Highest education level: Not on file  Occupational History   Occupation: n/a  Tobacco Use   Smoking status: Former    Packs/day: 0.50    Years: 20.00    Additional pack years: 0.00    Total pack years: 10.00    Types: Cigarettes    Quit date: 06/07/1978    Years since quitting: 44.7   Smokeless tobacco: Never  Vaping Use   Vaping Use: Never used  Substance and Sexual Activity   Alcohol use: Yes    Comment: socially    Drug use: No   Sexual activity: Not Currently  Other Topics Concern   Not on file  Social History Narrative   Lives w/ husband   Social Determinants of Health   Financial Resource Strain: Low Risk  (09/18/2021)   Overall Financial Resource Strain (CARDIA)    Difficulty of Paying Living Expenses: Not hard at all  Food Insecurity: No Food Insecurity (03/13/2023)   Hunger Vital Sign    Worried About Running Out of Food in the Last Year: Never true    Ran Out of Food in the Last Year: Never true  Transportation Needs: No Transportation Needs (03/13/2023)   PRAPARE - Administrator, Civil Service (Medical): No    Lack of Transportation (Non-Medical): No  Physical Activity: Sufficiently Active  (09/18/2021)   Exercise Vital Sign    Days of Exercise per Week: 7 days    Minutes of Exercise per Session: 60 min  Stress: No Stress Concern Present (09/18/2021)   [employer] of Occupational Health - Occupational Stress Questionnaire    Feeling of Stress : Only a little  Social Connections: Socially Integrated (09/18/2021)   Social Connection and Isolation Panel [NHANES]    Frequency of Communication with Friends and Family: More than three times a week    Frequency of Social Gatherings with Friends and Family: More than three times a week    Attends  Religious Services: More than 4 times per year    Active Member of Clubs or Organizations: Yes    Attends Banker Meetings: More than 4 times per year    Marital Status: Married    Tobacco Counseling Counseling given: Not Answered   Clinical Intake:  Pre-visit preparation completed: Yes  Pain : No/denies pain  Nutritional Risks: None Diabetes: No  How often do you need to have someone help you when you read instructions, pamphlets, or other written materials from your doctor or pharmacy?: 1 - Never  Activities of Daily Living    03/13/2023    1:48 PM  In your present state of health, do you have any difficulty performing the following activities:  Hearing? 0  Vision? 0  Difficulty concentrating or making decisions? 1  Comment occasionally forgets names  Walking or climbing stairs? 0  Dressing or bathing? 0  Doing errands, shopping? 0  Preparing Food and eating ? N  Using the Toilet? N  In the past six months, have you accidently leaked urine? Y  Comment stress incontinence  Do you have problems with loss of bowel control? N  Managing your Medications? N  Managing your Finances? N  Housekeeping or managing your Housekeeping? N    Patient Care Team: Wanda Plump, MD as PCP - General (Internal Medicine) Teryl Lucy, MD as Consulting Physician (Orthopedic Surgery) Campbell Lerner, MD as  Referring Physician (Surgery) Nelson Chimes, MD as Consulting Physician (Ophthalmology) Bridgette Habermann, NP as Nurse Practitioner  Indicate any recent Medical Services you may have received from other than [facility] providers in the past year (date may be approximate).     Assessment:   This is a routine wellness examination for Ann Bell.  Hearing/Vision screen No results found.  Dietary issues and exercise activities discussed: Current Exercise Habits: Structured exercise class, Type of exercise: strength training/weights;calisthenics;yoga, Time (Minutes): > 60, Frequency (Times/Week): 5, Weekly Exercise (Minutes/Week): 0, Intensity: Intense, Exercise limited by: None identified   Goals Addressed   None    Depression Screen    03/13/2023    1:48 PM 07/04/2022    9:12 AM 04/17/2022    3:32 PM 09/18/2021   10:32 AM 06/19/2021   10:04 AM 08/16/2019    9:04 AM 05/13/2018    3:16 PM  PHQ 2/9 Scores  PHQ - 2 Score 0 0 0 0 0 0 0    Fall Risk    03/13/2023    1:46 PM 07/04/2022    9:12 AM 04/17/2022    3:32 PM 09/18/2021   10:30 AM 06/19/2021   10:04 AM  Fall Risk   Falls in the past year? 0 0 0 0 0  Number falls in past yr: 0 0 0 0 0  Injury with Fall? 0 0 0 0 0  Risk for fall due to : No Fall Risks      Follow up Falls evaluation completed Falls evaluation completed Falls evaluation completed Falls prevention discussed Falls evaluation completed    FALL RISK PREVENTION PERTAINING TO THE HOME:  Any stairs in or around the home? Yes  If so, are there any without handrails? No  Home free of loose throw rugs in walkways, pet beds, electrical cords, etc? Yes  Adequate lighting in your home to reduce risk of falls? Yes   ASSISTIVE DEVICES UTILIZED TO PREVENT FALLS:  Life alert? No  Use of a cane, walker or w/c? No  Grab bars in the bathroom? No  Shower  chair or bench in shower? No  Elevated toilet seat or a handicapped toilet?  Comfort height  TIMED UP AND GO:  Was the test  performed?  No, audio visit .    Cognitive Function:        03/13/2023    1:56 PM  6CIT Screen  What Year? 0 points  What month? 0 points  What time? 0 points  Count back from 20 0 points  Months in reverse 4 points  Repeat phrase 0 points  Total Score 4 points    Immunizations Immunization History  Administered Date(s) Administered   Fluad Quad(high Dose 65+) 07/24/2021   H1N1 10/13/2008   Influenza, High Dose Seasonal PF 09/02/2017   Influenza, Seasonal, Injecte, Preservative Fre 06/26/2019   Influenza,inj,Quad PF,6+ Mos 10/30/2013   Influenza-Unspecified 08/07/2018   PFIZER(Purple Top)SARS-COV-2 Vaccination 12/04/2019, 12/28/2019, 08/21/2020, 03/14/2021   Pfizer Covid-19 Vaccine Bivalent Booster 42yrs & up 08/07/2021   Pneumococcal Conjugate-13 10/30/2016   Pneumococcal Polysaccharide-23 08/13/2018   Td 08/13/2018   Zoster Recombinat (Shingrix) 06/26/2019, 08/27/2019   Zoster, Live 04/29/2013    TDAP status: Up to date  Flu Vaccine status: Up to date  Pneumococcal vaccine status: Completed during today's visit.  Covid-19 vaccine status: Information provided on how to obtain vaccines.   Qualifies for Shingles Vaccine? Yes   Zostavax completed Yes   Shingrix Completed?: No.    Education has been provided regarding the importance of this vaccine. Patient has been advised to call insurance company to determine out of pocket expense if they have not yet received this vaccine. Advised may also receive vaccine at local pharmacy or Health Dept. Verbalized acceptance and understanding.  Screening Tests Health Maintenance  Topic Date Due   COVID-19 Vaccine (6 - 2023-24 season) 06/28/2022   Medicare Annual Wellness (AWV)  09/18/2022   INFLUENZA VACCINE  05/29/2023   DTaP/Tdap/Td (2 - Tdap) 08/13/2028   Pneumonia Vaccine 77+ Years old  Completed   DEXA SCAN  Completed   Hepatitis C Screening  Completed   Zoster Vaccines- Shingrix  Completed   HPV VACCINES  Aged Out    COLONOSCOPY (Pts 45-32yrs Insurance coverage will need to be confirmed)  Discontinued    Health Maintenance  Health Maintenance Due  Topic Date Due   COVID-19 Vaccine (6 - 2023-24 season) 06/28/2022   Medicare Annual Wellness (AWV)  09/18/2022    Colorectal cancer screening: No longer required.   Mammogram status: No longer required due to age.  Bone Density status: Completed 10/01/21. Results reflect: Bone density results: OSTEOPENIA. Repeat every 2 years.  Lung Cancer Screening: (Low Dose CT Chest recommended if Age 31-80 years, 30 pack-year currently smoking OR have quit w/in 15years.) does not qualify.   Additional Screening:  Hepatitis C Screening: does qualify; Completed 08/13/18  Vision Screening: Recommended annual ophthalmology exams for early detection of glaucoma and other disorders of the eye. Is the patient up to date with their annual eye exam?  Yes  Who is the provider or what is the name of the office in which the patient attends annual eye exams? [facility]. If pt is not established with a provider, would they like to be referred to a provider to establish care? No .   Dental Screening: Recommended annual dental exams for proper oral hygiene  Community Resource Referral / Chronic Care Management: CRR required this visit?  No   CCM required this visit?  No      Plan:  I have personally reviewed and noted the following in the patient's chart:   Medical and social history Use of alcohol, tobacco or illicit drugs  Current medications and supplements including opioid prescriptions. Patient is not currently taking opioid prescriptions. Functional ability and status Nutritional status Physical activity Advanced directives List of other physicians Hospitalizations, surgeries, and ER visits in previous 12 months Vitals Screenings to include cognitive, depression, and falls Referrals and appointments  In addition, I have reviewed and  discussed with patient certain preventive protocols, quality metrics, and best practice recommendations. A written personalized care plan for preventive services as well as general preventive health recommendations were provided to patient.   Due to this being a telephonic visit, the after visit summary with patients personalized plan was offered to patient via mail or my-chart.  Patient would like to access on my-chart.  Donne Anon, [place]   03/13/2023   Nurse Notes: None

## 2023-03-13 NOTE — Patient Instructions (Signed)
Ann Bell , Thank you for taking time to come for your Medicare Wellness Visit. I appreciate your ongoing commitment to your health goals. Please review the following plan we discussed and let me know if I can assist you in the future.     This is a list of the screening recommended for you and due dates:  Health Maintenance  Topic Date Due   COVID-19 Vaccine (6 - 2023-24 season) 06/28/2022   Flu Shot  05/29/2023   Medicare Annual Wellness Visit  03/12/2024   DTaP/Tdap/Td vaccine (2 - Tdap) 08/13/2028   Pneumonia Vaccine  Completed   DEXA scan (bone density measurement)  Completed   Hepatitis C Screening: USPSTF Recommendation to screen - Ages 62-79 yo.  Completed   Zoster (Shingles) Vaccine  Completed   HPV Vaccine  Aged Out   Colon Cancer Screening  Discontinued    Next appointment: Follow up in one year for your annual wellness visit.   Preventive Care 41 Years and Older, Female Preventive care refers to lifestyle choices and visits with your health care provider that can promote health and wellness. What does preventive care include? A yearly physical exam. This is also called an annual well check. Dental exams once or twice a year. Routine eye exams. Ask your health care provider how often you should have your eyes checked. Personal lifestyle choices, including: Daily care of your teeth and gums. Regular physical activity. Eating a healthy diet. Avoiding tobacco and drug use. Limiting alcohol use. Practicing safe sex. Taking low-dose aspirin every day. Taking vitamin and mineral supplements as recommended by your health care provider. What happens during an annual well check? The services and screenings done by your health care provider during your annual well check will depend on your age, overall health, lifestyle risk factors, and family history of disease. Counseling  Your health care provider may ask you questions about your: Alcohol use. Tobacco use. Drug  use. Emotional well-being. Home and relationship well-being. Sexual activity. Eating habits. History of falls. Memory and ability to understand (cognition). Work and work Astronomer. Reproductive health. Screening  You may have the following tests or measurements: Height, weight, and BMI. Blood pressure. Lipid and cholesterol levels. These may be checked every 5 years, or more frequently if you are over 28 years old. Skin check. Lung cancer screening. You may have this screening every year starting at age 100 if you have a 30-pack-year history of smoking and currently smoke or have quit within the past 15 years. Fecal occult blood test (FOBT) of the stool. You may have this test every year starting at age 25. Flexible sigmoidoscopy or colonoscopy. You may have a sigmoidoscopy every 5 years or a colonoscopy every 10 years starting at age 55. Hepatitis C blood test. Hepatitis B blood test. Sexually transmitted disease (STD) testing. Diabetes screening. This is done by checking your blood sugar (glucose) after you have not eaten for a while (fasting). You may have this done every 1-3 years. Bone density scan. This is done to screen for osteoporosis. You may have this done starting at age 68. Mammogram. This may be done every 1-2 years. Talk to your health care provider about how often you should have regular mammograms. Talk with your health care provider about your test results, treatment options, and if necessary, the need for more tests. Vaccines  Your health care provider may recommend certain vaccines, such as: Influenza vaccine. This is recommended every year. Tetanus, diphtheria, and acellular pertussis (Tdap, Td)  vaccine. You may need a Td booster every 10 years. Zoster vaccine. You may need this after age 88. Pneumococcal 13-valent conjugate (PCV13) vaccine. One dose is recommended after age 49. Pneumococcal polysaccharide (PPSV23) vaccine. One dose is recommended after age  87. Talk to your health care provider about which screenings and vaccines you need and how often you need them. This information is not intended to replace advice given to you by your health care provider. Make sure you discuss any questions you have with your health care provider. Document Released: 11/10/2015 Document Revised: 07/03/2016 Document Reviewed: 08/15/2015 Elsevier Interactive Patient Education  2017 [employer].  Fall Prevention in the Home Falls can cause injuries. They can happen to people of all ages. There are many things you can do to make your home safe and to help prevent falls. What can I do on the outside of my home? Regularly fix the edges of walkways and driveways and fix any cracks. Remove anything that might make you trip as you walk through a door, such as a raised step or threshold. Trim any bushes or trees on the path to your home. Use bright outdoor lighting. Clear any walking paths of anything that might make someone trip, such as rocks or tools. Regularly check to see if handrails are loose or broken. Make sure that both sides of any steps have handrails. Any raised decks and porches should have guardrails on the edges. Have any leaves, snow, or ice cleared regularly. Use sand or salt on walking paths during winter. Clean up any spills in your garage right away. This includes oil or grease spills. What can I do in the bathroom? Use night lights. Install grab bars by the toilet and in the tub and shower. Do not use towel bars as grab bars. Use non-skid mats or decals in the tub or shower. If you need to sit down in the shower, use a plastic, non-slip stool. Keep the floor dry. Clean up any water that spills on the floor as soon as it happens. Remove soap buildup in the tub or shower regularly. Attach bath mats securely with double-sided non-slip rug tape. Do not have throw rugs and other things on the floor that can make you trip. What can I do in the  bedroom? Use night lights. Make sure that you have a light by your bed that is easy to reach. Do not use any sheets or blankets that are too big for your bed. They should not hang down onto the floor. Have a firm chair that has side arms. You can use this for support while you get dressed. Do not have throw rugs and other things on the floor that can make you trip. What can I do in the kitchen? Clean up any spills right away. Avoid walking on wet floors. Keep items that you use a lot in easy-to-reach places. If you need to reach something above you, use a strong step stool that has a grab bar. Keep electrical cords out of the way. Do not use floor polish or wax that makes floors slippery. If you must use wax, use non-skid floor wax. Do not have throw rugs and other things on the floor that can make you trip. What can I do with my stairs? Do not leave any items on the stairs. Make sure that there are handrails on both sides of the stairs and use them. Fix handrails that are broken or loose. Make sure that handrails are as long  as the stairways. Check any carpeting to make sure that it is firmly attached to the stairs. Fix any carpet that is loose or worn. Avoid having throw rugs at the top or bottom of the stairs. If you do have throw rugs, attach them to the floor with carpet tape. Make sure that you have a light switch at the top of the stairs and the bottom of the stairs. If you do not have them, ask someone to add them for you. What else can I do to help prevent falls? Wear shoes that: Do not have high heels. Have rubber bottoms. Are comfortable and fit you well. Are closed at the toe. Do not wear sandals. If you use a stepladder: Make sure that it is fully opened. Do not climb a closed stepladder. Make sure that both sides of the stepladder are locked into place. Ask someone to hold it for you, if possible. Clearly mark and make sure that you can see: Any grab bars or  handrails. First and last steps. Where the edge of each step is. Use tools that help you move around (mobility aids) if they are needed. These include: Canes. Walkers. Scooters. Crutches. Turn on the lights when you go into a dark area. Replace any light bulbs as soon as they burn out. Set up your furniture so you have a clear path. Avoid moving your furniture around. If any of your floors are uneven, fix them. If there are any pets around you, be aware of where they are. Review your medicines with your doctor. Some medicines can make you feel dizzy. This can increase your chance of falling. Ask your doctor what other things that you can do to help prevent falls. This information is not intended to replace advice given to you by your health care provider. Make sure you discuss any questions you have with your health care provider. Document Released: 08/10/2009 Document Revised: 03/21/2016 Document Reviewed: 11/18/2014 Elsevier Interactive Patient Education  2017 [employer].

## 2023-09-30 LAB — HEPATIC FUNCTION PANEL
ALT: 27 U/L (ref 7–35)
AST: 29 (ref 13–35)
Alkaline Phosphatase: 68 (ref 25–125)
Bilirubin, Direct: 0.4

## 2023-09-30 LAB — CBC AND DIFFERENTIAL
HCT: 49 — AB (ref 36–46)
Hemoglobin: 15.9 (ref 12.0–16.0)
Neutrophils Absolute: 4.5
Platelets: 150 10*3/uL (ref 150–400)
WBC: 8

## 2023-09-30 LAB — TSH: TSH: 1.03 (ref 0.41–5.90)

## 2023-09-30 LAB — BASIC METABOLIC PANEL
BUN: 13 (ref 4–21)
CO2: 24 — AB (ref 13–22)
Chloride: 99 (ref 99–108)
Creatinine: 0.8 (ref 0.5–1.1)
Glucose: 104
Potassium: 4.2 meq/L (ref 3.5–5.1)
Sodium: 138 (ref 137–147)

## 2023-09-30 LAB — CBC: RBC: 5.31 — AB (ref 3.87–5.11)

## 2023-09-30 LAB — LIPID PANEL
Cholesterol: 240 — AB (ref 0–200)
HDL: 65 (ref 35–70)
LDL Cholesterol: 145
Triglycerides: 170 — AB (ref 40–160)

## 2023-09-30 LAB — HEMOGLOBIN A1C: Hemoglobin A1C: 5.8

## 2023-09-30 LAB — COMPREHENSIVE METABOLIC PANEL
Albumin: 4.9 (ref 3.5–5.0)
Calcium: 10 (ref 8.7–10.7)
Globulin: 2.6
eGFR: 76

## 2023-10-15 ENCOUNTER — Encounter: Payer: Self-pay | Admitting: Internal Medicine

## 2023-11-12 ENCOUNTER — Telehealth: Payer: Self-pay | Admitting: Internal Medicine

## 2023-11-12 NOTE — Telephone Encounter (Signed)
Okay with me, thank you 

## 2023-11-12 NOTE — Telephone Encounter (Signed)
 Pt was scheduled for a NP appt with Minna Amass on 11/28/23 for a transfer of care from Dr.Paz that was scheduled by E2C2. Please advise if TOC is okay.

## 2023-11-28 ENCOUNTER — Other Ambulatory Visit (HOSPITAL_BASED_OUTPATIENT_CLINIC_OR_DEPARTMENT_OTHER): Payer: Self-pay | Admitting: Family Medicine

## 2023-11-28 ENCOUNTER — Ambulatory Visit (HOSPITAL_BASED_OUTPATIENT_CLINIC_OR_DEPARTMENT_OTHER)
Admission: RE | Admit: 2023-11-28 | Discharge: 2023-11-28 | Disposition: A | Discharge status: Home / Self Care | Payer: Medicare Other | Source: Ambulatory Visit | Attending: Family Medicine | Admitting: Family Medicine

## 2023-11-28 ENCOUNTER — Ambulatory Visit: Payer: Medicare Other | Admitting: Family Medicine

## 2023-11-28 VITALS — BP 136/78 | HR 86 | Ht 63.0 in | Wt 126.0 lb

## 2023-11-28 DIAGNOSIS — R739 Hyperglycemia, unspecified: Secondary | ICD-10-CM | POA: Diagnosis not present

## 2023-11-28 DIAGNOSIS — M25511 Pain in right shoulder: Secondary | ICD-10-CM | POA: Diagnosis present

## 2023-11-28 DIAGNOSIS — N951 Menopausal and female climacteric states: Secondary | ICD-10-CM

## 2023-11-28 DIAGNOSIS — E782 Mixed hyperlipidemia: Secondary | ICD-10-CM | POA: Diagnosis not present

## 2023-11-28 DIAGNOSIS — E039 Hypothyroidism, unspecified: Secondary | ICD-10-CM | POA: Diagnosis not present

## 2023-11-28 LAB — T3, FREE: Free T-3: 2.4

## 2023-11-28 LAB — FSH/LH
FSH: 52.2
LH: 28.6

## 2023-11-28 LAB — DHEA-SULFATE: DHEA-Sulfate, Serum: 137

## 2023-11-28 LAB — TESTOSTERONE: Testosterone: 423

## 2023-11-28 LAB — PROGESTERONE: Progesterone: 2.3

## 2023-11-28 LAB — CORTISOL: Cortisol: 11.4

## 2023-11-28 LAB — ESTROGENS, TOTAL: Estrogen, Total: 330

## 2023-11-28 LAB — TESTOSTERONE, FREE, DIRECT: Free Testosterone, Direct: 11.6

## 2023-11-28 LAB — SEX HORMONE BINDING GLOBULIN: Sex Horm Binding Glob, Serum: 32.1

## 2023-11-28 LAB — THYROXINE (T4) FREE, DIRECT: T4,Free (Direct): 1.14

## 2023-11-28 LAB — T3, REVERSE: T3, Reverse: 20.4

## 2023-11-28 LAB — ESTRADIOL: Estradiol: 66.7

## 2023-11-28 LAB — SEDIMENTATION RATE: SED RATE BY MODIFIED WESTERGREN,MANUAL: 2

## 2023-11-28 LAB — INSULIN, FREE: INSULIN: 8.1

## 2023-11-28 NOTE — Assessment & Plan Note (Signed)
On Synthroid. Asymptomatic Following with integrative medicine Labs done in December, stable

## 2023-11-28 NOTE — Assessment & Plan Note (Signed)
Post-hysterectomy patient on progesterone capsules and testosterone cream managed by Theodora Blow. Reports feeling well on current regimen.

## 2023-11-28 NOTE — Assessment & Plan Note (Signed)
Elevated cholesterol levels, managed with lifestyle modifications. No history of being on cholesterol medication. Lifestyle factors for lowering cholesterol include: Diet therapy - heart-healthy diet rich in fruits, veggies, fiber-rich whole grains, lean meats, chicken, fish (at least twice a week), fat-free or 1% dairy products; foods low in saturated/trans fats, cholesterol, sodium, and sugar. Mediterranean diet has shown to be very heart healthy. Regular exercise - recommend at least 30 minutes a day, 5 times per week Weight management

## 2023-11-28 NOTE — Assessment & Plan Note (Signed)
A1C of 5.8, down from 5.9 six months prior. Regular monitoring by Bernadette Hoit. -Continue current monitoring and lifestyle modifications.
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
## 2023-12-29 ENCOUNTER — Ambulatory Visit: Payer: Medicare Other | Attending: Family Medicine

## 2023-12-29 DIAGNOSIS — M542 Cervicalgia: Secondary | ICD-10-CM | POA: Diagnosis present

## 2023-12-29 DIAGNOSIS — M6281 Muscle weakness (generalized): Secondary | ICD-10-CM | POA: Insufficient documentation

## 2023-12-29 DIAGNOSIS — G8929 Other chronic pain: Secondary | ICD-10-CM | POA: Diagnosis present

## 2023-12-29 DIAGNOSIS — M25511 Pain in right shoulder: Secondary | ICD-10-CM | POA: Diagnosis present

## 2023-12-29 NOTE — Therapy (Signed)
 OUTPATIENT PHYSICAL THERAPY SHOULDER EVALUATION   Patient Name: Ann Bell MRN: [id number] DOB:[birth year], 78 y.o., female Today's Date: 12/29/2023  END OF SESSION:  PT End of Session - 12/29/23 1352     Visit Number 1    PT Start Time 1355    PT Stop Time 1440    PT Time Calculation (min) 45 min             Past Medical History:  Diagnosis Date   Anemia    Arthritis    Chronic back pain    spondylolisthesis   Glaucoma    Headache    History of blood transfusion    no abnormal reaction noted   History of colon polyps    benign   Hypothyroidism    takes NP Thyroid   Perforated ulcer (HCC)    Peripheral neuropathy    Pneumonia    hx of-yrs ago   Primary localized osteoarthritis of left knee 04/14/2018   Past Surgical History:  Procedure Laterality Date   ABDOMINAL HYSTERECTOMY  1973   still has one ovary   ANTERIOR LAT LUMBAR FUSION N/A 02/28/2017   Procedure: Lumbar four-five Transpsoas lateral interbody fusion with Lumbar four-five Percutaneous pedicle screw fixation, posterolateral arthrodesis with  Mazor ;  Surgeon: Ditty, Loura Halt, MD;  Location: [facility] OR;  Service: Neurosurgery;  Laterality: N/A;   APPENDECTOMY  1965   APPLICATION OF ROBOTIC ASSISTANCE FOR SPINAL PROCEDURE N/A 02/28/2017   Procedure: APPLICATION OF ROBOTIC ASSISTANCE FOR SPINAL PROCEDURE;  Surgeon: Ditty, Loura Halt, MD;  Location: [facility] OR;  Service: Neurosurgery;  Laterality: N/A;   cataract surgery Bilateral    CESAREAN SECTION     x2, 1968, 1972   COLON SURGERY  2018   hemi-colectomy resection   COLONOSCOPY WITH ESOPHAGOGASTRODUODENOSCOPY (EGD)     EPIDURAL BLOCK INJECTION     several times   ganglion cyst removed from throat     JOINT REPLACEMENT     LUMBAR PERCUTANEOUS PEDICLE SCREW 1 LEVEL N/A 02/28/2017   Procedure: LUMBAR PERCUTANEOUS PEDICLE SCREW LUMBAR FOUR-FIVE;  Surgeon: Ditty, Loura Halt, MD;  Location: [facility] OR;  Service: Neurosurgery;  Laterality: N/A;   OVARIAN CYST  REMOVAL  1965   PARTIAL KNEE ARTHROPLASTY Left 04/14/2018   Procedure: UNICOMPARTMENTAL KNEE;  Surgeon: Teryl Lucy, MD;  Location: [facility] OR;  Service: Orthopedics;  Laterality: Left;   REPLACEMENT UNICONDYLAR JOINT KNEE Left 04/14/2018   TONSILLECTOMY AND ADENOIDECTOMY  1950   Patient Active Problem List   Diagnosis Date Noted   Mixed hyperlipidemia 11/28/2023   Cervical spondylosis 09/13/2019   Annual physical exam 08/16/2019   PCP notes >>>>>>>>>>>>>> 05/14/2018   Hyperglycemia 05/14/2018   Menopausal state 05/14/2018   Primary localized osteoarthritis of left knee 04/14/2018   S/P knee replacement 04/14/2018   Duodenal ulcer 04/10/2018   Glaucoma 03/30/2018   GERD (gastroesophageal reflux disease) 03/30/2018   S/P right colectomy 04/23/2017   Intractable headache 03/10/2017   Gait instability 03/10/2017   Generalized weakness 03/10/2017   Leukocytosis 03/10/2017   Hypothyroidism 03/10/2017   Opioid dependence (HCC) 03/10/2017   Spondylolisthesis of lumbosacral region 02/28/2017   S/P lumbar fusion 02/25/2017    PCP: Hyman Hopes  REFERRING PROVIDER: Hyman Hopes  REFERRING DIAG: Rotator cuff injury  THERAPY DIAG:  Chronic right shoulder pain  Cervicalgia  Muscle weakness (generalized)  Rationale for Evaluation and Treatment: Rehabilitation  ONSET DATE: 12/03/23  SUBJECTIVE:  SUBJECTIVE STATEMENT: I have more or less a recurrent kind of situation. It started years ago. I started exercises for 2-3 years (a class) as it worked on the shoulder it got worse. It has been painful for about 2 months. Has noted some popping with lifting and externally rotating.   Hand dominance: Right  PERTINENT HISTORY: Hx of back surgery 2018 Partial knee 2019  PAIN:  Are you having pain? Yes: NPRS scale:  5/10 Pain location: neck and R shoulder  Pain description: can be both sharp/shooting and achy/dull Aggravating factors: activity, lifting, overhead lifting  Relieving factors: homeopathic and rest   PRECAUTIONS: None  RED FLAGS: None   WEIGHT BEARING RESTRICTIONS: No  FALLS:  Has patient fallen in last 6 months? No  LIVING ENVIRONMENT: Lives with: lives with their spouse Lives in: House/apartment Stairs: Yes: Internal: 15 steps; on right going up  PLOF: Independent  PATIENT GOALS:to stop this from interfering with what I want to do, because I want to stay as fit as I possibly can   NEXT MD VISIT:   OBJECTIVE:  Note: Objective measures were completed at Evaluation unless otherwise noted.  DIAGNOSTIC FINDINGS:  FINDINGS: There is no evidence of fracture or dislocation. Mildacromioclavicular osteoarthritis is noted. The glenohumeral joint appears normal. Soft tissues are unremarkable.   IMPRESSION: Mild acromioclavicular osteoarthritis.  Otherwise negative.  PATIENT SURVEYS:  Quick Dash 27.3  COGNITION: Overall cognitive status: Within functional limits for tasks assessed     SENSATION: WFL  POSTURE: No significant postural limitations   UPPER EXTREMITY ROM:   Active ROM Right eval Left eval  Shoulder flexion 140 "tight"   Shoulder extension    Shoulder abduction WNL   Shoulder adduction    Shoulder internal rotation WFL   Shoulder external rotation WFL "a little tight"    Elbow flexion    Elbow extension    Wrist flexion    Wrist extension    Wrist ulnar deviation    Wrist radial deviation    Wrist pronation    Wrist supination    (Blank rows = not tested)  UPPER EXTREMITY MMT:  MMT Right eval Left eval  Shoulder flexion 5/5   Shoulder extension    Shoulder abduction 4/5   Shoulder adduction    Shoulder internal rotation 4/5   Shoulder external rotation 4/5   Middle trapezius    Lower trapezius    Elbow flexion    Elbow extension     Wrist flexion    Wrist extension    Wrist ulnar deviation    Wrist radial deviation    Wrist pronation    Wrist supination    Grip strength (lbs)    (Blank rows = not tested)  SHOULDER SPECIAL TESTS: Impingement tests: Neer impingement test: negative, Hawkins/Kennedy impingement test: negative, and Painful arc test: negative Rotator cuff assessment: Drop arm test: negative, Empty can test: negative, and Full can test: negative  JOINT MOBILITY TESTING:  Able to get full PROM, only some tightness at end range flexion   PALPATION:  Some tightness in R UT  TREATMENT DATE: 12/29/23- EVAL   PATIENT EDUCATION: Education details: POC, exercises to avoid in her class  Person educated: Patient Education method: Explanation Education comprehension: verbalized understanding  HOME EXERCISE PROGRAM: TBD  ASSESSMENT:  CLINICAL IMPRESSION: Patient is a 78 y.o. female who was seen today for physical therapy evaluation and treatment for R shoulder pain and neck pain. Her pain has been ongoing for years and comes and goes depending on the activity. Some days she is able to complete all her ADLs but some days she has pain. Today she presents with good overall strength and ROM, she only had some slight pain and tightness into shoulder flexion. Most of the time she feels pain it is in the posterior capsule and into her triceps. Pt reports she is unable to do some of the exercises in her weight lifting class at the [facility], such as the barbell OHP so she stopped doing this for now. She will benefit from skilled PT to address her neck and shoulder pain to be able to do her daily chores and continue working out without increases in pain.   OBJECTIVE IMPAIRMENTS: impaired UE functional use, improper body mechanics, and pain.   ACTIVITY LIMITATIONS: reach over head and  hygiene/grooming  REHAB POTENTIAL: Excellent  CLINICAL DECISION MAKING: Stable/uncomplicated  EVALUATION COMPLEXITY: Low  GOALS: Goals reviewed with patient? Yes  SHORT TERM GOALS: Target date: 01/26/24  Patient will be independent with initial HEP.  Baseline: TBD Goal status: INITIAL   LONG TERM GOALS: Target date: 02/23/24  Patient will be independent with advanced/ongoing HEP to improve outcomes and carryover.  Baseline:  Goal status: INITIAL  2.  Patient will report 75% improvement in R shoulder and neck pain to improve QOL.  Baseline: 5/10 on average Goal status: INITIAL  3.  Patient will be able to participate in weight training class without pain provocation  Baseline: unable to do all the exercises d/t pain esp with barbell OHP  Goal status: INITIAL  4.  Patient will report 10 points improvement on QuickDash to demonstrate improved functional ability.  Baseline: 27.3/100 oal status: INITIAL  PLAN:  PT FREQUENCY: 1x/week  PT DURATION: 8 weeks  PLANNED INTERVENTIONS: 97110-Therapeutic exercises, 97530- Therapeutic activity, [id number]- Neuromuscular re-education, 97535- Self Care, 16109- Manual therapy, [id number]- Electrical stimulation (unattended), 97035- Ultrasound, [id number]- Ionotophoresis 4mg /ml Dexamethasone, Patient/Family education, Taping, Dry Needling, Joint mobilization, Spinal mobilization, Cryotherapy, and Moist heat  PLAN FOR NEXT SESSION: manual therapy for neck. Shoulder strengthening in available ranges without pain. Wants to talk about DN    Cassie Freer, PT 12/29/2023, 2:44 PM

## 2024-01-07 ENCOUNTER — Ambulatory Visit

## 2024-01-07 ENCOUNTER — Other Ambulatory Visit: Payer: Self-pay

## 2024-01-07 DIAGNOSIS — M6281 Muscle weakness (generalized): Secondary | ICD-10-CM

## 2024-01-07 DIAGNOSIS — M542 Cervicalgia: Secondary | ICD-10-CM

## 2024-01-07 DIAGNOSIS — M25511 Pain in right shoulder: Secondary | ICD-10-CM | POA: Diagnosis not present

## 2024-01-07 DIAGNOSIS — G8929 Other chronic pain: Secondary | ICD-10-CM

## 2024-01-07 NOTE — Therapy (Signed)
 OUTPATIENT PHYSICAL THERAPY SHOULDER EVALUATION   Patient Name: Ann Bell MRN: [id number] DOB:01-02-1946, 78 y.o., female Today's Date: 01/07/2024  END OF SESSION:  PT End of Session - 01/07/24 1419     Visit Number 2    Date for PT Re-Evaluation 02/23/24    Progress Note Due on Visit 10    PT Start Time 1345    PT Stop Time 1430    PT Time Calculation (min) 45 min    Activity Tolerance Patient tolerated treatment well    Behavior During Therapy [facility] for tasks assessed/performed              Past Medical History:  Diagnosis Date   Anemia    Arthritis    Chronic back pain    spondylolisthesis   Glaucoma    Headache    History of blood transfusion    no abnormal reaction noted   History of colon polyps    benign   Hypothyroidism    takes NP Thyroid   Perforated ulcer (HCC)    Peripheral neuropathy    Pneumonia    hx of-yrs ago   Primary localized osteoarthritis of left knee 04/14/2018   Past Surgical History:  Procedure Laterality Date   ABDOMINAL HYSTERECTOMY  1973   still has one ovary   ANTERIOR LAT LUMBAR FUSION N/A 02/28/2017   Procedure: Lumbar four-five Transpsoas lateral interbody fusion with Lumbar four-five Percutaneous pedicle screw fixation, posterolateral arthrodesis with  Mazor ;  Surgeon: Ditty, Loura Halt, MD;  Location: [facility] OR;  Service: Neurosurgery;  Laterality: N/A;   APPENDECTOMY  1965   APPLICATION OF ROBOTIC ASSISTANCE FOR SPINAL PROCEDURE N/A 02/28/2017   Procedure: APPLICATION OF ROBOTIC ASSISTANCE FOR SPINAL PROCEDURE;  Surgeon: Ditty, Loura Halt, MD;  Location: [facility] OR;  Service: Neurosurgery;  Laterality: N/A;   cataract surgery Bilateral    CESAREAN SECTION     x2, 1968, 1972   COLON SURGERY  2018   hemi-colectomy resection   COLONOSCOPY WITH ESOPHAGOGASTRODUODENOSCOPY (EGD)     EPIDURAL BLOCK INJECTION     several times   ganglion cyst removed from throat     JOINT REPLACEMENT     LUMBAR PERCUTANEOUS PEDICLE SCREW 1 LEVEL  N/A 02/28/2017   Procedure: LUMBAR PERCUTANEOUS PEDICLE SCREW LUMBAR FOUR-FIVE;  Surgeon: Ditty, Loura Halt, MD;  Location: [facility] OR;  Service: Neurosurgery;  Laterality: N/A;   OVARIAN CYST REMOVAL  1965   PARTIAL KNEE ARTHROPLASTY Left 04/14/2018   Procedure: UNICOMPARTMENTAL KNEE;  Surgeon: Teryl Lucy, MD;  Location: [facility] OR;  Service: Orthopedics;  Laterality: Left;   REPLACEMENT UNICONDYLAR JOINT KNEE Left 04/14/2018   TONSILLECTOMY AND ADENOIDECTOMY  1950   Patient Active Problem List   Diagnosis Date Noted   Mixed hyperlipidemia 11/28/2023   Cervical spondylosis 09/13/2019   Annual physical exam 08/16/2019   PCP notes >>>>>>>>>>>>>> 05/14/2018   Hyperglycemia 05/14/2018   Menopausal state 05/14/2018   Primary localized osteoarthritis of left knee 04/14/2018   S/P knee replacement 04/14/2018   Duodenal ulcer 04/10/2018   Glaucoma 03/30/2018   GERD (gastroesophageal reflux disease) 03/30/2018   S/P right colectomy 04/23/2017   Intractable headache 03/10/2017   Gait instability 03/10/2017   Generalized weakness 03/10/2017   Leukocytosis 03/10/2017   Hypothyroidism 03/10/2017   Opioid dependence (HCC) 03/10/2017   Spondylolisthesis of lumbosacral region 02/28/2017   S/P lumbar fusion 02/25/2017    PCP: Hyman Hopes  REFERRING PROVIDER: Hyman Hopes  REFERRING DIAG: Rotator cuff injury  THERAPY DIAG:  Cervicalgia  Chronic right shoulder pain  Muscle weakness (generalized)  Rationale for Evaluation and Treatment: Rehabilitation  ONSET DATE: 12/03/23  SUBJECTIVE:                                                                                                                                                                                      SUBJECTIVE STATEMENT:01/07/24:  still sore/ painful R shoulder, upper traps.  Eager to improve EVAL: I have more or less a recurrent kind of situation. It started years ago. I started exercises for 2-3 years (a class) as it worked on  the shoulder it got worse. It has been painful for about 2 months. Has noted some popping with lifting and externally rotating.   Hand dominance: Right  PERTINENT HISTORY: Hx of back surgery 2018 Partial knee 2019  PAIN:  Are you having pain? Yes: NPRS scale: 5/10 Pain location: neck and R shoulder  Pain description: can be both sharp/shooting and achy/dull Aggravating factors: activity, lifting, overhead lifting  Relieving factors: homeopathic and rest   PRECAUTIONS: None  RED FLAGS: None   WEIGHT BEARING RESTRICTIONS: No  FALLS:  Has patient fallen in last 6 months? No  LIVING ENVIRONMENT: Lives with: lives with their spouse Lives in: House/apartment Stairs: Yes: Internal: 15 steps; on right going up  PLOF: Independent  PATIENT GOALS:to stop this from interfering with what I want to do, because I want to stay as fit as I possibly can   NEXT MD VISIT:   OBJECTIVE:  Note: Objective measures were completed at Evaluation unless otherwise noted.  DIAGNOSTIC FINDINGS:  FINDINGS: There is no evidence of fracture or dislocation. Mildacromioclavicular osteoarthritis is noted. The glenohumeral joint appears normal. Soft tissues are unremarkable.   IMPRESSION: Mild acromioclavicular osteoarthritis.  Otherwise negative.  PATIENT SURVEYS:  Quick Dash 27.3  COGNITION: Overall cognitive status: Within functional limits for tasks assessed     SENSATION: WFL  POSTURE: No significant postural limitations   UPPER EXTREMITY ROM:   Active ROM Right eval Left eval  Shoulder flexion 140 "tight"   Shoulder extension    Shoulder abduction WNL   Shoulder adduction    Shoulder internal rotation WFL   Shoulder external rotation WFL "a little tight"    Elbow flexion    Elbow extension    Wrist flexion    Wrist extension    Wrist ulnar deviation    Wrist radial deviation    Wrist pronation    Wrist supination    (Blank rows = not tested)  UPPER EXTREMITY MMT:  MMT  Right eval Left eval  Shoulder flexion 5/5   Shoulder extension    Shoulder  abduction 4/5   Shoulder adduction    Shoulder internal rotation 4/5   Shoulder external rotation 4/5   Middle trapezius    Lower trapezius    Elbow flexion    Elbow extension    Wrist flexion    Wrist extension    Wrist ulnar deviation    Wrist radial deviation    Wrist pronation    Wrist supination    Grip strength (lbs)    (Blank rows = not tested)  SHOULDER SPECIAL TESTS: Impingement tests: Neer impingement test: negative, Hawkins/Kennedy impingement test: negative, and Painful arc test: negative Rotator cuff assessment: Drop arm test: negative, Empty can test: negative, and Full can test: negative  JOINT MOBILITY TESTING:  Able to get full PROM, only some tightness at end range flexion   PALPATION:  Some tightness in R UT                                                                                                                              TREATMENT DATE:  01/07/24:  Manual: supine for stretching R shoulder with lateral humeral glides, combined with R shoulder flexion.   Prone for gentle PA glides middle and upper ribs  Cross friction massage B upper traps, middle traps Trigger Point Dry Needling  Initial Treatment: Pt instructed on Dry Needling rational, procedures, and possible side effects. Pt instructed to expect mild to moderate muscle soreness later in the day and/or into the next day.  Pt instructed in methods to reduce muscle soreness. Pt instructed to continue prescribed HEP. Because Dry Needling was performed over or adjacent to a lung field, pt was educated on S/S of pneumothorax and to seek immediate medical attention should they occur.  Patient was educated on signs and symptoms of infection and other risk factors and advised to seek medical attention should they occur.  Patient verbalized understanding of these instructions and education.   Patient Verbal Consent Given:  Yes Education Handout Provided: Yes Muscles Treated: B upper traps 2 pts each Electrical Stimulation Performed: No Treatment Response/Outcome: twitch response B  Neuromuscular re education: Prone for MMT B middle traps, pain and notable loss of R scapular retraction movement on R as compare to L Instructed in prone rows with terminal ER, to isolate middle traps, attempted R shoulder ext and R shoulder horizontal abd with ER, these ex did not effectively engage scapular musculature.so instructed to perform the prone rows with ER , 15 x daily at home Inst in child's pose with thread the needle to achieve posterior shoulder jt stretch, prior to her ex sessions at [facility] in pendulum R shoulder, trial with 4# wt, to utilize as well just prior to ex sessions.       12/29/23- EVAL   PATIENT EDUCATION: Education details: POC, exercises to avoid in her class  Person educated: Patient Education method: Explanation Education comprehension: verbalized understanding  HOME EXERCISE PROGRAM: Access Code: [email or web address] URL: [email or web address] Date:  01/07/2024 Prepared by: Caralee Ates  Exercises - Prone Shoulder External Rotation  - 1 x daily - 7 x weekly - 3 sets - 10 reps - Circular Shoulder Pendulum with Table Support  - 1 x daily - 7 x weekly - 1 sets - 10 reps TBD  ASSESSMENT:  CLINICAL IMPRESSION: Patient is a 78 y.o. female who participated in physical therapy treatment for R shoulder pain and neck pain. We introduced manual techniques including myofascial release and light PA glides mid and upper B ribs and TPDN.  Also instructed in therex specific to improve her motor control and timing with R scapulohumeral rhythm.   She tolerated well. Taught her some other stretches, techniques to improve her R shoulder mobility when participating in her exercise classes as well.  She will benefit from skilled PT to address her neck and shoulder pain to be able to do her daily chores and  continue working out without increases in pain.   OBJECTIVE IMPAIRMENTS: impaired UE functional use, improper body mechanics, and pain.   ACTIVITY LIMITATIONS: reach over head and hygiene/grooming  REHAB POTENTIAL: Excellent  CLINICAL DECISION MAKING: Stable/uncomplicated  EVALUATION COMPLEXITY: Low  GOALS: Goals reviewed with patient? Yes  SHORT TERM GOALS: Target date: 01/26/24  Patient will be independent with initial HEP.  Baseline: TBD Goal status: INITIAL   LONG TERM GOALS: Target date: 02/23/24  Patient will be independent with advanced/ongoing HEP to improve outcomes and carryover.  Baseline:  Goal status: INITIAL  2.  Patient will report 75% improvement in R shoulder and neck pain to improve QOL.  Baseline: 5/10 on average Goal status: INITIAL  3.  Patient will be able to participate in weight training class without pain provocation  Baseline: unable to do all the exercises d/t pain esp with barbell OHP  Goal status: INITIAL  4.  Patient will report 10 points improvement on QuickDash to demonstrate improved functional ability.  Baseline: 27.3/100 oal status: INITIAL  PLAN:  PT FREQUENCY: 1x/week  PT DURATION: 8 weeks  PLANNED INTERVENTIONS: 97110-Therapeutic exercises, 97530- Therapeutic activity, [id number]- Neuromuscular re-education, 97535- Self Care, [id number]- Manual therapy, [id number]- Electrical stimulation (unattended), 97035- Ultrasound, [id number]- Ionotophoresis 4mg /ml Dexamethasone, Patient/Family education, Taping, Dry Needling, Joint mobilization, Spinal mobilization, Cryotherapy, and Moist heat  PLAN FOR NEXT SESSION: manual therapy for neck. Shoulder strengthening in available ranges without pain. How was dry needling, how is neck, reassess shoulder strength and mechanics   Hayzel Ruberg L Zaccheaus Storlie, PT, DPT, OCS 01/07/2024, 4:07 PM +

## 2024-01-08 ENCOUNTER — Telehealth: Payer: Self-pay | Admitting: Family Medicine

## 2024-01-08 NOTE — Telephone Encounter (Signed)
 Copied from CRM [id number]. Topic: Medicare AWV >> Jan 08, 2024  1:24 PM Payton Doughty wrote: Reason for CRM: Called LVM 01/08/2024 to schedule AWV. Please schedule office or virtual visits  Verlee Rossetti; Care Guide Ambulatory Clinical Support  l [facility] Direct Dial: [phone]

## 2024-01-14 ENCOUNTER — Ambulatory Visit

## 2024-01-14 ENCOUNTER — Other Ambulatory Visit: Payer: Self-pay

## 2024-01-14 DIAGNOSIS — M542 Cervicalgia: Secondary | ICD-10-CM

## 2024-01-14 DIAGNOSIS — G8929 Other chronic pain: Secondary | ICD-10-CM

## 2024-01-14 DIAGNOSIS — M6281 Muscle weakness (generalized): Secondary | ICD-10-CM

## 2024-01-14 DIAGNOSIS — M25511 Pain in right shoulder: Secondary | ICD-10-CM | POA: Diagnosis not present

## 2024-01-14 NOTE — Therapy (Signed)
 OUTPATIENT PHYSICAL THERAPY SHOULDER EVALUATION   Patient Name: Ann Bell MRN: [id number] DOB:05-Jul-1946, 78 y.o., female Today's Date: 01/14/2024  END OF SESSION:  PT End of Session - 01/14/24 1449     Visit Number 3    Date for PT Re-Evaluation 02/23/24    Progress Note Due on Visit 10    PT Start Time 1345    PT Stop Time 1430    PT Time Calculation (min) 45 min    Activity Tolerance Patient tolerated treatment well    Behavior During Therapy [facility] for tasks assessed/performed               Past Medical History:  Diagnosis Date   Anemia    Arthritis    Chronic back pain    spondylolisthesis   Glaucoma    Headache    History of blood transfusion    no abnormal reaction noted   History of colon polyps    benign   Hypothyroidism    takes NP Thyroid   Perforated ulcer (HCC)    Peripheral neuropathy    Pneumonia    hx of-yrs ago   Primary localized osteoarthritis of left knee 04/14/2018   Past Surgical History:  Procedure Laterality Date   ABDOMINAL HYSTERECTOMY  1973   still has one ovary   ANTERIOR LAT LUMBAR FUSION N/A 02/28/2017   Procedure: Lumbar four-five Transpsoas lateral interbody fusion with Lumbar four-five Percutaneous pedicle screw fixation, posterolateral arthrodesis with  Mazor ;  Surgeon: Ditty, Loura Halt, MD;  Location: [facility] OR;  Service: Neurosurgery;  Laterality: N/A;   APPENDECTOMY  1965   APPLICATION OF ROBOTIC ASSISTANCE FOR SPINAL PROCEDURE N/A 02/28/2017   Procedure: APPLICATION OF ROBOTIC ASSISTANCE FOR SPINAL PROCEDURE;  Surgeon: Ditty, Loura Halt, MD;  Location: [facility] OR;  Service: Neurosurgery;  Laterality: N/A;   cataract surgery Bilateral    CESAREAN SECTION     x2, 1968, 1972   COLON SURGERY  2018   hemi-colectomy resection   COLONOSCOPY WITH ESOPHAGOGASTRODUODENOSCOPY (EGD)     EPIDURAL BLOCK INJECTION     several times   ganglion cyst removed from throat     JOINT REPLACEMENT     LUMBAR PERCUTANEOUS PEDICLE SCREW 1  LEVEL N/A 02/28/2017   Procedure: LUMBAR PERCUTANEOUS PEDICLE SCREW LUMBAR FOUR-FIVE;  Surgeon: Ditty, Loura Halt, MD;  Location: [facility] OR;  Service: Neurosurgery;  Laterality: N/A;   OVARIAN CYST REMOVAL  1965   PARTIAL KNEE ARTHROPLASTY Left 04/14/2018   Procedure: UNICOMPARTMENTAL KNEE;  Surgeon: Teryl Lucy, MD;  Location: [facility] OR;  Service: Orthopedics;  Laterality: Left;   REPLACEMENT UNICONDYLAR JOINT KNEE Left 04/14/2018   TONSILLECTOMY AND ADENOIDECTOMY  1950   Patient Active Problem List   Diagnosis Date Noted   Mixed hyperlipidemia 11/28/2023   Cervical spondylosis 09/13/2019   Annual physical exam 08/16/2019   PCP notes >>>>>>>>>>>>>> 05/14/2018   Hyperglycemia 05/14/2018   Menopausal state 05/14/2018   Primary localized osteoarthritis of left knee 04/14/2018   S/P knee replacement 04/14/2018   Duodenal ulcer 04/10/2018   Glaucoma 03/30/2018   GERD (gastroesophageal reflux disease) 03/30/2018   S/P right colectomy 04/23/2017   Intractable headache 03/10/2017   Gait instability 03/10/2017   Generalized weakness 03/10/2017   Leukocytosis 03/10/2017   Hypothyroidism 03/10/2017   Opioid dependence (HCC) 03/10/2017   Spondylolisthesis of lumbosacral region 02/28/2017   S/P lumbar fusion 02/25/2017    PCP: Hyman Hopes  REFERRING PROVIDER: Hyman Hopes  REFERRING DIAG: Rotator cuff injury  THERAPY  DIAG:  Cervicalgia  Chronic right shoulder pain  Muscle weakness (generalized)  Rationale for Evaluation and Treatment: Rehabilitation  ONSET DATE: 12/03/23  SUBJECTIVE:                                                                                                                                                                                      SUBJECTIVE STATEMENT:01/07/24:  still sore/ painful R shoulder, upper traps.  Eager to improve EVAL: I have more or less a recurrent kind of situation. It started years ago. I started exercises for 2-3 years (a class) as it  worked on the shoulder it got worse. It has been painful for about 2 months. Has noted some popping with lifting and externally rotating.   Hand dominance: Right  PERTINENT HISTORY: Hx of back surgery 2018 Partial knee 2019  PAIN:  Are you having pain? Yes: NPRS scale: 5/10 Pain location: neck and R shoulder  Pain description: can be both sharp/shooting and achy/dull Aggravating factors: activity, lifting, overhead lifting  Relieving factors: homeopathic and rest   PRECAUTIONS: None  RED FLAGS: None   WEIGHT BEARING RESTRICTIONS: No  FALLS:  Has patient fallen in last 6 months? No  LIVING ENVIRONMENT: Lives with: lives with their spouse Lives in: House/apartment Stairs: Yes: Internal: 15 steps; on right going up  PLOF: Independent  PATIENT GOALS:to stop this from interfering with what I want to do, because I want to stay as fit as I possibly can   NEXT MD VISIT:   OBJECTIVE:  Note: Objective measures were completed at Evaluation unless otherwise noted.  DIAGNOSTIC FINDINGS:  FINDINGS: There is no evidence of fracture or dislocation. Mildacromioclavicular osteoarthritis is noted. The glenohumeral joint appears normal. Soft tissues are unremarkable.   IMPRESSION: Mild acromioclavicular osteoarthritis.  Otherwise negative.  PATIENT SURVEYS:  Quick Dash 27.3  COGNITION: Overall cognitive status: Within functional limits for tasks assessed     SENSATION: WFL  POSTURE: No significant postural limitations   UPPER EXTREMITY ROM:   Active ROM Right eval Left eval  Shoulder flexion 140 "tight"   Shoulder extension    Shoulder abduction WNL   Shoulder adduction    Shoulder internal rotation WFL   Shoulder external rotation WFL "a little tight"    Elbow flexion    Elbow extension    Wrist flexion    Wrist extension    Wrist ulnar deviation    Wrist radial deviation    Wrist pronation    Wrist supination    (Blank rows = not tested)  UPPER EXTREMITY  MMT:  MMT Right eval Left eval  Shoulder flexion 5/5   Shoulder extension  Shoulder abduction 4/5   Shoulder adduction    Shoulder internal rotation 4/5   Shoulder external rotation 4/5   Middle trapezius    Lower trapezius    Elbow flexion    Elbow extension    Wrist flexion    Wrist extension    Wrist ulnar deviation    Wrist radial deviation    Wrist pronation    Wrist supination    Grip strength (lbs)    (Blank rows = not tested)  SHOULDER SPECIAL TESTS: Impingement tests: Neer impingement test: negative, Hawkins/Kennedy impingement test: negative, and Painful arc test: negative Rotator cuff assessment: Drop arm test: negative, Empty can test: negative, and Full can test: negative  JOINT MOBILITY TESTING:  Able to get full PROM, only some tightness at end range flexion   PALPATION:  Some tightness in R UT                                                                                                                              TREATMENT DATE:  01/14/24: Manual:  Supine for manual stretching , upper traps, levator scapulae, with manual depression of scapula, interspersed with contract/relax technique Supine upper cervical spine flexion with R rotation, contract/relax to improve ROM Prone for gentle PA pressure B upper ribs, mid and upper thoracic spine  Therapeutic activities: Reviewed and practiced prone rows with shoulder ER, to engage middle traps, utilized R shoulder and L shoulder, 22 reps R, 15 L Instructed in seated levator scapula stretch, with light isometric contract/relax, to relieve tension lower neck, B shoulders.   01/07/24:  Manual: supine for stretching R shoulder with lateral humeral glides, combined with R shoulder flexion.   Prone for gentle PA glides middle and upper ribs  Cross friction massage B upper traps, middle traps Trigger Point Dry Needling  Initial Treatment: Pt instructed on Dry Needling rational, procedures, and possible side  effects. Pt instructed to expect mild to moderate muscle soreness later in the day and/or into the next day.  Pt instructed in methods to reduce muscle soreness. Pt instructed to continue prescribed HEP. Because Dry Needling was performed over or adjacent to a lung field, pt was educated on S/S of pneumothorax and to seek immediate medical attention should they occur.  Patient was educated on signs and symptoms of infection and other risk factors and advised to seek medical attention should they occur.  Patient verbalized understanding of these instructions and education.   Patient Verbal Consent Given: Yes Education Handout Provided: Yes Muscles Treated: B upper traps 2 pts each Electrical Stimulation Performed: No Treatment Response/Outcome: twitch response B  Neuromuscular re education: Prone for MMT B middle traps, pain and notable loss of R scapular retraction movement on R as compare to L Instructed in prone rows with terminal ER, to isolate middle traps, attempted R shoulder ext and R shoulder horizontal abd with ER, these ex did not effectively engage scapular musculature.so instructed to perform  the prone rows with ER , 15 x daily at home Inst in child's pose with thread the needle to achieve posterior shoulder jt stretch, prior to her ex sessions at [facility] in pendulum R shoulder, trial with 4# wt, to utilize as well just prior to ex sessions.       12/29/23- EVAL   PATIENT EDUCATION: Education details: POC, exercises to avoid in her class  Person educated: Patient Education method: Explanation Education comprehension: verbalized understanding  HOME EXERCISE PROGRAM: Access Code: [email or web address] URL: [email or web address] Date: 01/14/2024 Prepared by: Caralee Ates  Exercises - Prone Shoulder Row with External Rotation with Dumbbell  - 1 x daily - 7 x weekly - 3 sets - 10 reps - Seated Levator Scapulae Stretch  - 1 x daily - 7 x weekly - 3 sets - 10 reps Access  Code: [email or web address] URL: [email or web address] Date: 01/07/2024 Prepared by: Hale Chalfin  Exercises - Prone Shoulder External Rotation  - 1 x daily - 7 x weekly - 3 sets - 10 reps - Circular Shoulder Pendulum with Table Support  - 1 x daily - 7 x weekly - 1 sets - 10 reps TBD  ASSESSMENT:  CLINICAL IMPRESSION: Patient is a 78 y.o. female who participated in physical therapy treatment today  for R shoulder pain and neck pain. Today utilized more specific dynamic stretching and contract /relax techniques for B lower neck/ shoulder musculature.  She tolerated very well and her upper traps pain/ tightness with palpation improved with the manual techniques so did not perform TPDN again today.   Noted improved R shoulder flexion motion as compared to initial evaluation, well over 140.  She will benefit from skilled PT to address her neck and shoulder pain to be able to do her daily chores and continue working out without increases in pain.   OBJECTIVE IMPAIRMENTS: impaired UE functional use, improper body mechanics, and pain.   ACTIVITY LIMITATIONS: reach over head and hygiene/grooming  REHAB POTENTIAL: Excellent  CLINICAL DECISION MAKING: Stable/uncomplicated  EVALUATION COMPLEXITY: Low  GOALS: Goals reviewed with patient? Yes  SHORT TERM GOALS: Target date: 01/26/24  Patient will be independent with initial HEP.  Baseline: TBD Goal status: INITIAL   LONG TERM GOALS: Target date: 02/23/24  Patient will be independent with advanced/ongoing HEP to improve outcomes and carryover.  Baseline:  Goal status: INITIAL  2.  Patient will report 75% improvement in R shoulder and neck pain to improve QOL.  Baseline: 5/10 on average Goal status: INITIAL  3.  Patient will be able to participate in weight training class without pain provocation  Baseline: unable to do all the exercises d/t pain esp with barbell OHP  Goal status: INITIAL  4.  Patient will report 10 points improvement  on QuickDash to demonstrate improved functional ability.  Baseline: 27.3/100 oal status: INITIAL  PLAN:  PT FREQUENCY: 1x/week  PT DURATION: 8 weeks  PLANNED INTERVENTIONS: 97110-Therapeutic exercises, 97530- Therapeutic activity, [id number]- Neuromuscular re-education, 97535- Self Care, 95621- Manual therapy, [id number]- Electrical stimulation (unattended), 97035- Ultrasound, [id number]- Ionotophoresis 4mg /ml Dexamethasone, Patient/Family education, Taping, Dry Needling, Joint mobilization, Spinal mobilization, Cryotherapy, and Moist heat  PLAN FOR NEXT SESSION: manual therapy for neck. Shoulder strengthening in available ranges without pain. ? dry needling, how is neck, reassess shoulder strength and mechanics   Journe Hallmark L Adelee Hannula, PT, DPT, OCS 01/14/2024, 2:54 PM +

## 2024-01-21 ENCOUNTER — Ambulatory Visit

## 2024-01-21 ENCOUNTER — Other Ambulatory Visit: Payer: Self-pay

## 2024-01-21 DIAGNOSIS — M542 Cervicalgia: Secondary | ICD-10-CM

## 2024-01-21 DIAGNOSIS — M25511 Pain in right shoulder: Secondary | ICD-10-CM | POA: Diagnosis not present

## 2024-01-21 DIAGNOSIS — M6281 Muscle weakness (generalized): Secondary | ICD-10-CM

## 2024-01-21 DIAGNOSIS — G8929 Other chronic pain: Secondary | ICD-10-CM

## 2024-01-21 NOTE — Therapy (Signed)
 OUTPATIENT PHYSICAL THERAPY SHOULDER TREATMENT   Patient Name: Ann Bell MRN: [id number] DOB:05/23/1946, 78 y.o., female Today's Date: 01/21/2024  END OF SESSION:  PT End of Session - 01/21/24 1703     Visit Number 4    Date for PT Re-Evaluation 02/23/24    Progress Note Due on Visit 10    PT Start Time 1345    PT Stop Time 1430    PT Time Calculation (min) 45 min    Activity Tolerance Patient tolerated treatment well    Behavior During Therapy [facility] for tasks assessed/performed                Past Medical History:  Diagnosis Date   Anemia    Arthritis    Chronic back pain    spondylolisthesis   Glaucoma    Headache    History of blood transfusion    no abnormal reaction noted   History of colon polyps    benign   Hypothyroidism    takes NP Thyroid   Perforated ulcer (HCC)    Peripheral neuropathy    Pneumonia    hx of-yrs ago   Primary localized osteoarthritis of left knee 04/14/2018   Past Surgical History:  Procedure Laterality Date   ABDOMINAL HYSTERECTOMY  1973   still has one ovary   ANTERIOR LAT LUMBAR FUSION N/A 02/28/2017   Procedure: Lumbar four-five Transpsoas lateral interbody fusion with Lumbar four-five Percutaneous pedicle screw fixation, posterolateral arthrodesis with  Mazor ;  Surgeon: Ditty, Loura Halt, MD;  Location: [facility] OR;  Service: Neurosurgery;  Laterality: N/A;   APPENDECTOMY  1965   APPLICATION OF ROBOTIC ASSISTANCE FOR SPINAL PROCEDURE N/A 02/28/2017   Procedure: APPLICATION OF ROBOTIC ASSISTANCE FOR SPINAL PROCEDURE;  Surgeon: Ditty, Loura Halt, MD;  Location: [facility] OR;  Service: Neurosurgery;  Laterality: N/A;   cataract surgery Bilateral    CESAREAN SECTION     x2, 1968, 1972   COLON SURGERY  2018   hemi-colectomy resection   COLONOSCOPY WITH ESOPHAGOGASTRODUODENOSCOPY (EGD)     EPIDURAL BLOCK INJECTION     several times   ganglion cyst removed from throat     JOINT REPLACEMENT     LUMBAR PERCUTANEOUS PEDICLE SCREW 1  LEVEL N/A 02/28/2017   Procedure: LUMBAR PERCUTANEOUS PEDICLE SCREW LUMBAR FOUR-FIVE;  Surgeon: Ditty, Loura Halt, MD;  Location: [facility] OR;  Service: Neurosurgery;  Laterality: N/A;   OVARIAN CYST REMOVAL  1965   PARTIAL KNEE ARTHROPLASTY Left 04/14/2018   Procedure: UNICOMPARTMENTAL KNEE;  Surgeon: Teryl Lucy, MD;  Location: [facility] OR;  Service: Orthopedics;  Laterality: Left;   REPLACEMENT UNICONDYLAR JOINT KNEE Left 04/14/2018   TONSILLECTOMY AND ADENOIDECTOMY  1950   Patient Active Problem List   Diagnosis Date Noted   Mixed hyperlipidemia 11/28/2023   Cervical spondylosis 09/13/2019   Annual physical exam 08/16/2019   PCP notes >>>>>>>>>>>>>> 05/14/2018   Hyperglycemia 05/14/2018   Menopausal state 05/14/2018   Primary localized osteoarthritis of left knee 04/14/2018   S/P knee replacement 04/14/2018   Duodenal ulcer 04/10/2018   Glaucoma 03/30/2018   GERD (gastroesophageal reflux disease) 03/30/2018   S/P right colectomy 04/23/2017   Intractable headache 03/10/2017   Gait instability 03/10/2017   Generalized weakness 03/10/2017   Leukocytosis 03/10/2017   Hypothyroidism 03/10/2017   Opioid dependence (HCC) 03/10/2017   Spondylolisthesis of lumbosacral region 02/28/2017   S/P lumbar fusion 02/25/2017    PCP: Hyman Hopes  REFERRING PROVIDER: Hyman Hopes  REFERRING DIAG: Rotator cuff injury  THERAPY DIAG:  Cervicalgia  Chronic right shoulder pain  Muscle weakness (generalized)  Rationale for Evaluation and Treatment: Rehabilitation  ONSET DATE: 12/03/23  SUBJECTIVE:                                                                                                                                                                                      SUBJECTIVE STATEMENT:01/21/24:  still sore/ painful R upper traps. I think I maybe over did the stretch that you showed me for my levator? EVAL: I have more or less a recurrent kind of situation. It started years ago. I started  exercises for 2-3 years (a class) as it worked on the shoulder it got worse. It has been painful for about 2 months. Has noted some popping with lifting and externally rotating.   Hand dominance: Right  PERTINENT HISTORY: Hx of back surgery 2018 Partial knee 2019  PAIN:  Are you having pain? Yes: NPRS scale: 5/10 Pain location: neck and R shoulder  Pain description: can be both sharp/shooting and achy/dull Aggravating factors: activity, lifting, overhead lifting  Relieving factors: homeopathic and rest   PRECAUTIONS: None  RED FLAGS: None   WEIGHT BEARING RESTRICTIONS: No  FALLS:  Has patient fallen in last 6 months? No  LIVING ENVIRONMENT: Lives with: lives with their spouse Lives in: House/apartment Stairs: Yes: Internal: 15 steps; on right going up  PLOF: Independent  PATIENT GOALS:to stop this from interfering with what I want to do, because I want to stay as fit as I possibly can   NEXT MD VISIT:   OBJECTIVE:  Note: Objective measures were completed at Evaluation unless otherwise noted.  DIAGNOSTIC FINDINGS:  FINDINGS: There is no evidence of fracture or dislocation. Mildacromioclavicular osteoarthritis is noted. The glenohumeral joint appears normal. Soft tissues are unremarkable.   IMPRESSION: Mild acromioclavicular osteoarthritis.  Otherwise negative.  PATIENT SURVEYS:  Quick Dash 27.3  COGNITION: Overall cognitive status: Within functional limits for tasks assessed     SENSATION: WFL  POSTURE: No significant postural limitations   UPPER EXTREMITY ROM:   Active ROM Right eval Left eval  Shoulder flexion 140 "tight"   Shoulder extension    Shoulder abduction WNL   Shoulder adduction    Shoulder internal rotation WFL   Shoulder external rotation WFL "a little tight"    Elbow flexion    Elbow extension    Wrist flexion    Wrist extension    Wrist ulnar deviation    Wrist radial deviation    Wrist pronation    Wrist supination    (Blank  rows = not tested)  UPPER EXTREMITY MMT:  MMT Right eval Left  eval  Shoulder flexion 5/5   Shoulder extension    Shoulder abduction 4/5   Shoulder adduction    Shoulder internal rotation 4/5   Shoulder external rotation 4/5   Middle trapezius    Lower trapezius    Elbow flexion    Elbow extension    Wrist flexion    Wrist extension    Wrist ulnar deviation    Wrist radial deviation    Wrist pronation    Wrist supination    Grip strength (lbs)    (Blank rows = not tested)  SHOULDER SPECIAL TESTS: Impingement tests: Neer impingement test: negative, Hawkins/Kennedy impingement test: negative, and Painful arc test: negative Rotator cuff assessment: Drop arm test: negative, Empty can test: negative, and Full can test: negative  JOINT MOBILITY TESTING:  Able to get full PROM, only some tightness at end range flexion   PALPATION:  Some tightness in R UT                                                                                                                              TREATMENT DATE:  01/21/24:  Assessed cervical ROM briefly, no significant restriction for rotation ROM,  Palpation with point tender R levator , R upper traps, more laterally Manual and Neuromuscular re education:  Supine for stretching R upper traps, levator, with stabilization of R scapula, 3 bouts each, also contract/ relax for R levator. Gentle manual cervical distraction Prone for TPDN Trigger Point Dry Needling  Subsequent Treatment: Instructions provided previously at initial dry needling treatment.   Patient Verbal Consent Given: Yes Education Handout Provided: Yes Muscles Treated: R upper traps, 3 pts med to lat Electrical Stimulation Performed: No Treatment Response/Outcome: decreased tissue resistance noted R upper traps, pt voiced decreased pain  Gentle PA stretching post ribs, upper /middle thoracic spine   Seated for B shoulder rows, 20# 15 x 2 to improve scapulothoracic movement,  engage middle traps    01/14/24: Manual:  Supine for manual stretching , upper traps, levator scapulae, with manual depression of scapula, interspersed with contract/relax technique Supine upper cervical spine flexion with R rotation, contract/relax to improve ROM Prone for gentle PA pressure B upper ribs, mid and upper thoracic spine  Therapeutic activities: Reviewed and practiced prone rows with shoulder ER, to engage middle traps, utilized R shoulder and L shoulder, 22 reps R, 15 L Instructed in seated levator scapula stretch, with light isometric contract/relax, to relieve tension lower neck, B shoulders.   01/07/24:  Manual: supine for stretching R shoulder with lateral humeral glides, combined with R shoulder flexion.   Prone for gentle PA glides middle and upper ribs  Cross friction massage B upper traps, middle traps Trigger Point Dry Needling  Initial Treatment: Pt instructed on Dry Needling rational, procedures, and possible side effects. Pt instructed to expect mild to moderate muscle soreness later in the day and/or into the next day.  Pt instructed in methods to  reduce muscle soreness. Pt instructed to continue prescribed HEP. Because Dry Needling was performed over or adjacent to a lung field, pt was educated on S/S of pneumothorax and to seek immediate medical attention should they occur.  Patient was educated on signs and symptoms of infection and other risk factors and advised to seek medical attention should they occur.  Patient verbalized understanding of these instructions and education.   Patient Verbal Consent Given: Yes Education Handout Provided: Yes Muscles Treated: B upper traps 2 pts each Electrical Stimulation Performed: No Treatment Response/Outcome: twitch response B  Neuromuscular re education: Prone for MMT B middle traps, pain and notable loss of R scapular retraction movement on R as compare to L Instructed in prone rows with terminal ER, to isolate  middle traps, attempted R shoulder ext and R shoulder horizontal abd with ER, these ex did not effectively engage scapular musculature.so instructed to perform the prone rows with ER , 15 x daily at home Inst in child's pose with thread the needle to achieve posterior shoulder jt stretch, prior to her ex sessions at [facility] in pendulum R shoulder, trial with 4# wt, to utilize as well just prior to ex sessions.       12/29/23- EVAL   PATIENT EDUCATION: Education details: POC, exercises to avoid in her class  Person educated: Patient Education method: Explanation Education comprehension: verbalized understanding  HOME EXERCISE PROGRAM: Access Code: [email or web address] URL: [email or web address] Date: 01/14/2024 Prepared by: Caralee Ates  Exercises - Prone Shoulder Row with External Rotation with Dumbbell  - 1 x daily - 7 x weekly - 3 sets - 10 reps - Seated Levator Scapulae Stretch  - 1 x daily - 7 x weekly - 3 sets - 10 reps Access Code: [email or web address] URL: [email or web address] Date: 01/07/2024 Prepared by: Jalicia Roszak  Exercises - Prone Shoulder External Rotation  - 1 x daily - 7 x weekly - 3 sets - 10 reps - Circular Shoulder Pendulum with Table Support  - 1 x daily - 7 x weekly - 1 sets - 10 reps TBD  ASSESSMENT:  CLINICAL IMPRESSION: Patient is a 78 y.o. female who participated in physical therapy treatment today  for R shoulder pain and neck pain. Today resumed manual techniques to decrease muscle spasm, pain R upper traps and levator. She responded well to gentle techniques.   She should continue to benefit from skilled PT to address her neck and shoulder pain to be able to do her daily chores and continue working out without increases in pain.   OBJECTIVE IMPAIRMENTS: impaired UE functional use, improper body mechanics, and pain.   ACTIVITY LIMITATIONS: reach over head and hygiene/grooming  REHAB POTENTIAL: Excellent  CLINICAL DECISION MAKING:  Stable/uncomplicated  EVALUATION COMPLEXITY: Low  GOALS: Goals reviewed with patient? Yes  SHORT TERM GOALS: Target date: 01/26/24  Patient will be independent with initial HEP.  Baseline: TBD Goal status: INITIAL   LONG TERM GOALS: Target date: 02/23/24  Patient will be independent with advanced/ongoing HEP to improve outcomes and carryover.  Baseline:  Goal status: 01/21/23: progressing  2.  Patient will report 75% improvement in R shoulder and neck pain to improve QOL.  Baseline: 5/10 on average Goal status: INITIAL  3.  Patient will be able to participate in weight training class without pain provocation  Baseline: unable to do all the exercises d/t pain esp with barbell OHP  Goal status: INITIAL  4.  Patient will report 10 points improvement on QuickDash to demonstrate improved functional  ability.  Baseline: 27.3/100 oal status: INITIAL  PLAN:  PT FREQUENCY: 1x/week  PT DURATION: 8 weeks  PLANNED INTERVENTIONS: 97110-Therapeutic exercises, 97530- Therapeutic activity, [id number]- Neuromuscular re-education, 97535- Self Care, [id number]- Manual therapy, [id number]- Electrical stimulation (unattended), 97035- Ultrasound, 91478- Ionotophoresis 4mg /ml Dexamethasone, Patient/Family education, Taping, Dry Needling, Joint mobilization, Spinal mobilization, Cryotherapy, and Moist heat  PLAN FOR NEXT SESSION: manual therapy for neck. Shoulder strengthening in available ranges without pain. ? dry needling, how is neck, reassess shoulder strength and mechanics, progress with additional therex for scapulothoracic movement   Raydon Chappuis L Kyndra Condron, PT, DPT, OCS 01/21/2024, 5:06 PM +

## 2024-01-28 ENCOUNTER — Ambulatory Visit: Attending: Family Medicine

## 2024-01-28 ENCOUNTER — Other Ambulatory Visit: Payer: Self-pay

## 2024-01-28 DIAGNOSIS — G8929 Other chronic pain: Secondary | ICD-10-CM | POA: Diagnosis present

## 2024-01-28 DIAGNOSIS — M6281 Muscle weakness (generalized): Secondary | ICD-10-CM | POA: Insufficient documentation

## 2024-01-28 DIAGNOSIS — M25511 Pain in right shoulder: Secondary | ICD-10-CM | POA: Diagnosis present

## 2024-01-28 DIAGNOSIS — M542 Cervicalgia: Secondary | ICD-10-CM | POA: Diagnosis present

## 2024-01-28 NOTE — Therapy (Signed)
 OUTPATIENT PHYSICAL THERAPY SHOULDER TREATMENT   Patient Name: Ann Bell MRN: [id number] DOB:01/28/1946, 78 y.o., female Today's Date: 01/28/2024  END OF SESSION:  PT End of Session - 01/28/24 1345     Visit Number 5    Date for PT Re-Evaluation 02/23/24    Progress Note Due on Visit 10    PT Start Time 1345    PT Stop Time 1430    PT Time Calculation (min) 45 min    Activity Tolerance Patient tolerated treatment well    Behavior During Therapy [facility] for tasks assessed/performed                 Past Medical History:  Diagnosis Date   Anemia    Arthritis    Chronic back pain    spondylolisthesis   Glaucoma    Headache    History of blood transfusion    no abnormal reaction noted   History of colon polyps    benign   Hypothyroidism    takes NP Thyroid   Perforated ulcer (HCC)    Peripheral neuropathy    Pneumonia    hx of-yrs ago   Primary localized osteoarthritis of left knee 04/14/2018   Past Surgical History:  Procedure Laterality Date   ABDOMINAL HYSTERECTOMY  1973   still has one ovary   ANTERIOR LAT LUMBAR FUSION N/A 02/28/2017   Procedure: Lumbar four-five Transpsoas lateral interbody fusion with Lumbar four-five Percutaneous pedicle screw fixation, posterolateral arthrodesis with  Mazor ;  Surgeon: Ditty, Loura Halt, MD;  Location: [facility] OR;  Service: Neurosurgery;  Laterality: N/A;   APPENDECTOMY  1965   APPLICATION OF ROBOTIC ASSISTANCE FOR SPINAL PROCEDURE N/A 02/28/2017   Procedure: APPLICATION OF ROBOTIC ASSISTANCE FOR SPINAL PROCEDURE;  Surgeon: Ditty, Loura Halt, MD;  Location: [facility] OR;  Service: Neurosurgery;  Laterality: N/A;   cataract surgery Bilateral    CESAREAN SECTION     x2, 1968, 1972   COLON SURGERY  2018   hemi-colectomy resection   COLONOSCOPY WITH ESOPHAGOGASTRODUODENOSCOPY (EGD)     EPIDURAL BLOCK INJECTION     several times   ganglion cyst removed from throat     JOINT REPLACEMENT     LUMBAR PERCUTANEOUS PEDICLE SCREW 1  LEVEL N/A 02/28/2017   Procedure: LUMBAR PERCUTANEOUS PEDICLE SCREW LUMBAR FOUR-FIVE;  Surgeon: Ditty, Loura Halt, MD;  Location: [facility] OR;  Service: Neurosurgery;  Laterality: N/A;   OVARIAN CYST REMOVAL  1965   PARTIAL KNEE ARTHROPLASTY Left 04/14/2018   Procedure: UNICOMPARTMENTAL KNEE;  Surgeon: Teryl Lucy, MD;  Location: [facility] OR;  Service: Orthopedics;  Laterality: Left;   REPLACEMENT UNICONDYLAR JOINT KNEE Left 04/14/2018   TONSILLECTOMY AND ADENOIDECTOMY  1950   Patient Active Problem List   Diagnosis Date Noted   Mixed hyperlipidemia 11/28/2023   Cervical spondylosis 09/13/2019   Annual physical exam 08/16/2019   PCP notes >>>>>>>>>>>>>> 05/14/2018   Hyperglycemia 05/14/2018   Menopausal state 05/14/2018   Primary localized osteoarthritis of left knee 04/14/2018   S/P knee replacement 04/14/2018   Duodenal ulcer 04/10/2018   Glaucoma 03/30/2018   GERD (gastroesophageal reflux disease) 03/30/2018   S/P right colectomy 04/23/2017   Intractable headache 03/10/2017   Gait instability 03/10/2017   Generalized weakness 03/10/2017   Leukocytosis 03/10/2017   Hypothyroidism 03/10/2017   Opioid dependence (HCC) 03/10/2017   Spondylolisthesis of lumbosacral region 02/28/2017   S/P lumbar fusion 02/25/2017    PCP: Hyman Hopes  REFERRING PROVIDER: Hyman Hopes  REFERRING DIAG: Rotator cuff injury  THERAPY DIAG:  Cervicalgia  Chronic right shoulder pain  Muscle weakness (generalized)  Rationale for Evaluation and Treatment: Rehabilitation  ONSET DATE: 12/03/23  SUBJECTIVE:                                                                                                                                                                                      SUBJECTIVE STATEMENT:01/28/24:  felt  better for several days after initial visit then was cleaning up a lot, moving things around in closets.   EVAL: I have more or less a recurrent kind of situation. It started years ago. I  started exercises for 2-3 years (a class) as it worked on the shoulder it got worse. It has been painful for about 2 months. Has noted some popping with lifting and externally rotating.   Hand dominance: Right  PERTINENT HISTORY: Hx of back surgery 2018 Partial knee 2019  PAIN:  Are you having pain? Yes: NPRS scale: 5/10 Pain location: neck and R shoulder  Pain description: can be both sharp/shooting and achy/dull Aggravating factors: activity, lifting, overhead lifting  Relieving factors: homeopathic and rest   PRECAUTIONS: None  RED FLAGS: None   WEIGHT BEARING RESTRICTIONS: No  FALLS:  Has patient fallen in last 6 months? No  LIVING ENVIRONMENT: Lives with: lives with their spouse Lives in: House/apartment Stairs: Yes: Internal: 15 steps; on right going up  PLOF: Independent  PATIENT GOALS:to stop this from interfering with what I want to do, because I want to stay as fit as I possibly can   NEXT MD VISIT:   OBJECTIVE:  Note: Objective measures were completed at Evaluation unless otherwise noted.  DIAGNOSTIC FINDINGS:  FINDINGS: There is no evidence of fracture or dislocation. Mildacromioclavicular osteoarthritis is noted. The glenohumeral joint appears normal. Soft tissues are unremarkable.   IMPRESSION: Mild acromioclavicular osteoarthritis.  Otherwise negative.  PATIENT SURVEYS:  Quick Dash 27.3  COGNITION: Overall cognitive status: Within functional limits for tasks assessed     SENSATION: WFL  POSTURE: No significant postural limitations   UPPER EXTREMITY ROM:   Active ROM Right eval Left eval  Shoulder flexion 140 "tight"   Shoulder extension    Shoulder abduction WNL   Shoulder adduction    Shoulder internal rotation WFL   Shoulder external rotation WFL "a little tight"    Elbow flexion    Elbow extension    Wrist flexion    Wrist extension    Wrist ulnar deviation    Wrist radial deviation    Wrist pronation    Wrist supination     (Blank rows = not tested)  UPPER EXTREMITY MMT:  MMT Right eval  Left eval  Shoulder flexion 5/5   Shoulder extension    Shoulder abduction 4/5   Shoulder adduction    Shoulder internal rotation 4/5   Shoulder external rotation 4/5   Middle trapezius    Lower trapezius    Elbow flexion    Elbow extension    Wrist flexion    Wrist extension    Wrist ulnar deviation    Wrist radial deviation    Wrist pronation    Wrist supination    Grip strength (lbs)    (Blank rows = not tested)  SHOULDER SPECIAL TESTS: Impingement tests: Neer impingement test: negative, Hawkins/Kennedy impingement test: negative, and Painful arc test: negative Rotator cuff assessment: Drop arm test: negative, Empty can test: negative, and Full can test: negative  JOINT MOBILITY TESTING:  Able to get full PROM, only some tightness at end range flexion   PALPATION:  Some tightness in R UT                                                                                                                              TREATMENT DATE:  01/28/24:  Manual :  Cervical spine Rom limited to 80 degrees all directions, no notable deficit in any direction Tender center of R upper traps: Trigger Point Dry Needling  Subsequent Treatment: Instructions provided previously at initial dry needling treatment.   Patient Verbal Consent Given: Yes Education Handout Provided: Yes Muscles Treated: R upper traps Electrical Stimulation Performed: No Treatment Response/Outcome: decreased tissue resistance noted R upper traps, pt voiced decreased pain  Gentle PA stretching post ribs, upper /middle thoracic spine  Supine for stretching R upper traps, levator, with stabilization of R scapula, 3 bouts each, also contract/ relax for R levator. Gentle manual cervical distraction  Rows 15#, 20x  Chest press 5# 20 x  01/21/24:  Assessed cervical ROM briefly, no significant restriction for rotation ROM,  Palpation with point tender  R levator , R upper traps, more laterally Manual and Neuromuscular re education:  Supine for stretching R upper traps, levator, with stabilization of R scapula, 3 bouts each, also contract/ relax for R levator. Gentle manual cervical distraction Prone for TPDN Trigger Point Dry Needling  Subsequent Treatment: Instructions provided previously at initial dry needling treatment.   Patient Verbal Consent Given: Yes Education Handout Provided: Yes Muscles Treated: R upper traps, 3 pts med to lat Electrical Stimulation Performed: No Treatment Response/Outcome: decreased tissue resistance noted R upper traps, pt voiced decreased pain  Gentle PA stretching post ribs, upper /middle thoracic spine   Seated for B shoulder rows, 20# 15 x 2 to improve scapulothoracic movement, engage middle traps    01/14/24: Manual:  Supine for manual stretching , upper traps, levator scapulae, with manual depression of scapula, interspersed with contract/relax technique Supine upper cervical spine flexion with R rotation, contract/relax to improve ROM Prone for gentle PA pressure B upper ribs, mid and upper thoracic spine  Therapeutic  activities: Reviewed and practiced prone rows with shoulder ER, to engage middle traps, utilized R shoulder and L shoulder, 22 reps R, 15 L Instructed in seated levator scapula stretch, with light isometric contract/relax, to relieve tension lower neck, B shoulders.   01/07/24:  Manual: supine for stretching R shoulder with lateral humeral glides, combined with R shoulder flexion.   Prone for gentle PA glides middle and upper ribs  Cross friction massage B upper traps, middle traps Trigger Point Dry Needling  Initial Treatment: Pt instructed on Dry Needling rational, procedures, and possible side effects. Pt instructed to expect mild to moderate muscle soreness later in the day and/or into the next day.  Pt instructed in methods to reduce muscle soreness. Pt instructed to  continue prescribed HEP. Because Dry Needling was performed over or adjacent to a lung field, pt was educated on S/S of pneumothorax and to seek immediate medical attention should they occur.  Patient was educated on signs and symptoms of infection and other risk factors and advised to seek medical attention should they occur.  Patient verbalized understanding of these instructions and education.   Patient Verbal Consent Given: Yes Education Handout Provided: Yes Muscles Treated: B upper traps 2 pts each Electrical Stimulation Performed: No Treatment Response/Outcome: twitch response B  Neuromuscular re education: Prone for MMT B middle traps, pain and notable loss of R scapular retraction movement on R as compare to L Instructed in prone rows with terminal ER, to isolate middle traps, attempted R shoulder ext and R shoulder horizontal abd with ER, these ex did not effectively engage scapular musculature.so instructed to perform the prone rows with ER , 15 x daily at home Inst in child's pose with thread the needle to achieve posterior shoulder jt stretch, prior to her ex sessions at [facility] in pendulum R shoulder, trial with 4# wt, to utilize as well just prior to ex sessions.       12/29/23- EVAL   PATIENT EDUCATION: Education details: POC, exercises to avoid in her class  Person educated: Patient Education method: Explanation Education comprehension: verbalized understanding  HOME EXERCISE PROGRAM: Access Code: [email or web address] URL: [email or web address] Date: 01/14/2024 Prepared by: Caralee Ates  Exercises - Prone Shoulder Row with External Rotation with Dumbbell  - 1 x daily - 7 x weekly - 3 sets - 10 reps - Seated Levator Scapulae Stretch  - 1 x daily - 7 x weekly - 3 sets - 10 reps Access Code: [email or web address] URL: [email or web address] Date: 01/07/2024 Prepared by: Iris Tatsch  Exercises - Prone Shoulder External Rotation  - 1 x daily - 7 x weekly - 3 sets -  10 reps - Circular Shoulder Pendulum with Table Support  - 1 x daily - 7 x weekly - 1 sets - 10 reps TBD  ASSESSMENT:  CLINICAL IMPRESSION: Patient is a 78 y.o. female who participated in physical therapy treatment today  for R shoulder pain and neck pain. Today  continued manual techniques to decrease muscle spasm, pain R upper traps and levator. She responded well to gentle techniques. Also once more with TDN as she has responded well to this treatment so far.  She should continue to benefit from skilled PT to address her neck and shoulder pain to be able to do her daily chores and continue working out without increases in pain. We discussed possibly pacing remaining visits out every 2 weeks as she has started to maintain her improvement  OBJECTIVE IMPAIRMENTS: impaired UE functional use, improper body mechanics,  and pain.   ACTIVITY LIMITATIONS: reach over head and hygiene/grooming  REHAB POTENTIAL: Excellent  CLINICAL DECISION MAKING: Stable/uncomplicated  EVALUATION COMPLEXITY: Low  GOALS: Goals reviewed with patient? Yes  SHORT TERM GOALS: Target date: 01/26/24  Patient will be independent with initial HEP.  Baseline: TBD Goal status: INITIAL   LONG TERM GOALS: Target date: 02/23/24  Patient will be independent with advanced/ongoing HEP to improve outcomes and carryover.  Baseline:  Goal status: 01/21/23: progressing  2.  Patient will report 75% improvement in R shoulder and neck pain to improve QOL.  Baseline: 5/10 on average Goal status: INITIAL  3.  Patient will be able to participate in weight training class without pain provocation  Baseline: unable to do all the exercises d/t pain esp with barbell OHP  Goal status: INITIAL  4.  Patient will report 10 points improvement on QuickDash to demonstrate improved functional ability.  Baseline: 27.3/100 oal status: INITIAL  PLAN:  PT FREQUENCY: 1x/week  PT DURATION: 8 weeks  PLANNED INTERVENTIONS:  97110-Therapeutic exercises, 97530- Therapeutic activity, [id number]- Neuromuscular re-education, 97535- Self Care, 82956- Manual therapy, G0283- Electrical stimulation (unattended), 97035- Ultrasound, 21308- Ionotophoresis 4mg /ml Dexamethasone, Patient/Family education, Taping, Dry Needling, Joint mobilization, Spinal mobilization, Cryotherapy, and Moist heat  PLAN FOR NEXT SESSION: manual therapy for neck. Shoulder strengthening in available ranges without pain. ? dry needling, how is neck, reassess shoulder strength and mechanics, progress with additional therex for scapulothoracic movement   Faiga Stones L Dionel Archey, PT, DPT, OCS 01/28/2024, 5:06 PM +

## 2024-03-02 ENCOUNTER — Encounter: Payer: Self-pay | Admitting: Family Medicine

## 2024-04-08 LAB — HEMOGLOBIN A1C: Hemoglobin A1C: 5.6

## 2024-04-08 LAB — COMPREHENSIVE METABOLIC PANEL WITH GFR
Albumin: 4.5 (ref 3.5–5.0)
Calcium: 9.7 (ref 8.7–10.7)
Globulin: 2.5
eGFR: 83

## 2024-04-08 LAB — HEPATIC FUNCTION PANEL
ALT: 13 U/L (ref 7–35)
AST: 23 (ref 13–35)
Alkaline Phosphatase: 67 (ref 25–125)
Bilirubin, Total: 0.6

## 2024-04-08 LAB — BASIC METABOLIC PANEL WITH GFR
BUN: 13 (ref 4–21)
CO2: 22 (ref 13–22)
Chloride: 102 (ref 99–108)
Creatinine: 0.7 (ref 0.5–1.1)
Glucose: 95
Potassium: 4.7 meq/L (ref 3.5–5.1)
Sodium: 139 (ref 137–147)

## 2024-04-08 LAB — CBC AND DIFFERENTIAL
HCT: 50 — AB (ref 36–46)
Hemoglobin: 15.6 (ref 12.0–16.0)
Platelets: 184 K/uL (ref 150–400)
WBC: 7.8

## 2024-04-08 LAB — CBC: RBC: 5.27 — AB (ref 3.87–5.11)

## 2024-04-08 LAB — TSH: TSH: 0.37 — AB (ref 0.41–5.90)

## 2024-04-08 LAB — LIPID PANEL
Cholesterol: 230 — AB (ref 0–200)
HDL: 56 (ref 35–70)
LDL Cholesterol: 153
Triglycerides: 117 (ref 40–160)

## 2024-06-16 ENCOUNTER — Encounter: Payer: Self-pay | Admitting: Neurology

## 2024-06-16 LAB — SEDIMENTATION RATE: Sedimentation Rate-Westergren: 2

## 2024-06-16 LAB — FSH/LH
FSH: 36
LH: 47.4

## 2024-06-16 LAB — T3, FREE: T3, Free: 3

## 2024-06-16 LAB — THYROXINE (T4) FREE, DIRECT: T4,Free (Direct): 1.16

## 2024-06-16 LAB — CORTISOL: Cortisol: 9.9

## 2024-06-16 LAB — TESTOSTERONE, FREE, DIRECT: Free Testosterone, Direct: 4.4

## 2024-06-16 LAB — DHEA-SULFATE: DHEA-Sulfate, Serum: 115

## 2024-06-16 LAB — TESTOSTERONE: Testosterone: 111

## 2024-06-16 LAB — INSULIN, FREE: Insulin, Free: 7

## 2024-06-16 LAB — T3, REVERSE: T3, Reverse: 17.8

## 2024-06-16 LAB — ESTROGENS, TOTAL: Estrogen: 173

## 2024-06-16 LAB — SEX HORMONE BINDING GLOBULIN: Sex Horm Binding Glob, Serum: 27.3

## 2024-06-16 LAB — ESTRADIOL: Estradiol: 46.6

## 2024-06-16 LAB — PROGESTERONE: Progesterone: 2.6

## 2024-07-09 ENCOUNTER — Telehealth: Payer: Self-pay

## 2024-07-09 MED ORDER — COVID-19 MRNA VAC-TRIS(PFIZER) 30 MCG/0.3ML IM SUSY: 0.3000 mL | PREFILLED_SYRINGE | Freq: Once | INTRAMUSCULAR | 0 refills | Status: AC

## 2024-07-09 NOTE — Telephone Encounter (Signed)
 Copied from CRM #[id number]. Topic: Clinical - Request for Lab/Test Order >> Jul 09, 2024  1:57 PM Franky GRADE wrote: Reason for CRM: Patient is requesting an order for the Covid Vaccine to be sent to [name] [address] [place], [place] [address].

## 2024-07-09 NOTE — Telephone Encounter (Signed)
 Spoke w/ [name] rx sent to [name] as requested. Appt scheduled w/ [name].

## 2024-09-30 ENCOUNTER — Ambulatory Visit: Admitting: Family Medicine

## 2024-09-30 ENCOUNTER — Encounter: Payer: Self-pay | Admitting: Family Medicine

## 2024-09-30 VITALS — BP 146/69 | HR 78 | Ht 63.0 in | Wt 126.0 lb

## 2024-09-30 DIAGNOSIS — Z0000 Encounter for general adult medical examination without abnormal findings: Secondary | ICD-10-CM | POA: Diagnosis not present

## 2024-09-30 DIAGNOSIS — R739 Hyperglycemia, unspecified: Secondary | ICD-10-CM | POA: Diagnosis not present

## 2024-09-30 DIAGNOSIS — E782 Mixed hyperlipidemia: Secondary | ICD-10-CM | POA: Diagnosis not present

## 2024-09-30 DIAGNOSIS — E039 Hypothyroidism, unspecified: Secondary | ICD-10-CM
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
## 2024-10-04 ENCOUNTER — Ambulatory Visit: Admitting: *Deleted

## 2024-10-04 VITALS — Ht 63.0 in | Wt 126.0 lb

## 2024-10-04 DIAGNOSIS — Z0000 Encounter for general adult medical examination without abnormal findings: Secondary | ICD-10-CM

## 2024-10-04 DIAGNOSIS — M8580 Other specified disorders of bone density and structure, unspecified site: Secondary | ICD-10-CM | POA: Diagnosis not present

## 2024-10-04 DIAGNOSIS — Z780 Asymptomatic menopausal state: Secondary | ICD-10-CM | POA: Diagnosis not present

## 2024-10-04 NOTE — Patient Instructions (Addendum)
 Ann Bell,  Thank you for taking the time for your Medicare Wellness Visit. I appreciate your continued commitment to your health goals. Please review the care plan we discussed, and feel free to reach out if I can assist you further.  Please note that Annual Wellness Visits do not include a physical exam. Some assessments may be limited, especially if the visit was conducted virtually. If needed, we may recommend an in-person follow-up with your provider.  GOAL: To get to the point that I can move my head without pain.   Ongoing Care Seeing your primary care provider every 3 to 6 months helps us  monitor your health and provide consistent, personalized care.   [name] Mon, NP: Please call us  to schedule a physical after 09/30/25.  Medicare AWV:   Your husband wanted to schedule both of your visits for the same date so I set yours up for 10/06/25 at 1pm, telephone visit.   Referrals If a referral was made during today's visit and you haven't received any updates within two weeks, please contact the referred provider directly to check on the status.  Bone Density ([facility]):  [phone]  You will need to get the following vaccines at your local pharmacy:  Your covid vaccine is not due at this time as you just got a bootster in October.  Health Maintenance  Topic Date Due   Medicare Annual Wellness Visit  03/12/2024   COVID-19 Vaccine (10 - 2025-26 season) 09/29/2025*   DTaP/Tdap/Td vaccine (2 - Tdap) 08/13/2028   Pneumococcal Vaccine for age over 13  Completed   Flu Shot  Completed   Osteoporosis screening with Bone Density Scan  Completed   Zoster (Shingles) Vaccine  Completed   Meningitis B Vaccine  Aged Out   Colon Cancer Screening  Discontinued   Hepatitis C Screening  Discontinued  *Topic was postponed. The date shown is not the original due date.       09/29/2024   11:36 AM  Advanced Directives  Does Patient Have a Medical Advance Directive? Yes  Type of  Estate Agent of [place];Living will  Does patient want to make changes to medical advance directive? No - Patient declined  Copy of Healthcare Power of Attorney in Chart? No - copy requested  Would patient like information on creating a medical advance directive? No - Patient declined  Once completed and notarized, you may return a copy of your Advanced Directive(s) by either of the following:  Bring a copy of your health care power of attorney and living will to the office to be added to your chart at your convenience. You can mail a copy to [facility] [address] [place], [place] [address] or email to [email or web address]   Vision: Annual vision screenings are recommended for early detection of glaucoma, cataracts, and diabetic retinopathy. These exams can also reveal signs of chronic conditions such as diabetes and high blood pressure.  Dental: Annual dental screenings help detect early signs of oral cancer, gum disease, and other conditions linked to overall health, including heart disease and diabetes.  Please see the attached documents for additional preventive care recommendations.

## 2024-10-04 NOTE — Progress Notes (Signed)
 Chief Complaint  Patient presents with   Medicare Wellness     Subjective:   Ann Bell is a 77 y.o. female who presents for a Medicare Annual Wellness Visit.  Visit info / Clinical Intake: Medicare Wellness Visit Type:: Subsequent Annual Wellness Visit Persons participating in visit and providing information:: patient Medicare Wellness Visit Mode:: Telephone If telephone:: video declined Since this visit was completed virtually, some vitals may be partially provided or unavailable. Missing vitals are due to the limitations of the virtual format.: Unable to obtain vitals - no equipment If Telephone or Video please confirm:: I connected with patient using audio/video enable telemedicine. I verified patient identity with two identifiers, discussed telehealth limitations, and patient agreed to proceed. Patient Location:: home Provider Location:: office Interpreter Needed?: No Pre-visit prep was completed: yes AWV questionnaire completed by patient prior to visit?: yes Date:: 09/29/24 Living arrangements:: (Patient-Rptd) lives with spouse/significant other Patient's Overall Health Status Rating: (Patient-Rptd) very good Typical amount of pain: (Patient-Rptd) some Does pain affect daily life?: (Patient-Rptd) no Are you currently prescribed opioids?: no  Dietary Habits and Nutritional Risks How many meals a day?: (Patient-Rptd) 3 Eats fruit and vegetables daily?: (Patient-Rptd) yes Most meals are obtained by: (Patient-Rptd) preparing own meals In the last 2 weeks, have you had any of the following?: none Diabetic:: no  Functional Status Activities of Daily Living (to include ambulation/medication): (Patient-Rptd) Independent Ambulation: (Patient-Rptd) Independent Medication Administration: (Patient-Rptd) Independent Home Management (perform basic housework or laundry): (Patient-Rptd) Independent Manage your own finances?: (Patient-Rptd) yes Primary transportation is:  (Patient-Rptd) driving Concerns about vision?: no *vision screening is required for [facility]* (up to date with Dr Camillo) Concerns about hearing?: no  Fall Screening Falls in the past year?: 0 Number of falls in past year: 0 Was there an injury with Fall?: 0 Fall Risk Category Calculator: 0 Patient Fall Risk Level: Low Fall Risk  Fall Risk Patient at Risk for Falls Due to: No Fall Risks Fall risk Follow up: Falls evaluation completed  Home and Transportation Safety: All rugs have non-skid backing?: (Patient-Rptd) yes All stairs or steps have railings?: (Patient-Rptd) yes Grab bars in the bathtub or shower?: (!) (Patient-Rptd) no Have non-skid surface in bathtub or shower?: (Patient-Rptd) yes Good home lighting?: (Patient-Rptd) yes Regular seat belt use?: (Patient-Rptd) yes Hospital stays in the last year:: (Patient-Rptd) no  Cognitive Assessment Difficulty concentrating, remembering, or making decisions? : (Patient-Rptd) no Will 6CIT or Mini Cog be Completed: yes What year is it?: 0 points What month is it?: 0 points Give patient an address phrase to remember (5 components): [address] [place]  About what time is it?: 0 points Count backwards from 20 to 1: 0 points Say the months of the year in reverse: 0 points Repeat the address phrase from earlier: 2 points 6 CIT Score: 2 points  Advance Directives (For Healthcare) Does Patient Have a Medical Advance Directive?: Yes Does patient want to make changes to medical advance directive?: No - Patient declined Type of Advance Directive: Healthcare Power of [place]; Living will Copy of Healthcare Power of Attorney in Chart?: No - copy requested Copy of Living Will in Chart?: No - copy requested Would patient like information on creating a medical advance directive?: No - Patient declined  Reviewed/Updated  Reviewed/Updated: Reviewed All (Medical, Surgical, Family, Medications, Allergies, Care Teams, Patient  Goals)    Allergies (verified) Patient has no known allergies.   Current Medications (verified) Outpatient Encounter Medications as of 10/04/2024  Medication Sig  latanoprost  (XALATAN ) 0.005 % ophthalmic solution Place 1 drop into both eyes at bedtime.   levothyroxine  (SYNTHROID ) 75 MCG tablet Take 1 tablet (75 mcg total) by mouth daily.   Multiple Vitamin (MULTIVITAMIN WITH MINERALS) TABS tablet Take 1 tablet by mouth daily.   NALTREXONE  HCL PO Take 4.5 mg by mouth daily.   Nutritional Supplements (DHEA PO) Take 1 capsule by mouth daily.   progesterone  (PROMETRIUM ) 100 MG capsule Take 100 mg by mouth daily.   Testosterone  20 % CREA 0.25 mg by Does not apply route daily.   No facility-administered encounter medications on file as of 10/04/2024.    History: Past Medical History:  Diagnosis Date   Anemia    Arthritis    Cataract    Chronic back pain    spondylolisthesis   Glaucoma    Headache    History of blood transfusion    no abnormal reaction noted   History of colon polyps    benign   Hypothyroidism    takes NP Thyroid    Perforated ulcer (HCC)    Peripheral neuropathy    Pneumonia    hx of-yrs ago   Primary localized osteoarthritis of left knee 04/14/2018   Past Surgical History:  Procedure Laterality Date   ABDOMINAL HYSTERECTOMY  1973   still has one ovary   ANTERIOR LAT LUMBAR FUSION N/A 02/28/2017   Procedure: Lumbar four-five Transpsoas lateral interbody fusion with Lumbar four-five Percutaneous pedicle screw fixation, posterolateral arthrodesis with  Mazor ;  Surgeon: Ditty, Morene Hicks, MD;  Location: [facility] OR;  Service: Neurosurgery;  Laterality: N/A;   APPENDECTOMY  1965   APPLICATION OF ROBOTIC ASSISTANCE FOR SPINAL PROCEDURE N/A 02/28/2017   Procedure: APPLICATION OF ROBOTIC ASSISTANCE FOR SPINAL PROCEDURE;  Surgeon: Ditty, Morene Hicks, MD;  Location: [facility] OR;  Service: Neurosurgery;  Laterality: N/A;   cataract surgery Bilateral    CESAREAN SECTION      x2, 1968, 1972   COLON SURGERY  2018   hemi-colectomy resection   COLONOSCOPY WITH ESOPHAGOGASTRODUODENOSCOPY (EGD)     EPIDURAL BLOCK INJECTION     several times   EYE SURGERY  2016   ganglion cyst removed from throat     JOINT REPLACEMENT     LUMBAR PERCUTANEOUS PEDICLE SCREW 1 LEVEL N/A 02/28/2017   Procedure: LUMBAR PERCUTANEOUS PEDICLE SCREW LUMBAR FOUR-FIVE;  Surgeon: Ditty, Morene Hicks, MD;  Location: [facility] OR;  Service: Neurosurgery;  Laterality: N/A;   OVARIAN CYST REMOVAL  1965   PARTIAL KNEE ARTHROPLASTY Left 04/14/2018   Procedure: UNICOMPARTMENTAL KNEE;  Surgeon: Josefina Chew, MD;  Location: [facility] OR;  Service: Orthopedics;  Laterality: Left;   REPLACEMENT UNICONDYLAR JOINT KNEE Left 04/14/2018   SPINE SURGERY  2018   TONSILLECTOMY AND ADENOIDECTOMY  1950   Family History  Problem Relation Age of Onset   Diabetes Mother    Breast cancer Maternal Grandmother    Liver disease Paternal Grandfather    Hearing loss Father    Colon cancer Neg Hx    CAD Neg Hx    Social History   Occupational History   Occupation: n/a  Tobacco Use   Smoking status: Former    Current packs/day: 0.00    Average packs/day: 0.5 packs/day for 20.0 years (10.0 ttl pk-yrs)    Types: Cigarettes    Start date: 06/07/1958    Quit date: 06/07/1978    Years since quitting: 46.3   Smokeless tobacco: Never  Vaping Use   Vaping status: Never Used  Substance  and Sexual Activity   Alcohol use: Yes    Comment: socially    Drug use: No   Sexual activity: Not Currently   Tobacco Counseling Counseling given: Not Answered  SDOH Screenings   Food Insecurity: No Food Insecurity (09/29/2024)  Housing: Low Risk  (09/29/2024)  Transportation Needs: No Transportation Needs (10/04/2024)  Utilities: Not At Risk (10/04/2024)  Alcohol Screen: Low Risk  (09/29/2024)  Depression (PHQ2-9): Low Risk  (09/30/2024)  Financial Resource Strain: Low Risk  (09/29/2024)  Physical Activity: Sufficiently Active  (09/29/2024)  Social Connections: Socially Integrated (09/29/2024)  Stress: No Stress Concern Present (09/29/2024)  Tobacco Use: Medium Risk (10/04/2024)   See flowsheets for full screening details  Depression Screen PHQ 2 & 9 Depression Scale- Over the past 2 weeks, how often have you been bothered by any of the following problems? Little interest or pleasure in doing things: 0 Feeling down, depressed, or hopeless (PHQ Adolescent also includes...irritable): 0 PHQ-2 Total Score: 0 Trouble falling or staying asleep, or sleeping too much: 0 Feeling tired or having little energy: 0 Poor appetite or overeating (PHQ Adolescent also includes...weight loss): 0 Feeling bad about yourself - or that you are a failure or have let yourself or your family down: 0 Trouble concentrating on things, such as reading the newspaper or watching television (PHQ Adolescent also includes...like school work): 0 Moving or speaking so slowly that other people could have noticed. Or the opposite - being so fidgety or restless that you have been moving around a lot more than usual: 0 Thoughts that you would be better off dead, or of hurting yourself in some way: 0 PHQ-9 Total Score: 0 If you checked off any problems, how difficult have these problems made it for you to do your work, take care of things at home, or get along with other people?: Not difficult at all     Goals Addressed             This Visit's Progress    COMPLETED: Patient Stated       Continue sessions with personal trainer     To get to the point that I can move my head without pain. Currently under treatment with pain management.               Objective:    Today's Vitals   10/04/24 1304  Weight: 126 lb (57.2 kg)  Height: 5' 3 (1.6 m)   Body mass index is 22.32 kg/m.  Hearing/Vision screen No results found. Immunizations and Health Maintenance Health Maintenance  Topic Date Due   COVID-19 Vaccine (11 - 2025-26 season)  09/29/2025 (Originally 10/06/2024)   Medicare Annual Wellness (AWV)  10/04/2025   DTaP/Tdap/Td (2 - Tdap) 08/13/2028   Pneumococcal Vaccine: 50+ Years  Completed   Influenza Vaccine  Completed   Bone Density Scan  Completed   Zoster Vaccines- Shingrix  Completed   Meningococcal B Vaccine  Aged Out   Colonoscopy  Discontinued   Hepatitis C Screening  Discontinued        Assessment/Plan:  This is a routine wellness examination for Ann Bell.  Patient Care Team: Almarie [name] NOVAK, NP as PCP - General (Family Medicine) Josefina Chew, MD as Consulting Physician (Orthopedic Surgery) Camillo Golas, MD as Consulting Physician (Ophthalmology) Charley Skippy HERO, NP as Nurse Practitioner  I have personally reviewed and noted the following in the patient's chart:   Medical and social history Use of alcohol, tobacco or illicit drugs  Current medications and supplements  including opioid prescriptions. Functional ability and status Nutritional status Physical activity Advanced directives List of other physicians Hospitalizations, surgeries, and ER visits in previous 12 months Vitals Screenings to include cognitive, depression, and falls Referrals and appointments  Orders Placed This Encounter  Procedures   DG Bone Density    Standing Status:   Future    Expected Date:   10/04/2024    Expiration Date:   10/04/2025    Reason for Exam (SYMPTOM  OR DIAGNOSIS REQUIRED):   postmenopausal estrogen deficiency, osteopenia    Preferred imaging location?:   [facility]   In addition, I have reviewed and discussed with patient certain preventive protocols, quality metrics, and best practice recommendations. A written personalized care plan for preventive services as well as general preventive health recommendations were provided to patient.   Lolita Libra, CMA   10/04/2024   Return in 1 year (on 10/04/2025).  After Visit Summary: (MyChart) Due to this being a telephonic visit, the after  visit summary with patients personalized plan was offered to patient via MyChart   Nurse Notes: nothing significant to report

## 2024-10-13 ENCOUNTER — Ambulatory Visit

## 2024-10-13 DIAGNOSIS — I10 Essential (primary) hypertension: Secondary | ICD-10-CM | POA: Diagnosis not present

## 2024-10-13 NOTE — Progress Notes (Signed)
 Patient is here for Blood Pressure check per [name] Mon, NP BP elevated today on recheck. Recommend f/u in 2-3 weeks for BP check. Return in about 2 weeks (around 10/14/2024) for BP check with nurse.  Patient currently takes: No blood pressure medications   BP today @ = 116/76 HR =75bpm PO2 = 98%  BP today @ = 116/72 HR = 68bpm PO2 =98%  Patient advised per [name] Mon, NP. Will make f/u appointment with provider.

## 2024-10-13 NOTE — Addendum Note (Signed)
 Addended by: VALLI TILLMAN BROCKS on: 10/13/2024 02:47 PM   Modules accepted: Level of Service
